# Patient Record
Sex: Female | Born: 1935
Health system: Southern US, Community
[De-identification: ages and names within clinical notes are randomized; demographics above are authoritative.]

## PROBLEM LIST (undated history)

## (undated) DIAGNOSIS — I509 Heart failure, unspecified: Secondary | ICD-10-CM

## (undated) DIAGNOSIS — C9110 Chronic lymphocytic leukemia of B-cell type not having achieved remission: Secondary | ICD-10-CM

## (undated) DIAGNOSIS — M25562 Pain in left knee: Secondary | ICD-10-CM

## (undated) DIAGNOSIS — I10 Essential (primary) hypertension: Secondary | ICD-10-CM

## (undated) DIAGNOSIS — D801 Nonfamilial hypogammaglobulinemia: Secondary | ICD-10-CM

## (undated) DIAGNOSIS — M1990 Unspecified osteoarthritis, unspecified site: Secondary | ICD-10-CM

## (undated) DIAGNOSIS — Z96659 Presence of unspecified artificial knee joint: Secondary | ICD-10-CM

## (undated) HISTORY — PX: CHOLECYSTECTOMY: SHX55

## (undated) HISTORY — DX: Chronic lymphocytic leukemia of B-cell type not having achieved remission: C91.10

## (undated) HISTORY — PX: APPENDECTOMY: SHX54

## (undated) HISTORY — PX: HERNIA REPAIR: SHX51

## (undated) HISTORY — DX: Nonfamilial hypogammaglobulinemia: D80.1

## (undated) HISTORY — PX: COLON SURGERY: SHX602

## (undated) HISTORY — PX: OTHER SURGICAL HISTORY: SHX169

---

## 1998-04-30 ENCOUNTER — Ambulatory Visit (HOSPITAL_BASED_OUTPATIENT_CLINIC_OR_DEPARTMENT_OTHER): Admission: RE | Admit: 1998-04-30 | Discharge: 1998-04-30 | Payer: Self-pay | Admitting: Orthopedic Surgery

## 1998-11-11 ENCOUNTER — Encounter: Payer: Self-pay | Admitting: Family Medicine

## 1998-11-11 ENCOUNTER — Ambulatory Visit (HOSPITAL_COMMUNITY): Admission: RE | Admit: 1998-11-11 | Discharge: 1998-11-11 | Payer: Self-pay | Admitting: Family Medicine

## 1999-07-27 ENCOUNTER — Encounter (INDEPENDENT_AMBULATORY_CARE_PROVIDER_SITE_OTHER): Payer: Self-pay | Admitting: Specialist

## 1999-07-27 ENCOUNTER — Ambulatory Visit (HOSPITAL_BASED_OUTPATIENT_CLINIC_OR_DEPARTMENT_OTHER): Admission: RE | Admit: 1999-07-27 | Discharge: 1999-07-27 | Payer: Self-pay | Admitting: Orthopedic Surgery

## 1999-10-27 ENCOUNTER — Encounter (INDEPENDENT_AMBULATORY_CARE_PROVIDER_SITE_OTHER): Payer: Self-pay

## 1999-10-27 ENCOUNTER — Ambulatory Visit (HOSPITAL_COMMUNITY): Admission: RE | Admit: 1999-10-27 | Discharge: 1999-10-27 | Payer: Self-pay | Admitting: Gastroenterology

## 2000-01-13 ENCOUNTER — Ambulatory Visit (HOSPITAL_COMMUNITY): Admission: RE | Admit: 2000-01-13 | Discharge: 2000-01-13 | Payer: Self-pay | Admitting: Gastroenterology

## 2000-03-11 ENCOUNTER — Encounter: Payer: Self-pay | Admitting: Family Medicine

## 2000-03-11 ENCOUNTER — Encounter: Admission: RE | Admit: 2000-03-11 | Discharge: 2000-03-11 | Payer: Self-pay | Admitting: Family Medicine

## 2000-09-08 ENCOUNTER — Encounter: Payer: Self-pay | Admitting: Surgery

## 2000-09-08 ENCOUNTER — Encounter: Payer: Self-pay | Admitting: Family Medicine

## 2000-09-08 ENCOUNTER — Inpatient Hospital Stay (HOSPITAL_COMMUNITY): Admission: RE | Admit: 2000-09-08 | Discharge: 2000-09-14 | Payer: Self-pay | Admitting: Family Medicine

## 2000-09-08 ENCOUNTER — Encounter (INDEPENDENT_AMBULATORY_CARE_PROVIDER_SITE_OTHER): Payer: Self-pay | Admitting: Specialist

## 2001-03-29 HISTORY — PX: BLEPHAROPLASTY: SUR158

## 2001-03-29 HISTORY — PX: ORIF HIP FRACTURE: SHX2125

## 2001-04-03 ENCOUNTER — Ambulatory Visit (HOSPITAL_COMMUNITY): Admission: RE | Admit: 2001-04-03 | Discharge: 2001-04-03 | Payer: Self-pay | Admitting: Family Medicine

## 2001-04-03 ENCOUNTER — Encounter: Payer: Self-pay | Admitting: Family Medicine

## 2001-04-18 ENCOUNTER — Ambulatory Visit (HOSPITAL_COMMUNITY): Admission: RE | Admit: 2001-04-18 | Discharge: 2001-04-18 | Payer: Self-pay | Admitting: Family Medicine

## 2001-05-23 ENCOUNTER — Encounter: Admission: RE | Admit: 2001-05-23 | Discharge: 2001-05-23 | Payer: Self-pay | Admitting: Family Medicine

## 2001-05-23 ENCOUNTER — Encounter: Payer: Self-pay | Admitting: Family Medicine

## 2001-06-06 ENCOUNTER — Ambulatory Visit (HOSPITAL_COMMUNITY): Admission: RE | Admit: 2001-06-06 | Discharge: 2001-06-06 | Payer: Self-pay | Admitting: Ophthalmology

## 2001-06-14 ENCOUNTER — Inpatient Hospital Stay (HOSPITAL_COMMUNITY): Admission: EM | Admit: 2001-06-14 | Discharge: 2001-06-19 | Payer: Self-pay | Admitting: Emergency Medicine

## 2001-06-14 ENCOUNTER — Encounter: Payer: Self-pay | Admitting: Emergency Medicine

## 2001-06-14 ENCOUNTER — Encounter: Payer: Self-pay | Admitting: Orthopedic Surgery

## 2001-06-19 ENCOUNTER — Inpatient Hospital Stay (HOSPITAL_COMMUNITY)
Admission: RE | Admit: 2001-06-19 | Discharge: 2001-06-27 | Payer: Self-pay | Admitting: Physical Medicine & Rehabilitation

## 2001-11-22 ENCOUNTER — Ambulatory Visit (HOSPITAL_BASED_OUTPATIENT_CLINIC_OR_DEPARTMENT_OTHER): Admission: RE | Admit: 2001-11-22 | Discharge: 2001-11-22 | Payer: Self-pay | Admitting: Orthopedic Surgery

## 2001-11-22 ENCOUNTER — Encounter: Payer: Self-pay | Admitting: Orthopedic Surgery

## 2002-10-11 ENCOUNTER — Other Ambulatory Visit: Admission: RE | Admit: 2002-10-11 | Discharge: 2002-10-11 | Payer: Self-pay | Admitting: Gynecology

## 2002-10-18 ENCOUNTER — Encounter: Payer: Self-pay | Admitting: Family Medicine

## 2002-10-18 ENCOUNTER — Ambulatory Visit (HOSPITAL_COMMUNITY): Admission: RE | Admit: 2002-10-18 | Discharge: 2002-10-18 | Payer: Self-pay | Admitting: Unknown Physician Specialty

## 2002-10-23 ENCOUNTER — Encounter: Payer: Self-pay | Admitting: Family Medicine

## 2002-10-23 ENCOUNTER — Encounter: Admission: RE | Admit: 2002-10-23 | Discharge: 2002-10-23 | Payer: Self-pay | Admitting: Family Medicine

## 2002-12-26 ENCOUNTER — Encounter: Payer: Self-pay | Admitting: Orthopedic Surgery

## 2003-01-01 ENCOUNTER — Encounter: Payer: Self-pay | Admitting: Orthopedic Surgery

## 2003-01-01 ENCOUNTER — Ambulatory Visit (HOSPITAL_COMMUNITY): Admission: RE | Admit: 2003-01-01 | Discharge: 2003-01-01 | Payer: Self-pay | Admitting: Orthopedic Surgery

## 2003-01-02 ENCOUNTER — Observation Stay (HOSPITAL_COMMUNITY): Admission: RE | Admit: 2003-01-02 | Discharge: 2003-01-03 | Payer: Self-pay | Admitting: Orthopedic Surgery

## 2003-10-23 ENCOUNTER — Other Ambulatory Visit: Admission: RE | Admit: 2003-10-23 | Discharge: 2003-10-23 | Payer: Self-pay | Admitting: Gynecology

## 2004-09-08 ENCOUNTER — Encounter: Admission: RE | Admit: 2004-09-08 | Discharge: 2004-09-08 | Payer: Self-pay | Admitting: Family Medicine

## 2006-03-29 HISTORY — PX: TOTAL KNEE ARTHROPLASTY: SHX125

## 2006-10-26 ENCOUNTER — Inpatient Hospital Stay (HOSPITAL_COMMUNITY): Admission: RE | Admit: 2006-10-26 | Discharge: 2006-10-31 | Payer: Self-pay | Admitting: Orthopedic Surgery

## 2008-02-06 ENCOUNTER — Ambulatory Visit: Payer: Self-pay | Admitting: Hematology & Oncology

## 2008-02-28 ENCOUNTER — Other Ambulatory Visit: Admission: RE | Admit: 2008-02-28 | Discharge: 2008-02-28 | Payer: Self-pay | Admitting: Hematology & Oncology

## 2008-02-28 ENCOUNTER — Encounter: Payer: Self-pay | Admitting: Hematology & Oncology

## 2008-02-28 LAB — CHCC SATELLITE - SMEAR

## 2008-02-28 LAB — CBC WITH DIFFERENTIAL (CANCER CENTER ONLY)
BASO#: 0.3 10*3/uL — ABNORMAL HIGH (ref 0.0–0.2)
HCT: 37.9 % (ref 34.8–46.6)
HGB: 12.7 g/dL (ref 11.6–15.9)
LYMPH#: 15.3 10*3/uL — ABNORMAL HIGH (ref 0.9–3.3)
LYMPH%: 76.5 % — ABNORMAL HIGH (ref 14.0–48.0)
MCHC: 33.4 g/dL (ref 32.0–36.0)
MCV: 83 fL (ref 81–101)
MONO#: 0.7 10*3/uL (ref 0.1–0.9)
NEUT%: 17.4 % — ABNORMAL LOW (ref 39.6–80.0)
RDW: 12.3 % (ref 10.5–14.6)
WBC: 20 10*3/uL — ABNORMAL HIGH (ref 3.9–10.0)

## 2008-03-01 LAB — SPEP & IFE WITH QIG
Albumin ELP: 64.2 % (ref 55.8–66.1)
Alpha-1-Globulin: 4.3 % (ref 2.9–4.9)
Beta 2: 4.1 % (ref 3.2–6.5)
Beta Globulin: 5.9 % (ref 4.7–7.2)
IgA: 79 mg/dL (ref 68–378)
Total Protein, Serum Electrophoresis: 6.3 g/dL (ref 6.0–8.3)

## 2008-03-01 LAB — COMPREHENSIVE METABOLIC PANEL
Albumin: 4.2 g/dL (ref 3.5–5.2)
Alkaline Phosphatase: 78 U/L (ref 39–117)
BUN: 23 mg/dL (ref 6–23)
CO2: 28 mEq/L (ref 19–32)
Calcium: 10.1 mg/dL (ref 8.4–10.5)
Chloride: 105 mEq/L (ref 96–112)
Glucose, Bld: 109 mg/dL — ABNORMAL HIGH (ref 70–99)
Potassium: 4.6 mEq/L (ref 3.5–5.3)
Sodium: 140 mEq/L (ref 135–145)
Total Protein: 6.3 g/dL (ref 6.0–8.3)

## 2008-03-01 LAB — BETA 2 MICROGLOBULIN, SERUM: Beta-2 Microglobulin: 2.5 mg/L — ABNORMAL HIGH (ref 1.01–1.73)

## 2008-03-01 LAB — LACTATE DEHYDROGENASE: LDH: 148 U/L (ref 94–250)

## 2008-03-01 LAB — DIRECT ANTIGLOBULIN TEST (NOT AT ARMC): DAT IgG: NEGATIVE

## 2008-03-01 LAB — FLOW CYTOMETRY - CHCC SATELLITE

## 2008-03-06 ENCOUNTER — Ambulatory Visit (HOSPITAL_BASED_OUTPATIENT_CLINIC_OR_DEPARTMENT_OTHER): Admission: RE | Admit: 2008-03-06 | Discharge: 2008-03-06 | Payer: Self-pay | Admitting: Hematology & Oncology

## 2008-03-06 ENCOUNTER — Ambulatory Visit: Payer: Self-pay | Admitting: Diagnostic Radiology

## 2008-05-29 ENCOUNTER — Ambulatory Visit: Payer: Self-pay | Admitting: Hematology & Oncology

## 2008-05-30 LAB — CHCC SATELLITE - SMEAR

## 2008-05-30 LAB — CBC WITH DIFFERENTIAL (CANCER CENTER ONLY)
HGB: 13.4 g/dL (ref 11.6–15.9)
MCH: 27.2 pg (ref 26.0–34.0)
MCV: 84 fL (ref 81–101)
Platelets: 131 10*3/uL — ABNORMAL LOW (ref 145–400)
RBC: 4.91 10*6/uL (ref 3.70–5.32)
WBC: 23.1 10*3/uL — ABNORMAL HIGH (ref 3.9–10.0)

## 2008-05-30 LAB — MANUAL DIFFERENTIAL (CHCC SATELLITE): PLT EST ~~LOC~~: ADEQUATE

## 2008-08-28 ENCOUNTER — Ambulatory Visit: Payer: Self-pay | Admitting: Hematology & Oncology

## 2008-08-29 LAB — CBC WITH DIFFERENTIAL (CANCER CENTER ONLY)
BASO%: 1.5 % (ref 0.0–2.0)
LYMPH%: 81.2 % — ABNORMAL HIGH (ref 14.0–48.0)
MCV: 84 fL (ref 81–101)
MONO#: 0.8 10*3/uL (ref 0.1–0.9)
Platelets: 163 10*3/uL (ref 145–400)
RDW: 12.5 % (ref 10.5–14.6)
WBC: 24.5 10*3/uL — ABNORMAL HIGH (ref 3.9–10.0)

## 2008-08-29 LAB — TECHNOLOGIST REVIEW CHCC SATELLITE

## 2008-12-13 ENCOUNTER — Ambulatory Visit: Payer: Self-pay | Admitting: Hematology & Oncology

## 2008-12-16 LAB — CBC WITH DIFFERENTIAL (CANCER CENTER ONLY)
BASO%: 2.1 % — ABNORMAL HIGH (ref 0.0–2.0)
EOS%: 0.8 % (ref 0.0–7.0)
Eosinophils Absolute: 0.2 10*3/uL (ref 0.0–0.5)
LYMPH%: 83.3 % — ABNORMAL HIGH (ref 14.0–48.0)
MCH: 28 pg (ref 26.0–34.0)
MCHC: 33.1 g/dL (ref 32.0–36.0)
MCV: 85 fL (ref 81–101)
MONO%: 2.7 % (ref 0.0–13.0)
Platelets: 134 10*3/uL — ABNORMAL LOW (ref 145–400)
RDW: 11.7 % (ref 10.5–14.6)

## 2009-01-02 ENCOUNTER — Ambulatory Visit: Payer: Self-pay | Admitting: Diagnostic Radiology

## 2009-01-02 ENCOUNTER — Ambulatory Visit (HOSPITAL_BASED_OUTPATIENT_CLINIC_OR_DEPARTMENT_OTHER): Admission: RE | Admit: 2009-01-02 | Discharge: 2009-01-02 | Payer: Self-pay | Admitting: Hematology & Oncology

## 2009-01-02 LAB — CMP (CANCER CENTER ONLY)
Albumin: 3.9 g/dL (ref 3.3–5.5)
Alkaline Phosphatase: 70 U/L (ref 26–84)
Glucose, Bld: 119 mg/dL — ABNORMAL HIGH (ref 73–118)
Potassium: 4.4 mEq/L (ref 3.3–4.7)
Sodium: 142 mEq/L (ref 128–145)
Total Protein: 6.8 g/dL (ref 6.4–8.1)

## 2009-01-14 ENCOUNTER — Ambulatory Visit: Payer: Self-pay | Admitting: Hematology & Oncology

## 2009-01-15 LAB — CBC WITH DIFFERENTIAL (CANCER CENTER ONLY)
BASO#: 0.7 10*3/uL — ABNORMAL HIGH (ref 0.0–0.2)
Eosinophils Absolute: 0.3 10*3/uL (ref 0.0–0.5)
HGB: 13 g/dL (ref 11.6–15.9)
LYMPH%: 83 % — ABNORMAL HIGH (ref 14.0–48.0)
MCH: 27.8 pg (ref 26.0–34.0)
MCHC: 33.1 g/dL (ref 32.0–36.0)
MCV: 84 fL (ref 81–101)
MONO%: 3.5 % (ref 0.0–13.0)
NEUT%: 10.3 % — ABNORMAL LOW (ref 39.6–80.0)
RBC: 4.69 10*6/uL (ref 3.70–5.32)

## 2009-01-15 LAB — TECHNOLOGIST REVIEW CHCC SATELLITE

## 2009-01-21 ENCOUNTER — Encounter: Admission: RE | Admit: 2009-01-21 | Discharge: 2009-01-21 | Payer: Self-pay | Admitting: Family Medicine

## 2009-03-06 ENCOUNTER — Ambulatory Visit: Payer: Self-pay | Admitting: Hematology & Oncology

## 2009-03-07 LAB — CBC WITH DIFFERENTIAL (CANCER CENTER ONLY)
BASO#: 0.5 10*3/uL — ABNORMAL HIGH (ref 0.0–0.2)
Eosinophils Absolute: 0.2 10*3/uL (ref 0.0–0.5)
HCT: 37.6 % (ref 34.8–46.6)
HGB: 12.7 g/dL (ref 11.6–15.9)
LYMPH#: 23.8 10*3/uL — ABNORMAL HIGH (ref 0.9–3.3)
MCH: 27.5 pg (ref 26.0–34.0)
NEUT#: 3.1 10*3/uL (ref 1.5–6.5)
NEUT%: 10.8 % — ABNORMAL LOW (ref 39.6–80.0)
RBC: 4.6 10*6/uL (ref 3.70–5.32)

## 2009-03-07 LAB — CHCC SATELLITE - SMEAR

## 2009-05-16 ENCOUNTER — Ambulatory Visit (HOSPITAL_COMMUNITY): Admission: RE | Admit: 2009-05-16 | Discharge: 2009-05-16 | Payer: Self-pay | Admitting: Family Medicine

## 2009-06-18 ENCOUNTER — Ambulatory Visit: Payer: Self-pay | Admitting: Hematology & Oncology

## 2009-06-19 LAB — CBC WITH DIFFERENTIAL (CANCER CENTER ONLY)
BASO#: 0.9 10*3/uL — ABNORMAL HIGH (ref 0.0–0.2)
BASO%: 2.5 % — ABNORMAL HIGH (ref 0.0–2.0)
HGB: 13.1 g/dL (ref 11.6–15.9)
LYMPH%: 83.4 % — ABNORMAL HIGH (ref 14.0–48.0)
MCHC: 33.1 g/dL (ref 32.0–36.0)
MONO#: 1.4 10*3/uL — ABNORMAL HIGH (ref 0.1–0.9)
NEUT%: 9.4 % — ABNORMAL LOW (ref 39.6–80.0)
Platelets: 124 10*3/uL — ABNORMAL LOW (ref 145–400)
RBC: 4.65 10*6/uL (ref 3.70–5.32)

## 2009-06-19 LAB — CHCC SATELLITE - SMEAR

## 2009-08-07 ENCOUNTER — Ambulatory Visit: Payer: Self-pay | Admitting: Hematology & Oncology

## 2009-08-14 LAB — CBC WITH DIFFERENTIAL (CANCER CENTER ONLY)
BASO#: 0.9 10*3/uL — ABNORMAL HIGH (ref 0.0–0.2)
BASO%: 2.5 % — ABNORMAL HIGH (ref 0.0–2.0)
EOS%: 0.5 % (ref 0.0–7.0)
Eosinophils Absolute: 0.2 10*3/uL (ref 0.0–0.5)
LYMPH#: 31 10*3/uL — ABNORMAL HIGH (ref 0.9–3.3)
LYMPH%: 85.4 % — ABNORMAL HIGH (ref 14.0–48.0)
MCH: 28.1 pg (ref 26.0–34.0)
MCHC: 33.1 g/dL (ref 32.0–36.0)
MCV: 85 fL (ref 81–101)
MONO#: 0.9 10*3/uL (ref 0.1–0.9)
NEUT%: 9.1 % — ABNORMAL LOW (ref 39.6–80.0)
RDW: 12.2 % (ref 10.5–14.6)
WBC: 36.3 10*3/uL — ABNORMAL HIGH (ref 3.9–10.0)

## 2009-10-01 ENCOUNTER — Ambulatory Visit: Payer: Self-pay | Admitting: Vascular Surgery

## 2009-10-09 ENCOUNTER — Ambulatory Visit: Payer: Self-pay | Admitting: Hematology & Oncology

## 2009-10-23 LAB — CBC WITH DIFFERENTIAL (CANCER CENTER ONLY)
BASO%: 2.1 % — ABNORMAL HIGH (ref 0.0–2.0)
EOS%: 1 % (ref 0.0–7.0)
HGB: 13 g/dL (ref 11.6–15.9)
MCH: 28.2 pg (ref 26.0–34.0)
MCV: 84 fL (ref 81–101)
MONO%: 2.8 % (ref 0.0–13.0)
NEUT%: 9 % — ABNORMAL LOW (ref 39.6–80.0)
RDW: 12.4 % (ref 10.5–14.6)

## 2010-01-07 ENCOUNTER — Ambulatory Visit: Payer: Self-pay | Admitting: Hematology & Oncology

## 2010-01-08 LAB — CBC WITH DIFFERENTIAL (CANCER CENTER ONLY)
BASO#: 0.6 10*3/uL — ABNORMAL HIGH (ref 0.0–0.2)
BASO%: 1.9 % (ref 0.0–2.0)
EOS%: 0.9 % (ref 0.0–7.0)
Eosinophils Absolute: 0.3 10*3/uL (ref 0.0–0.5)
HGB: 13.4 g/dL (ref 11.6–15.9)
LYMPH#: 25.8 10*3/uL — ABNORMAL HIGH (ref 0.9–3.3)
LYMPH%: 83 % — ABNORMAL HIGH (ref 14.0–48.0)
MCV: 85 fL (ref 81–101)
NEUT#: 3.3 10*3/uL (ref 1.5–6.5)
NEUT%: 10.7 % — ABNORMAL LOW (ref 39.6–80.0)
RBC: 4.82 10*6/uL (ref 3.70–5.32)
RDW: 12.4 % (ref 10.5–14.6)
WBC: 31.1 10*3/uL — ABNORMAL HIGH (ref 3.9–10.0)

## 2010-05-28 ENCOUNTER — Other Ambulatory Visit: Payer: Self-pay | Admitting: Hematology & Oncology

## 2010-05-28 ENCOUNTER — Encounter (HOSPITAL_BASED_OUTPATIENT_CLINIC_OR_DEPARTMENT_OTHER): Payer: Medicare Other | Admitting: Hematology & Oncology

## 2010-05-28 LAB — CHCC SATELLITE - SMEAR

## 2010-05-28 LAB — CBC WITH DIFFERENTIAL (CANCER CENTER ONLY)
BASO%: 0.3 % (ref 0.0–2.0)
HGB: 12.5 g/dL (ref 11.6–15.9)
LYMPH#: 38.2 10*3/uL — ABNORMAL HIGH (ref 0.9–3.3)
LYMPH%: 89.4 % — ABNORMAL HIGH (ref 14.0–48.0)
MONO#: 0.6 10*3/uL (ref 0.1–0.9)
MONO%: 1.5 % (ref 0.0–13.0)
NEUT#: 3.5 10*3/uL (ref 1.5–6.5)
NEUT%: 8.2 % — ABNORMAL LOW (ref 39.6–80.0)
Platelets: 126 10*3/uL — ABNORMAL LOW (ref 145–400)
RDW: 14.6 % (ref 11.1–15.7)

## 2010-08-11 NOTE — Consult Note (Signed)
NEW PATIENT CONSULTATION   [name], [name]  DOB:  03/12/1929                                       10/01/2009  ZOXWR#:[id number]   The patient presents today for evaluation of right leg discomfort.  She  is an active 77 year old white female who over the past several weeks  has had pain in her right posterior calf.  She reports that this has  been slowly resolving with time.  She does not have any history of DVT.  She did undergo a rule out DVT study on 01/21/2009.  This showed no  evidence of lower extremity DVT at that time.  She has had continued  discomfort in the posterior aspect and wished to be seen for further  evaluation.  She has orthopedic issues regarding both lower extremities.  She had knee difficulty and significant left hip surgery with multiple  revisions.  She does have some chronic lower extremity swelling.  She  does not have any history of bleeding from her varicosities.  She is on  diuretic therapy for her lower extremity swelling.   PAST MEDICAL HISTORY:  Significant for osteoarthritis, lower extremity  edema, colitis, aortic valve sclerosis, rectal cancer, cystocele,  glaucoma, pacemaker, chronic lymphocytic leukemia, hypertension.   She has no known drug allergies.   REVIEW OF SYSTEMS:  Is documented in her chart and is negative.  From a  cardiac standpoint she does have some shortness of breath with exertion.  GI:  Positive for constipation.  GU:  Negative.  VASCULAR:  Negative.  MUSCULOSKELETAL:  Positive for arthritis, joint pain, muscle pain.  Psychiatric, ENT, hematology and other reviewed are normal.   SOCIAL HISTORY:  She is married.  She is retired.  She has five  children.  She does not smoke, did in the remote past.  Does not drink  alcohol.   PHYSICAL EXAM:  Vital signs:  Blood pressure 150/90, pulse 86,  respirations 18.  General:  She is in no acute distress.  HEENT:  Normal.  Musculoskeletal:  Shows prior incisions  from knee surgery.  No  major deformities or cyanosis.  Neurological:  She has no focal weakness  or paresthesias.  Skin:  Without ulcers or rashes.  She does have  thickening and varicosities in her right posterior calf down towards her  ankle.  On the left she has varicosities in her calf with no evidence of  thickening.   She underwent repeat noninvasive study in our office today which I  ordered and interpreted.  This reveals no evidence of great saphenous  vein reflux.  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  I have discussed the significance of this with the patient.  I explained  that this is not dangerous and does not put her at increased risk for  DVT.  I explained the treatment is as has been appropriately recommended  with elevation and nonsteroidals.  She has attempted compression  garments in the past and has a very difficult time with discomfort and  difficulty wearing these.  She was reassured that this is expected  treatment of her superficial thrombophlebitis and will see [place] again on  an as-needed basis.     Larina Earthly, M.D.  Electronically Signed  TFE/MEDQ  D:  10/01/2009  T:  10/02/2009  Job:  [id number]   cc:   Holley Bouche, M.D.

## 2010-08-11 NOTE — Op Note (Signed)
[name], [name] NO.:  [id number]   MEDICAL RECORD NO.:  [id number]          PATIENT TYPE:  INP   LOCATION:  1604                         FACILITY:  [facility]   PHYSICIAN:  Ollen Gross, M.D.    DATE OF BIRTH:  1928-12-10   DATE OF PROCEDURE:  10/26/2006  DATE OF DISCHARGE:                               OPERATIVE REPORT   PREOPERATIVE DIAGNOSIS:  Osteoarthritis left knee.   POSTOPERATIVE DIAGNOSIS:  Osteoarthritis left knee.   PROCEDURE:  Left total knee arthroplasty.   SURGEON:  Ollen Gross, M.D.   ASSISTANT:  Avel Peace PA-C   ANESTHESIA:  General with postop Marcaine pain pump.   ESTIMATED BLOOD LOSS:  Minimal.   DRAIN:  None.   TOURNIQUET TIME:  44 minutes at 300 mmHg.   COMPLICATIONS:  None.   CONDITION:  Stable to recovery.   BRIEF CLINICAL NOTE:  [name] is a 77 year old female with severe  end-stage arthritis of the left knee with progressively worsening pain  and dysfunction.  She has failed nonoperative management including  injection and presents for total knee arthroplasty.   PROCEDURE IN DETAIL:  After successful administration of general  anesthetic, a tourniquet placed on the left thigh and left lower  extremity is prepped and draped in the usual sterile fashion.  Extremity  is wrapped in Esmarch, knee flexed, tourniquet inflated to 300 mmHg.  Midline incision made with a 10 blade through subcutaneous tissue to the  level of the extensor mechanism.  Fresh blade is used to make a medial  parapatellar arthrotomy.  Soft tissue over the proximal medial tibia is  subperiosteally elevated to the joint line with the knife into the  semimembranosus bursa with a Cobb elevator.  Soft tissue laterally was  elevated with attention being paid to avoiding patellar tendon on tibial  tubercle.  Patella subluxed laterally, knee flexed 90 degrees, ACL and  PCL removed.  Drill was used to create a starting hole in the distal  femur and canal  was thoroughly irrigated.  A 5 degree left valgus  alignment guide is placed referencing off the posterior condyles,  rotations marked and block pinned to remove 10 mm off the distal femur.  Distal femoral resection is made an oscillating saw.  Sizing blocks  placed and size 4 is most appropriate.  Rotations marked at epicondylar  axis.  Size 4 cutting block is placed and the anterior-posterior chamfer  cuts were made.   Tibia subluxed forward menisci removed.  Extramedullary tibial alignment  guide is placed referencing proximally at the medial aspect of the  tibial tubercle and distally along the second metatarsal axis and tibial  crest.  There is deficiency on both sides.  We pinned the block to  remove about 4 mm of the more deficient medial side.  Tibial resection  is made with an oscillating saw.  Size 3 is the most appropriate tibial  component and proximal tibia is prepared with the modular drill and keel  punch for size 3.  Femoral preparation is completed with the  intercondylar cut for the size 4.  Size 3 mobile bearing tibial trial, size 4 posterior stabilized femoral  trial and a 10-mm posterior stabilized rotating platform insert trial  are placed.  With a 10, full extensions achieved with excellent varus  and valgus balance throughout full range of motion.  Patella was then  everted, thickness measured to be 21 mm.  Freehand resection is taken to  12 mm, 35 template is placed, lug holes were drilled, trial patella is  placed and it tracks normally.  Osteophytes removed off the posterior  femur with the trial in place.  All trials are removed and the cut bone  surfaces are prepared with pulsatile lavage.  Cement was mixed and once  ready for implantation the size 3 mobile bearing tibial tray, size 4  posterior stabilized femur and 35 patella are cemented into place.  Patella was held the clamp.  Trial 10-mm inserts placed, knee held in  full extension, all extruded cement  removed.  When the cement is fully  hardened, then the wound is copiously irrigated with saline solution and  FloSeal injected on the posterior capsule.  The permanent 10 mm  posterior stabilized rotating platform insert is placed in the tibial  tray.  The FloSeal is then injected in the medial and lateral gutters  and suprapatellar area.  Tourniquet was then released with total time of  44 minutes.  We held pressure for two minutes and then any minor  bleeding stopped with cautery.  Wound was again irrigated and the  extensor mechanism closed with interrupted #1 PDS.  Flexion against  gravity to 125 degrees.  Subcu closed with interrupted 2-0 Vicryl,  subcuticular running 4-0 Monocryl.  Incisions cleaned and dried and  Steri-Strips applied.  The catheter for Marcaine pain pump was placed  and the pump initiated.  Bulky sterile dressing is placed and she is put  into a knee immobilizer, awakened, transferred to recovery in stable  condition.      Ollen Gross, M.D.  Electronically Signed     FA/MEDQ  D:  10/26/2006  T:  10/27/2006  Job:  [id number]

## 2010-08-11 NOTE — Discharge Summary (Signed)
[name], [name]               ACCOUNT NO.:  [id number]   MEDICAL RECORD NO.:  [id number]          PATIENT TYPE:  INP   LOCATION:  1604                         FACILITY:  [facility]   PHYSICIAN:  Ollen Gross, M.D.    DATE OF BIRTH:  06-27-1928   DATE OF ADMISSION:  10/26/2006  DATE OF DISCHARGE:  10/31/2006                               DISCHARGE SUMMARY   ADMITTING DIAGNOSES:  1. Osteoarthritis of left knee.  2. Peripheral edema.  3. Hypertension.  4. Degenerative disk disease.  5. History of colon cancer, status post surgery.   DISCHARGE DIAGNOSES:  1. Osteoarthritis of left knee, status post left total knee      replacement and arthroplasty.  2. Postoperative blood loss anemia, status post transfusion without      sequelae.  3. Mild postoperative hyponatremia, improved.  4. Peripheral edema.  5. Hypertension.  6. Degenerative disk disease.  7. History of colon cancer, status post surgery.   PROCEDURE:  On October 26, 2006, left total knee.  Surgeon, Dr. Lequita Halt.  Assistant, Alexzandrew L. Perkins, P.A.C.  Anesthesia general.   CONSULTATIONS:  None.   BRIEF HISTORY:  [name] is a 77 year old female with severe end-  stage arthritis of the left knee with progressive worsening pain and  dysfunction, who failed outpatient management including injections, who  now presents for total knee arthroplasty.   LABORATORY DATA:  Preop CBC showed hemoglobin 12 and hematocrit 38.3.  Chemistry panel on admission showed slightly elevated calcium of 10.4.  Remaining chemistry panel all within normal limits.  Serial CBCs were  followed.  Hemoglobin did drop down to 9.7 and got as low as 8.2.  Given  blood and post transfusion hemoglobin 9.6 with the last H&H 9.7 and  28.5.  PT/PTT on admission 13.2 and 29 respectively.  INR 1.  Serial pro  times were followed which showed PT and INR 15.4 and 1.2  Serial  albumins were followed.  Sodium did drop down to 134 and back up to 135,  improving by the time of discharge.  Preop UA showed positive nitrite,  small leukocyte esterase with only 0-2 white cells, 0-2 red cells and  many bacteria, otherwise negative.  Blood group type O negative.   EKG on October 21, 2006, showed normal sinus rhythm and borderline criteria  for left ventricular hypertrophy.  Two-view chest on October 21, 2006,  showed COPD without acute chest process, stable exam.   HOSPITAL COURSE:  The patient was admitted to [facility],  tolerated the procedure well and later transferred to the recovery room,  then the orthopedic floor and started on PCA and p.o. analgesic pain  control following surgery.  Seen on rounds by Dr. Lequita Halt and had  adequate output, but it was on the lower side.  She was on  hydrochlorothiazide as a blood pressure medication.  That was resumed.  Iron was started because of a low hemoglobin of 9.7 and monitored  pressures closely, and also the output.  Encouraged p.o. medications and  encouraged PCA as a backup only.  By day two, she was  doing a little  better.  Unfortunately the hemoglobin got a little bit lower to 8.2.  She was given blood and tolerated the blood well.  Blood pressure was  stable and her output picked up.  She had excellent diuresis of  postoperative fluids, started getting up and out of bed, walking short  distances of about 12 feet.  Dressing was changed on day two also.  The  incision looked good.  By day three, she was feeling much better.  She  was running some temperature.  Encouraged incentive spirometer, coughing  and deep breathing with antipyretics.  She started getting up and moving  a little bit better, walking about 100 feet and continued to progress  well.  By day four, temperature was back down.  She continued to  ambulate well and that was the reason that she was hurting a little bit  more on postoperative day five, October 31, 2006.  She had done a lot of  therapy.  Encouraged p.o.  medications and once the pain was under better  control, we changed the p.o. medications over to Dilaudid.  She got a  little bit better control with this and was discharged home.   DISCHARGE/PLAN:  1. The patient was discharged home on October 31, 2006.  2. Discharge diagnoses, please see above.  3. Discharge medications:  Dilaudid, Robaxin, Nu-Iron, Lovenox and      Coumadin.  4. Follow up in 2 weeks.   DISPOSITION:  Home.   DISCHARGE ACTIVITIES:  1. Weightbearing as tolerated.  2. Home PT and home health nursing.  3. Total knee protocol.   CONDITION ON DISCHARGE:  Improved.      Alexzandrew L. Perkins, P.A.C.      Ollen Gross, M.D.  Electronically Signed    ALP/MEDQ  D:  11/29/2006  T:  11/29/2006  Job:  [id number]   cc:   Tally Joe, M.D.  Fax: [phone]   Ollen Gross, M.D.  Fax: [phone]

## 2010-08-11 NOTE — H&P (Signed)
NAMESHARALEE, WITMAN NO.:  [id number]   MEDICAL RECORD NO.:  [id number]          PATIENT TYPE:  INP   LOCATION:  1604                         FACILITY:  [facility]   PHYSICIAN:  Ollen Gross, M.D.    DATE OF BIRTH:  Feb 18, 1929   DATE OF ADMISSION:  10/26/2006  DATE OF DISCHARGE:                              HISTORY & PHYSICAL   CHIEF COMPLAINT:  Left knee pain.  __________ as a second opinion back  in 2004.   HISTORY OF PRESENT ILLNESS:  Patient is a 77 year old female who has  been seen by Dr. Lequita Halt for ongoing left knee pain.  She has been seen  in the office for progressive complaints and progressive symptoms.  She  has been found to have end-stage arthritis of the left knee and it is  felt that, due to her progressive symptoms, she would benefit from  undergoing knee replacement.  Risks and benefits have been discussed.  She elected to have surgery.   ALLERGIES:  NO KNOWN DRUG ALLERGIES.   CURRENT MEDICATIONS:  Water pill, ibuprofen, occasional aspirin.   PAST MEDICAL HISTORY:  Peripheral edema, hypertension, history of colon  cancer, and degenerative disk disease.   PAST SURGICAL HISTORY:  She has had hip surgery x3, cholecystectomy,  appendectomy, bunion surgery.  She has also had colon surgery secondary  to colon cancer with a colostomy and then later a reversal of the  colostomy.  Also, bilateral cataract surgery.   SOCIAL HISTORY:  Married, retired, nonsmoker, occasional intake of  alcohol, 5 children.  Husband, daughter, and son will be assisting with  care after surgery.   FAMILY HISTORY:  Father with history of heart disease.  Mother with a  history of stroke.   REVIEW OF SYSTEMS:  GENERAL:  No fever, chills, or night sweats.  NEURO:  No seizure, syncope, paralysis.  RESPIRATORY:  No shortness of breath.  No cough or hemoptysis.  CARDIOVASCULAR:  No chest pain, angina,  orthopnea.  GI:  No nausea, vomiting, diarrhea, or constipation.  GU:  No dysuria, hematuria, or discharge.  MUSCULOSKELETAL:  Left knee.   PHYSICAL EXAMINATION:  VITAL SIGNS:  Pulse 78, respirations 14, blood  pressure 142/80.  GENERAL:  A 77 year old white female well-nourished, well-developed,  overweight, in no acute distress.  She is alert, oriented, cooperative,  accompanied by her husband.  HEENT:  Normocephalic, atraumatic.  Pupils are round and reactive.  Oropharynx clear.  EOMs intact.  She does have both upper and lower  denture plates.  Small sore around the lower plate.  NECK:  Trace bilateral bruits.  CHEST:  Clear anterior and posterior chest walls, no rhonchi, rales, or  wheezing.  HEART:  Regular rate and rhythm with a grade 2/6 systolic ejection  murmur best heard over aortic, but also noted at pulmonic and Erb's  point.  ABDOMEN:  Soft, protuberant abdomen, round.  Bowel sounds present.  RECTAL, BREASTS, GENITALIA:  Not done, not pertinent to present illness.  EXTREMITIES:  Left lower extremity:  Left knee: Marked crepitus is  noted.  Varus malalignment deformity.  No instability.  Range of motion  5-95.   IMPRESSION:  1. Osteoarthritis, left knee.  2. Peripheral edema.  3. Hypertension.  4. Back degenerative disk disease.  5. History of colon cancer, status post surgery.   PLAN:  Patient admitted to [facility] to undergo a left total  knee replacement arthroplasty.  Surgery will be performed by Dr. Ollen Gross.      Alexzandrew L. Perkins, P.A.C.      Ollen Gross, M.D.  Electronically Signed    ALP/MEDQ  D:  10/26/2006  T:  10/27/2006  Job:  [id number]   cc:   Tally Joe, M.D.  Fax: [phone]   Ollen Gross, M.D.  Fax: [phone]

## 2010-08-11 NOTE — Procedures (Signed)
LOWER EXTREMITY VENOUS REFLUX EXAM   INDICATION:  Raised, painful varicose veins.   EXAM:  Using color-flow imaging and pulse Doppler spectral analysis, the  right common femoral, superficial femoral, popliteal, posterior tibial,  greater and lesser saphenous veins are evaluated.  There is no evidence  suggesting deep venous insufficiency in the right lower extremity.   The right saphenofemoral junction is competent.  The right GSV is  competent.   The right proximal short saphenous vein demonstrates incompetency.   The smaller saphenous vein proximal is 0.44 cm.   The distal portion is 0.34.   GSV Diameter (used if found to be incompetent only)                                            Right    Left  Proximal Greater Saphenous Vein           cm       cm  Proximal-to-mid-thigh                     cm       cm  Mid thigh                                 cm       cm  Mid-distal thigh                          cm       cm  Distal thigh                              cm       cm  Knee                                      cm       cm   IMPRESSION:  1. The right greater saphenous vein is competent.  2. The right greater saphenous vein is not aneurysmal.  3. The right greater saphenous vein is not tortuous.  4. The deep venous system is competent  5. The right lesser saphenous vein is not competent with reflux of      >500 mm per second.  6. The mid smaller saphenous vein has an area where there is evidence      of thrombosis.   ___________________________________________  Larina Earthly, M.D.   NT/MEDQ  D:  10/01/2009  T:  10/01/2009  Job:  [id number]

## 2010-08-14 NOTE — Op Note (Signed)
NAMESHEBRA, MULDROW                           ACCOUNT NO.:  [id number]   MEDICAL RECORD NO.:  [id number]                   PATIENT TYPE:  AMB   LOCATION:  DSC                                  FACILITY:  [facility]   PHYSICIAN:  Mila Homer. Sherlean Foot, M.D.              DATE OF BIRTH:  October 09, 1928   DATE OF PROCEDURE:  11/22/2001  DATE OF DISCHARGE:  11/22/2001                                 OPERATIVE REPORT   PREOPERATIVE DIAGNOSIS:  Painful hardware, left hip, status post hip screw  fixation of an intertrochanteric fracture.   POSTOPERATIVE DIAGNOSIS:  Painful hardware, left hip, status post hip screw  fixation of an intertrochanteric fracture.   PROCEDURE:  Interchange and open reduction internal fixation of hip screw  hardware.   SURGEON:  Mila Homer. Sherlean Foot, M.D.   ASSISTANT:  None.   ANESTHESIA:  General.   INDICATIONS FOR PROCEDURE:  The patient is an elderly white female status  post intertrochanteric subtrochanteric fracture with Dynamic hip screw  placement.  She has been having trochanteric bursitis and back pain, and is  recalcitrant to conservative measures.  Informed consent was obtained.   DESCRIPTION OF PROCEDURE:  The patient was taken to the operating room and  administered general anesthesia.  The left hip area was prepped and draped  in the usual sterile fashion.  Previous incision was used, made with a #10  blade.  A clean blade was used to go down to and through the fascia lata and  the vastus lateralis was elevated anteriorly.  I used a Cobb elevator to  clean off the plate and then removed the 4 bicortical screws and the plate.  I then placed a 2.0 mm guide wire up the screw, removed the screw with the  screwdriver, and this measured a 110 mm lag screw.  I then placed a 90 mm  lag screw up the femoral neck and head in the same center position,  verifying on AP and lateral C-arm imaging.  I then placed the same plate  over that screw and used the same size 4  screw, bicortical screws.  I  verified on AP and lateral fixation.  There was a shift of the trochanter  laterally and it appeared that the subtrochanteric portion of the fracture  had shifted some medially, but I tried to evaluate under C-arm imaging and  it seemed like the fracture had healed.  At this point, the wound was  cleansed and then the fascia lata was closed with interrupted #1 Vicryls,  the deep soft tissue with interrupted 0 Vicryls, the subcuticular with 2-0  Vicryl and skin staples.  The wound was dressed with Xeroform dressing and  sterile Ioban drape.   COMPLICATIONS:  None.   DRAINS:  None.   ESTIMATED BLOOD LOSS:  100 cc.  Mila Homer. Sherlean Foot, M.D.   SDL/MEDQ  D:  11/22/2001  T:  11/24/2001  Job:  [id number]

## 2010-08-14 NOTE — H&P (Signed)
[facility]  Patient:    [name], [name]                        MRN: [id number] Adm. Date:  [id number] Attending:  Charlton Haws CC:         Elana Alm. Eliezer Lofts., M.D.   History and Physical  ACCOUNT NUMBER:  [id number]  CHIEF COMPLAINT:  Abdominal pain.  HISTORY OF PRESENT ILLNESS:  Ms. Kaczynski was in her usual state of good health until about 48 oclock this morning when she acute onset of epigastric and right upper quadrant pain, which radiated into her back and was associated with a fair amount of nausea.  Over the day she has improved somewhat, but still uncomfortable, particularly in the back area.  The nausea has improved. She apparently contacted Dr. Nicholos Johns and ultrasound was obtained of the gallbladder.  This shows a 1.8 cm stone in the neck of the gallbladder without evidence of gallbladder wall thickening at the present time.  She is noted to be tender over the gallbladder on ultrasound.  Because of this, she called and asked [place] to see her and we are seeing her in the emergency room.  The patient relates that she really has had very minimal GI complaints in terms of symptoms recently.  She is able to eat pretty much what she wants without significant nausea or vomiting, indigestion, fatty food intolerance, etc.  PAST OPERATIONS:  She has had partial colectomy, apparently with a colostomy and take down of the colostomy reanastomosis from the right upper quadrant as well as a perforated appendix.  ALLERGIES:  No known drug allergies.  MEDICATIONS:  Canksa suppositories.  SOCIAL HISTORY:  Smokes none and alcohol none.  FAMILY HISTORY:  Unremarkable.  REVIEW OF SYSTEMS:  HEENT:  Negative.  CHEST:  No cough or shortness of breath.  HEART:  No history or murmurs or hypertension.  EXTREMITIES:  She does have some peripheral edema of the lower extremities and apparently recently had a Doppler study that was negative for DVT.  PHYSICAL  EXAMINATION:  GENERAL:  The patient is a healthy, alert female who is in no distress.  VITAL SIGNS:  Unremarkable and noted in admission note.  HEENT:  Head is normocephalic.  EYES:  Pupils round and regular, nonicteric.  NECK:  Supple, no masses or thyromegaly.  Mucous membranes are moist.  LUNGS:  Clear to auscultation.  HEART:  Regular without murmur, rub or gallop.  ABDOMEN:  Long midline scar, well-healed, right upper quadrant scar and right lower quadrant scar.  There is minimal to no tenderness in the right upper quadrant and remainder of the abdomen is completely soft and nontender.  There is no hepatosplenomegaly.  Bowel sounds are normal.  She does have some evidence of peripheral edema, not particularly pitting, more of a brawny edema.  I do feel posterior tibial pulses.  LABORATORY:  Normal electrolytes and normal liver functions with a total bilirubin of 0.8.  White count 6600 with hemoglobin of 13.6.  Amylase normal at 36.  X-RAYS:  EKG normal sinus rhythm with some voltage criteria for left ventricular hypertrophy.  Chest x-ray does show possibly mild cardiomegaly. (The patient does relate that she has been told in the past that she has an enlarged heart, but has not been treated for that and, as noted above, has not had any angina-type symptoms, etc.)  IMPRESSION:  Acute biliary colic, cholelithiasis, pending acute cholecystitis.  PLAN:  Admit her tonight, start her on IV fluids and antibiotics.  Would like to see if we can get a cholecystectomy done on her tomorrow before she becomes more symptomatic with the stone wedged in her cystic duct.  She is planning a trip next week to [place] for her sons wedding, and I am concerned that we cannot put her off long enough to get through that, and therefore would like to proceed fairly soon.  I have discussed with her the indication, risks, and complications of surgery, including the fact that this may well not be  able to be done laparoscopically because of her prior surgery and may need to be done open, and that there is significant risk to bowel injury as well as bleeding, infection, liver and bile duct injuries.  All questions have been answered and she is ready to proceed.  She understands that most likely one of my associates will be doing the surgery and not me, as we are going to try to find a time on the schedule in mid morning when I will be unavailable, but Dr. Orpah Greek has been her physician in the past, and she understands that we occasionally have to have other physicians take care of patients within the practice. DD:  09/08/00 TD:  09/08/00 Job: [id number]

## 2010-08-14 NOTE — Op Note (Signed)
[facility]  Patient:    [name], [name]                        MRN: [id number] Proc. Date: 09/09/00 Adm. Date:  [id number] Attending:  Charlton Haws                           Operative Report  PREOPERATIVE DIAGNOSIS:  Symptomatic cholelithiasis.  POSTOPERATIVE DIAGNOSIS:  Symptomatic cholelithiasis.  PROCEDURE: 1. Laparoscopic converted to open cholecystectomy. 2. Repair of enterotomy.  SURGEON:  Abigail Miyamoto, M.D.  ASSISTANT:  Sheppard Plumber. Earlene Plater, M.D.  ANESTHESIA:  General endotracheal.  INDICATIONS:  [name] is a 77 year old female, who presented with abdominal pain.  She was found on ultrasound to have a large gallstone impacted in the gallbladder neck.  Therefore, the decision was made to proceed with cholecystectomy.  The patient has had multiple prior abdominal explorations.  The risks of converting to an open procedure were discussed with the patient.  FINDINGS:  The patient was found to have dense adhesions in the entire right side of the abdomen.  During the dissection, the question of an enterotomy was made.  Because of the dense adhesions as well as this, the procedure was converted to an open procedure.  A small enterotomy was identified and a knuckle of bowel stuck to the prior colostomy site.  This was repaired with interrupted sutures.  PROCEDURE IN DETAIL:  The patient was brought to the operating room and identified as [name].  She was placed supine on the operating room table, and general anesthesia was induced.  Her abdomen was then prepped and draped in the usual sterile fashion.  A small vertical incision was made just to the right side of the umbilicus.  Incision was carried down to the fascia which was opened with the scalpel.  A hemostat was then used to pass into the peritoneal cavity.  A finger was inserted, and a clear space was identified. The patient was found to have a large amount of  adhesions in the right upper quadrant.  The Hasson port was then placed at the opening, and insufflation of the abdomen was begun.  Upon inserting the camera, the patient was found to have dense adhesions throughout the abdomen.  A space could be visualized in the left upper quadrant just lateral to the midline.  A separate skin incision was made up here, and an 11 mm port was placed under direct vision.  A separate 5 mm port was then also placed in the left upper quadrant and take-down of adhesions was commenced.  The patient had a large amount of omentum and bowel stuck to the midline as well as the right upper quadrant. Extensive lysis of adhesions was then carried out.  The patient was found to have two knuckles of bowel stuck to the colostomy site.  During attempts at take-down of these, there was a question whether the bowel was entered. Further examination of the right upper quadrant revealed dense adhesions all the way up to the liver.  At this point, the decision was made to convert to an open procedure to examine the bowel and to remove the gallbladder.  A subcostal incision was then made on the right side with #10 scalpel.  Incision was carried down through the fascia and the muscles with the electrocautery. The perineum was then opened the entire length of the incision.  The knuckle  of small bowel at the colostomy site was then identified and taken down with the Metzenbaum scissors.  During this take-down, a small enterotomy was created.  The original site of an enterotomy was found to be consistent more with a serosal injury.  At this point, the enterotomy and the serosal injury were closed with interrupted 3-0 Vicryl sutures.  Attention was then turned toward the gallbladder.  The patient was found to have dense adhesions of omentum and bowel to the gallbladder and liver.  These were taken down with the Metzenbaum scissors.  The gallbladder was then finally identified  and taken down from the liver in a dome-down approach with the electrocautery. The base of the gallbladder was then identified.  The cystic stump was identified and clipped twice proximally and distally and transected with the scissors.  The cystic artery was also identified and clipped twice, once proximally and once distally and transected as well.  The gallbladder was the completely removed from the field.  Hemostasis was then achieved in the liver bed.  The abdomen was then copiously irrigated with normal saline.  The posterior fascia was then closed with a running #1 PDS suture.  The anterior fascia was likewise closed with a running #1 PDS suture as well.  The skin was then thoroughly irrigated.  The skin was then closed with skin staples.  The 0 Vicryl at the umbilical incision was likewise closed.  These incisions were also closed with skin staples.  The patient tolerated the procedure well.  All sponge, needle and instrument counts were correct at the end of the procedure. The patient was then extubated in the operating room and taken in stable condition to the recovery room. DD:  09/09/00 TD:  09/09/00 Job: [id number]

## 2010-08-14 NOTE — Procedures (Signed)
[facility]  Patient:    [name]                         MRN: [id number] Proc. Date: 10/27/99 Adm. Date:  [id number] Attending:  Deneen Harts CC:         Milus Mallick, M.D.  Robert A. Eliezer Lofts., M.D.   Procedure Report  PROCEDURE PERFORMED:  Colonoscopic polypectomy, biopsy.  ENDOSCOPIST:  Griffith Citron, M.D.  INDICATIONS FOR PROCEDURE:  The patient is a 77 year old white female with a prior history of rectal cancer resected 1986.  She initially had a transverse colostomy to protect the surgical anastomosis.  This was subsequently taken down.  Colonoscopy last performed in 1996 at which time adenomatous polyps were resected.  The rectal stump, at the region of the end-to-side rectosigmoid anastomosis was noted to be acutely inflamed, consistent with a diversion colitis and this was biopsied.  Pathology confirmed acute mucosal colitis.  The patient has done extremely well over the past five years.  No interim difficulty.  Bowel movements are regular, at times minimally constipated. Denies hematochezia, no change in stool caliber.  DESCRIPTION OF PROCEDURE:  After reviewing the nature of the procedure with the patient including potential risks and complications including hemorrhage and perforation, informed consent was given.  The patient was sedated receiving Versed 7 mg, fentanyl 75 mcg administered IV in divided doses prior to the onset of the procedure.  Using theOlympus PCF-140L pediatric video colonoscope, the rectum was intubated after digital examination.  The scope was inserted and advanced around the entire length of the colon without difficulty.  The cecum was identified by the appendiceal orifice and ileocecal valve.  Preparation was excellent to the level of the cecum where retained mucoid stool was irrigated clear as much as possible.  The scope was slowly withdrawn with careful inspection of the entire colon  in a retrograde manner.  The right colon was entirely normal.  At the hepatic flexure, a diminutive 5 mm polyp was resected using hot biopsy forceps, recovered and submitted to pathology.  In the midtransverse colon the anastomosis from the colostomy takedown was evident and was found to be widely patent.  The distal transverse colon, splenic flexure and descending colon were all normal.  The sigmoid was partially resected and was anastomosed to the rectal vault in a side-to-end manner.  This anastomosis was widely patent.  There was no evidence of neoplasia.  Finally the small segment of rectal stump that is diverted from the main fecal stream was identified.  This continues to be acutely inflamed.  The mucosa is characterized by friable mucosa which is erythematous and with a mucopurulent exudate.  Multiple biopsies were obtained.  No additional lesion was identified.  The colon was decompressed and scope withdrawn.  The patient tolerated the procedure without difficulty being maintained on Datascope monitor and low-flow oxygen throughout.  TIME:  2.  TECHNICAL:  3.  PREPARATION:  2.  TOTAL SCORE:  7.  ASSESSMENT: 1. Hepatic flexure--diminutive polyp, resected.  Pathology pending. 2. Colostomy anastomosis in the midtransverse colon--widely patent. 3. Side-to-end sigmoid-rectal anastomosis, widely patent, no evidence of    recurrent neoplasia. 4. Bypassed rectal stump--acute colitis consistent with diversion colitis.    Multiple biopsies obtained.  RECOMMENDATIONS: 1. Follow-up pathology. 2. Repeat colonoscopy in five years. 3. Postpolypectomy instructions reviewed. 4. No therapy anticipated for diversion colitis unless dysplastic changes are    identified. 1. Postpolypectomy instructions  reviewed. 2. Follow up pathology. 3. Repeat colonoscopy in five years for ongoing surveillance. DD:  10/27/99 TD:  10/28/99 Job: [id number]

## 2010-08-14 NOTE — Procedures (Signed)
[facility]  Patient:    [name], [name]                        MRN: [id number] Proc. Date: 01/13/00 Attending:  Griffith Citron, M.D. CC:         Milus Mallick, M.D.  Robert A. Eliezer Lofts., M.D.   Procedure Report  PROCEDURE:  Flexible sigmoidoscopy.  INDICATION:  A 77 year old white female undergoing flexible sigmoidoscopy two months after colonoscopy revealed diversion colitis in the excluded rectal stump.  The patient has a known end-to-side rectosigmoid anastomosis from prior colon cancer resection.  Biopsies confirm chronic active colitis with epithelial atypia, which was indeterminate for dysplasia.  She was placed on Canasa 500 mg suppository b.i.d.  She was asymptomatic prior to the procedure, and placing her on suppository therapy has not changed her asymptomatic state. She is undergoing repeat sigmoidoscopy to reassess the degree of mucosal inflammation following this treatment.  DESCRIPTION OF PROCEDURE:  After reviewing the nature of the procedure with the patient, including potential risks of hemorrhage and perforation, informed consent was signed.  The patient was not premedicated.  Using an Olympus pediatric PCF-140L video colonoscope, rectum was intubated after a normal digital examination.  The scope was inserted into the rectum, which appeared normal.  At approximately 10 cm, the end-to-side rectosigmoid anastomosis was identified.  The excluded rectal stump was visualized and appeared entirely normal.  The mucosa was intact without edema, erythema, exudate, or friability.  Delicate submucosal blood vessels were identified.  The scope was withdrawn and the colon was then examined to 70 cm.  No abnormality was noted.  The scope was slowly withdrawn.  Photo documentation obtained of the healed diverted rectal stump.  Colon was decompressed, scope withdrawn.  The patient tolerated the procedure without difficulty, being  maintained on Datascope monitor and low-flow oxygen throughout.  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  RECOMMENDATION: 1. Decrease Canasa 500 mg suppository one p.r. nightly.  This is to try to    effect a cost-effective treatment.  She will do this for two months.  I    will then have her return to the office.  At that time, options will    include stopping treatment altogether or further tapering to an    every-other-day regimen.  Return of the colitis can be monitored,    hopefully, with Hemoccults.  I do not think it will be cost-effective to    perform sigmoidoscopy every time a dose change is tried. 2. ROV two months, see above. DD:  01/13/00 TD:  01/14/00 Job: [id number]

## 2010-08-14 NOTE — H&P (Signed)
[facility]  Patient:    [name], [name] Visit Number: [id number] MRN: [id number]          Service Type: EMS Location: ED Attending Physician:  Benny Lennert Dictated by:   Georgena Spurling, M.D. Admit Date:  06/14/2001                           History and Physical  ADMITTING DIAGNOSIS:          Left hip pain (intertrochanteric hip fracture).  ADMITTING PHYSICIAN:          Georgena Spurling, M.D.  HISTORY OF PRESENT ILLNESS:   The patient is a 77 year old white female who lost her balance and fell on her left hip two hours ago.  She was brought to the emergency department by her daughter and husband.  She has no other past medical problems.  She was, however, worked up approximately three to four months ago for chest pain and shortness of breath which included what sounds like a stress test and possibly an angiogram.  Medical consultants are on their way to clear her for surgery.   She has had some surgery including gallbladder surgery, eye surgery, and all those have gone well and been uneventful.  MEDICATIONS:                  None.  ALLERGIES:                    None.  PHYSICAL EXAMINATION:  GENERAL:                      She is well-nourished, well-developed, and no distress.  EXTREMITIES:                  Her left hip is shortened and externally rotated.  The skin is normal.  There is pain with palpation, pain with range of motion.  She does appear to be grossly neurovascularly intact.  LABORATORY DATA:              A/P pelvis shows a left intertrochanteric hip fracture.  Labs are pending.  IMPRESSION:                   Left intertrochanteric hip fracture.  PLAN:                         Admit to my service.  Check labs, EKG, chest x-ray.  Preoperative medical clearance by her primary M.D. and then take to the operating room for open reduction internal fixation this evening. Dictated by:   Georgena Spurling, M.D. Attending Physician:   Benny Lennert DD:  06/14/01 TD:  06/14/01 Job: [id number]

## 2010-08-14 NOTE — Op Note (Signed)
NAME:  [name], [name]                         ACCOUNT NO.:  [id number]   MEDICAL RECORD NO.:  [id number]                   PATIENT TYPE:  AMB   LOCATION:  DAY                                  FACILITY:  [facility]   PHYSICIAN:  Ollen Gross, M.D.                 DATE OF BIRTH:  1929-03-03   DATE OF PROCEDURE:  01/02/2003  DATE OF DISCHARGE:                                 OPERATIVE REPORT   PREOPERATIVE DIAGNOSIS:  Painful hardware left hip.   POSTOPERATIVE DIAGNOSIS:  Painful hardware left hip.   PROCEDURE:  Hardware removal left hip.   SURGEON:  Ollen Gross, M.D.   ASSISTANT:  None.   ANESTHESIA:  General.   ESTIMATED BLOOD LOSS:  Minimal.   DRAINS:  Hemovac x1.   COMPLICATIONS:  None.   CONDITION:  Stable to recovery.   BRIEF CLINICAL NOTE:  [name] is a 77 year old female who had a  compression hip screw placed approximately a year and a half ago for an  intertroch/subtroch femur fracture by Mila Homer. Sherlean Foot, M.D.  She has gone  on to develop painful hardware and I saw her on a second opinion and felt  that she might benefit from hardware removal. She presents now for the above  mentioned procedure.   DESCRIPTION OF PROCEDURE:  After successful administration of general  anesthetic, the patient was placed in the right lateral decubitus position  with the left side up and held with a hip positioner. The left lower  extremity was isolated from his perineum with plastic drapes and prepped and  draped in the usual sterile fashion. Previous incisions were utilized, skin  cut with a 10 blade through the subcutaneous tissues to a level of the  fascia lata which was thinned but intact and incised in line with a skin  incision. The vastus lateralis was also incised and was split through the  muscle to get down to the plate. I removed the side screws from the plate  and then removed the plate over the long lag screw. We attached the  screwdriver impacter to the lag  screw and removed it. All the hardware was  then out and the wound was copiously irrigated with antibiotic solution. The  fascia of the vastus lateralis was  closed with running #1 Vicryl, fascia lata closed over a Hemovac drain with  interrupted #1 Vicryl, subcu closed with #1 and 2-0 Vicryl and skin with  staples. The incision was cleaned and dried and drain is hooked to suction  and a bulky sterile dressing applied. She is then awakened and transported  to recovery in stable condition.                                               Homero Fellers Aluisio,  M.D.    FA/MEDQ  D:  01/02/2003  T:  01/02/2003  Job:  [id number]

## 2010-08-14 NOTE — Discharge Summary (Signed)
[facility]  Patient:    [name], [name]                        MRN: [id number] Adm. Date:  [id number] Disc. Date: [id number] Attending:  Charlton Haws                           Discharge Summary  HISTORY OF PRESENT ILLNESS:  Ms. Ghazarian is a pleasant 77 year old female who presented with epigastric and right upper quadrant pain hurting through to her back. She was seen by her primary care physician who ordered an ultrasound which showed a 1.8 cm gallstone in the neck of the gallbladder without evidence of gallbladder wall thickening. She was found on physical examination to have tenderness in her right upper quadrant consistent with acute biliary colic and cholelithiasis. At this point, her liver function tests were normal and she had a white blood count of 6.6. The patient was admitted with the plans for cholecystectomy. It is of interest, the patient had multiple previous surgical procedures.  HOSPITAL COURSE:  The patient was admitted and placed on IV antibiotics and the next day was taken to the operating room where she underwent a laparoscopic converted to an open cholecystectomy with removal of the gallbladder as well as repair of an enterotomy. The patient tolerated the procedure well and was taken in stable condition to the radiosurgical floor for IV antibiotics and bowel rest. The patient did well postoperatively and was placed on liquids on postoperative day one. By postoperative day two, her diet was slowly advanced and her incisions appeared to be healing well and she was without complaints. By postoperative day three, she was tolerating a liquid diet well and her white blood count was 5.5 at this time with a hemoglobin of 10.7. Her diet was advanced as well as her activity. She was continued on antibiotics for the next several days and by postoperative day five was tolerating a regular diet and was having flatus. Her abdomen was soft,  her incisions were healing well. The plan was made to discharge the patient home.  DISCHARGE DIAGNOSES:  Symptomatic cholelithiasis status post laparoscopic converted to open cholecystectomy and repair of enterotomy.  DISCHARGE DIET:  Regular.  DISCHARGE MEDICATIONS:  Resume home medications. She will take Vicodin for pain. She will take a stool softener as needed. She is to do no heavy lifting. She may shower. She will follow-up with my office in one week post discharge. DD:  10/03/00 TD:  10/04/00 Job: [id number]

## 2010-08-14 NOTE — Op Note (Signed)
[place]. [facility]  Patient:    [name], [name] Visit Number: [id number] MRN: [id number]          Service Type: DSU Location: [facility] [id number] Attending Physician:  Tommy Medal Dictated by:   Doris Cheadle Dione Saunders, M.D. Proc. Date: 06/06/01 Admit Date:  06/06/2001 Discharge Date: 06/06/2001   CC:         [name] Maduro A. Eliezer Lofts., M.D.   Operative Report  INDICATIONS AND JUSTIFICATIONS FOR THE PROCEDURE:  [name] has been followed in my office since 1988.  She has had previous cataract surgery performed on her right eye and is a glaucoma suspect with moderate optic nerve cupping.  Recently, she has been more and more bothered by moderately severe blepharochalasis which is redundant skin drooping over her upper lid and blocking the upper visual field.  On 05/26/01, she had complaints regarding this and felt it caused reduced vision, and she could feel the weight of the skin of the lids.  She felt she wanted to have upper eyelid operative blepharoplasty to help her with this problem.  Photographs were taken to document the problem, and visual field testing was performed with the eyelids allowed to relax and then with the eyelids allowed to droop over.  This showed a significant reduction of approximately 20 or 30 degrees in her superior field of vision.  Actually, she lost most of the superior field when the skin drooped over, and actually, examination shows that the skin comes to the edge of the pupils.  Otherwise, the pupils, motility, conjunctiva, cornea, anterior chamber, and fundus exam were unremarkable, and she has an implant in the right eye and a definite moderate left cataract.  The reason for the upper eyelid blepharoplasty is so she can see better.  Medically, she should be stable for this.  She is followed medically by Dr. Elias Else.  JUSTIFICATION FOR PERFORMING PROCEDURE IN OUTPATIENT SETTING:  Routine  JUSTIFICATION FOR OVERNIGHT  STAY:  None.  PREOPERATIVE DIAGNOSIS:  Blepharochalasis with visual impairment.  POSTOPERATIVE DIAGNOSIS:  Blepharochalasis with visual impairment.  PROCEDURE:  Upper eyelid optical blepharoplasties.  ANESTHESIA:  1% Xylocaine with epinephrine.  SURGEON:  [name], M.D.  DESCRIPTION OF PROCEDURE:  The patient arrived in the minor surgery room at [facility] and was prepped and draped in the routine fashion.  A frontal nerve block was given on each side with 1% Xylocaine and epinephrine, and the skin was also infiltrated.  The amount of be excised was carefully demarcated and then excised, and underlying fatty tissue was also removed. There was a significant amount of bleeding requiring cautery and pressure to control this.  At the end of the procedure, after the skin and fatty tissue had been removed from each side, each wound was closed with a running 6-0 nylon suture, and pressure patches were applied.  Polysporin ointment had also been put in each eye and on each wound.  She then left the operating room having done well.  FOLLOW-UP CARE:  The patient is to be seen in my office in five or six days to have the sutures removed and is to remove the patches in four or five hours, and then she will use warm compresses.  She is to use Polysporin ointment in her eyes at night.  She is to call the office if there are problems. Dictated by:   Doris Cheadle. Dione Saunders, M.D. Attending Physician:  Tommy Medal DD:  06/11/01 TD:  06/12/01  Job: [id number]

## 2010-08-14 NOTE — Discharge Summary (Signed)
[facility]  Patient:    [name], [name] Visit Number: [id number] MRN: [id number]          Service Type: [facility] Attending Physician:  Herold Harms Dictated by:   Jamelle Rushing, P.A. Admit Date:  06/19/2001 Discharge Date: 06/27/2001                             Discharge Summary  ADMISSION DIAGNOSIS:  Left hip intertrochanteric fracture.  DISCHARGE DIAGNOSIS:  Closed reduction internal fixation of left hip intertrochanteric fracture.  HISTORY OF PRESENT ILLNESS:  This is a 77 year old, white female who lost her balance and fell on her left hip on the date of admission.  The patient was brought to the emergency room by her daughter and husband.  The patient had no previous medical history.  The patient was, however, worked up for chest pain, probably three to four months previously with a stress test and angiogram. Medical consultation was requested on admission to clear the patient for her upcoming surgery.  MEDICATIONS:  None.  ALLERGIES:  No known drug allergies.  PROCEDURE:  On June 14, 2001, the patient was taken to the operating room by Dr. Georgena Spurling, assisted by Jamelle Rushing, P.A.-C.  Under general anesthesia, the patient underwent a left hip intertrochanteric repair with closed reduction internal fixation.  The patient tolerated this procedure well.  No drains were left in place.  Estimated blood loss was 250 cc.  The patient was returned to the recovery room and then to the orthopedic floor for routine postop care.  CONSULTATIONS: 1. Physical therapy. 2. Occupational therapy. 3. Case management. 4. Rehabilitation. 5. A [facility] consult was requested on admission for evaluation    and clearance for surgery.  HOSPITAL COURSE:  On July 15, 2001, the patient was evaluated in the emergency room at [facility].  She was found to have a left hip intertrochanteric fracture.  She was medically cleared by  [facility] and was taken to the OR by Dr. Sherlean Foot for a closed reduction internal fixation of a left hip intertrochanteric fracture.  The patient tolerated the procedure well and was transferred to the recovery room and then back to the orthopedic floor for routine postop care.  The patient, postoperatively, had a five-day postoperative course on the orthopedic floor in which the patient had no significant postoperative care untoward events.  The patients leg remained neurovascularly intact.  The patient did develop a slight low-grade temperature, but otherwise her vital signs remained stable.  The patient did develop some postoperative blood loss anemia which was expected and not unusual and remained asymptomatic so no transfusion was required.  The patients wound remained benign for any signs of infection.  The patient worked well with physical therapy on a daily basis, but due to the patients home care situation and progress it was felt that several days on the rehabilitation floor would be beneficial for this person prior to being discharged to home.  Arrangements were made and she was in fact discharged to the rehabilitation floor at [facility] in good condition on postop day #5.  LABORATORY DATA AND X-RAY FINDINGS:  CBC on March 22, with WBC 5.3, hemoglobin 9.4, hematocrit 27.9, platelets 122.  On March 21, routine chemistries with sodium 138, potassium 4.3, glucose 127, BUN 16, creatinine 0.8.  Glucose was felt to be elevated due to routine postoperative stress.  EKG on March 19, with  normal sinus rhythm with voltage criteria for left ventricular hypertrophy at a ventricular rate of 81 beats per minute.  Chest x-ray on admission showed heart is upper normal limits with lungs clear. There is scarring at the left base.  DISCHARGE MEDICATIONS: 1. Phenergan 25 mg IV p.o. p.r.n. 2. Lovenox 30 mg subcu q.12h. from postop day #1 for a total of 14 days for    routine  DVT prophylaxis. 3. Percocet one to two tablets every four to six hours p.r.n. pain. 4. Ambien 5-10 mg p.o. q.h.s. p.r.n. 5. Tylenol 650 mg p.o. q.6h. p.r.n. 6. Senokot two tablets p.o. q.d.  SPECIAL INSTRUCTIONS:  The patient is to continue medications as dispensed on orthopedic floor.  Continue with Lovenox for a total of 14 days for routine DVT prophylaxis.  ACTIVITY:  The patient may be weightbearing as tolerated with close supervision with a walker.  DIET:  No restrictions.  WOUND CARE:  The patient is to have wound checked daily for any signs of infection.  Staples are to be removed on postop day #14.  FOLLOWUP:  The patient is to have a follow-up appointment arranged with Dr. Sherlean Foot.  Please call [phone], two weeks from the date of discharge from rehabilitation floor.  CONDITION ON DISCHARGE:  Improved and good. Dictated by:   Jamelle Rushing, P.A. Attending Physician:  Herold Harms DD:  08/03/01 TD:  08/06/01 Job: [id number]

## 2010-08-14 NOTE — Op Note (Signed)
[facility]  Patient:    [name], [name] Visit Number: [id number] MRN: [id number]          Service Type: SUR Location: 4W 0464 01 Attending Physician:  Georgena Spurling Dictated by:   Georgena Spurling, M.D. Proc. Date: 06/14/01 Admit Date:  06/14/2001                             Operative Report  PREOPERATIVE DIAGNOSIS:       Right hip intertrochanteric fracture.  POSTOPERATIVE DIAGNOSIS:      Right hip intertrochanteric fracture.  OPERATION:                    Left intertrochanteric open reduction and                               internal fixation.  SURGEON:                      Georgena Spurling, M.D.  ASSISTANT:                    Jamelle Rushing, P.A.  ANESTHESIA:                   General.  INDICATIONS:                  The patient is a 77 year old white female who fell earlier today.  Medical clearance was obtained.  Informed consent was obtained.  DESCRIPTION OF PROCEDURE:     The patient was laid supine and administered general endotracheal anesthesia.  She was then placed on the fracture table with the left leg in traction.  Right leg well-padded in the lithotomy position.  The left hip was prepped and draped in the usual sterile fashion. A straight lateral incision was made with the #10 blade.  Dissection was continued down to the fascia lata which was pierced with the cautery.  The vastus lateralis was then elevated anteriorly with a Cobb and Bennett, and held it in place.  Under C-arm guidance, after a reduction was obtained, and a 2.0 mm guidewire was placed in a center/center position through the femoral neck and head.  We then measured and reamed to 85 mm, placing a 90 mm screw. Then, placed a short barrel with 135 degree side plate and affixed with four bicortical screws.  We checked in AP and lateral planes to make sure the hardware was in good position.  The fracture was reduced anatomically.  We then irrigated and closed the  fascia lata with a running #1 Vicryl stitch. The deep soft tissue with interrupted 0 Vicryl stitches and a subcuticular 2-0 Vicryl stitch.  Closed the wounds with skin staples.  The patient tolerated the procedure well.  Drains none.  Complications none. EBL 250 cc.  Dressings - Xeroform, 4 x 4s, ABDs, and _________. Dictated by:   Georgena Spurling, M.D. Attending Physician:  Georgena Spurling DD:  06/14/01 TD:  06/16/01 Job: [id number]

## 2010-08-14 NOTE — Discharge Summary (Signed)
[place]. [facility]  Patient:    [name], HEIMANN Visit Number: [id number] MRN: [id number]          Service Type: [facility] Attending Physician:  Herold Harms Dictated by:   Dian Situ, PA Admit Date:  06/19/2001 Discharge Date: 06/27/2001   CC:         Elana Alm. Eliezer Lofts., M.D.  Griffith Citron, M.D.  Georgena Spurling, M.D.   Discharge Summary  DISCHARGE DIAGNOSES: 1. Status post left hip open reduction, internal fixation of fracture. 2. Postoperative anemia. 3. Abnormal liver function tests, resolved. 4. Klebsiella Pneumoniae urinary tract infection.  HISTORY OF PRESENT ILLNESS:  The patient is a 77 year old female with history of colon cancer who was otherwise in relatively good health who tripped and fell on June 14, 2001 sustaining left hip intertrochanteric fracture.  She underwent open reduction, internal fixation the same day by Dr. Sherlean Foot and postoperatively has been weight-bearing as tolerated.  Subcutaneous Lovenox was added for deep venous thrombosis prophylaxis. The patient has been progressing along on therapy currently moderate assist for transfer, moderate assist for ambulating 11 feet with rolling walker, requiring increased time.  PAST MEDICAL HISTORY:  See discharge diagnosis plus history of small bowel obstruction with perforation requiring repair, perforated appendectomy requiring colostomy with reanastomosis, bilateral blepharoplasty, frequency and nocturia, left knee osteoarthritis and left great toe pinning.  ALLERGIES:  No known drug allergies.  SOCIAL HISTORY:  Patient is married, lives in two level home with four steps at entry, was independent and active prior to admission.  She does not use any alcohol and has a remote history of tobacco use.  HOSPITAL COURSE:  The patient was admitted to rehabilitation on June 19, 2001 for inpatient therapies to consist of physical therapy and occupational therapy  daily.  Past admission the patient was maintained on subcutaneous Lovenox for deep vein thrombosis prophylaxis.  Bilateral lower extremity duplex done on June 19, 2001 showing no evidence of deep venous thrombosis. This was a technically difficult study secondary to patients body habitus. The patients left hip incision was monitored along and was noted to be healing well without any signs consistent with infection, no drainage or or erythema noted.  The wound was Steri-Stripped on postoperative day 13 without difficulty.  LABORATORY DATA:  Laboratories were done past admission showing hemoglobin 9.3, hematocrit 27.5, white blood cell count 5.1, platelet count 161K.  Sodium 132, potassium 4.9, chloride 100, cO2 21, BUN 14, creatinine 0.6, glucose 134. She was noted to have mild nonspecific elevation in liver function tests with SGOT at 40 and SGPT at 39, total bilirubin 0.8.  A recheck on June 26, 2001 shows resolution with AST at 20, ALT 40, alkaline phosphatase 61, total bilirubin 0.5.  Follow up of her anemia showed stability with hemoglobin and hematocrit at 9.4 and 27.6.  Of note urine culture at admission showed greater than 100,000 colonies of Klebsiella pneumonia and patient was treated with 7 day course of Augmentin for this.  Pain control was initially an issue.  She was started on OxyContin with improvement in her symptomatology.  The patient has had problems with left lower extremity edema in part secondary to patients disinterest in using support stockings and initially in using some diuretics to help in treatment of fluids.  She did agree to extra doses of Lasix prior to discharge to help with the lower extremity edema.  The patient progressed along well with her therapies.  She was modified independent for activities  of daily living including toileting.  She was modified independent for transfers, modified independent for ambulating 20 feet times two with rolling walker.   Further follow up therapies to include home health physical therapy and occupational therapy which will be provided by [facility].  On June 27, 2001 the patient was discharged to home.  DISCHARGE MEDICATIONS: 1. Coated aspirin 81 mg per day X one month. 2. OxyContin CR 10 mg b.i.d. 3. Iron supplements b.i.d. 4. Senokot-S, two p.o. q.h.s. 5. OxyIR 5 to 10 mg p.o. q.4-6h. p.r.n. pain.  ACTIVITY:  Patient is to use walker.  DIET:  Regular.  WOUND CARE:  The patient is to keep area clean and dry.  To wrap the left lower extremity and keep elevated whenever possible.  Once edema decreases to start wearing support stockings again.  SPECIAL INSTRUCTIONS:  No alcohol, no smoking, no driving.  [facility] to provide occupational therapy, physical therapy.  FOLLOW UP:  Patient is to follow up with Dr. Sherlean Foot in two weeks.  Follow up with Dr. Ellwood Dense as needed. Dictated by:   Dian Situ, PA Attending Physician:  Herold Harms DD:  07/23/01 TD:  07/24/01 Job: [id number]

## 2010-08-14 NOTE — Op Note (Signed)
[place]. [facility]  Patient:    [name], [name]                        MRN: [id number] Proc. Date: 07/27/99 Adm. Date:  [id number] Disc. Date: [id number] Attending:  Alinda Deem                           Operative Report  PREOPERATIVE DIAGNOSIS:  Subcutaneous, subfascial mass, right leg.  POSTOPERATIVE DIAGNOSIS:  Subcutaneous, subfascial mass, right leg.  OPERATION PERFORMED:  Excisional biopsy of right leg subfascial mass.  SURGEON:  Alinda Deem, M.D.  ANESTHESIA:  Local with IV sedation.  ESTIMATED BLOOD LOSS:  Minimal.  FLUID REPLACEMENT:  500 cc crystalloid.  DRAINS:  None.  TOURNIQUET TIME:  None.  INDICATIONS FOR PROCEDURE:   The patient is a 77 year old woman with a 1 cm painless semimobile right knee subcutaneous mass about one inch inferior to the anterolateral jointline.  The patient desires removal because of tenderness when it is bumped or backed into objects and is aware of the risks and benefits of surgery.  It has been present for at least a year.  It is mobile, nonpainful on its own and only causes her discomfort when she hits it on a door jamb or some other object.  Plain radiographs of the knee were unremarkable.  DESCRIPTION OF PROCEDURE:  The patient was identified by arm band and taken to the operating room at [facility] where the appropriate anesthetic monitors were attached and local anesthesia with IV sedation was induced, placing a horseshoe type block around the mass using a 50/50 mixture of 0.5% Marcaine and 2% Xylocaine.  We then made a 1 cm longitudinal incision over the mass cutting through the skin and subcutaneous tissue down to the fascia over the anterolateral aspect of the knee joint and leg.  The mass itself was about 8 to 9 mm in size, was mobilized and removed without difficulty and had the appearance of a fibrous nodule or possibly a scarred down lipoma.  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  Small bleeders were identified and cauterized with electrocautery.  The wound was washed out with normal saline solution and then closed with running 5-0 nylon suture.  A dressing of Xeroform, 4 x 8 dressing sponges, Webril and an Ace wrap applied.  The patient was then taken to the recovery room without difficulty. DD:  08/17/99 TD:  08/20/99 Job: [id number]

## 2010-08-28 ENCOUNTER — Other Ambulatory Visit: Payer: Self-pay | Admitting: Hematology & Oncology

## 2010-08-28 ENCOUNTER — Encounter (HOSPITAL_BASED_OUTPATIENT_CLINIC_OR_DEPARTMENT_OTHER): Payer: Medicare Other | Admitting: Hematology & Oncology

## 2010-08-28 DIAGNOSIS — C9110 Chronic lymphocytic leukemia of B-cell type not having achieved remission: Secondary | ICD-10-CM

## 2010-08-28 LAB — CBC WITH DIFFERENTIAL (CANCER CENTER ONLY)
EOS%: 0.6 % (ref 0.0–7.0)
Eosinophils Absolute: 0.2 10*3/uL (ref 0.0–0.5)
HCT: 39 % (ref 34.8–46.6)
LYMPH#: 38.2 10*3/uL — ABNORMAL HIGH (ref 0.9–3.3)
LYMPH%: 91.3 % — ABNORMAL HIGH (ref 14.0–48.0)
MCHC: 32.3 g/dL (ref 32.0–36.0)
MONO%: 1.5 % (ref 0.0–13.0)
RBC: 4.61 10*6/uL (ref 3.70–5.32)
RDW: 14.4 % (ref 11.1–15.7)

## 2010-08-28 LAB — TECHNOLOGIST REVIEW CHCC SATELLITE

## 2010-08-28 LAB — CHCC SATELLITE - SMEAR

## 2010-10-08 ENCOUNTER — Ambulatory Visit
Admission: RE | Admit: 2010-10-08 | Discharge: 2010-10-08 | Disposition: A | Discharge status: Home / Self Care | Payer: Medicare Other | Source: Ambulatory Visit | Attending: Family Medicine | Admitting: Family Medicine

## 2010-10-08 ENCOUNTER — Other Ambulatory Visit: Payer: Self-pay | Admitting: Family Medicine

## 2010-10-08 DIAGNOSIS — M7989 Other specified soft tissue disorders: Secondary | ICD-10-CM

## 2010-11-04 ENCOUNTER — Encounter: Payer: Medicare Other | Admitting: Vascular Surgery

## 2010-11-11 ENCOUNTER — Emergency Department (HOSPITAL_COMMUNITY)
Admission: EM | Admit: 2010-11-11 | Discharge: 2010-11-11 | Disposition: A | Discharge status: Home / Self Care | Payer: Medicare Other | Attending: Emergency Medicine | Admitting: Emergency Medicine

## 2010-11-11 DIAGNOSIS — M7989 Other specified soft tissue disorders: Secondary | ICD-10-CM | POA: Insufficient documentation

## 2010-11-11 DIAGNOSIS — M79609 Pain in unspecified limb: Secondary | ICD-10-CM

## 2010-11-11 DIAGNOSIS — I10 Essential (primary) hypertension: Secondary | ICD-10-CM | POA: Insufficient documentation

## 2010-12-02 ENCOUNTER — Other Ambulatory Visit: Payer: Self-pay | Admitting: Hematology & Oncology

## 2010-12-02 ENCOUNTER — Encounter (HOSPITAL_BASED_OUTPATIENT_CLINIC_OR_DEPARTMENT_OTHER): Payer: Medicare Other | Admitting: Hematology & Oncology

## 2010-12-02 DIAGNOSIS — C9110 Chronic lymphocytic leukemia of B-cell type not having achieved remission: Secondary | ICD-10-CM

## 2010-12-02 LAB — CBC WITH DIFFERENTIAL (CANCER CENTER ONLY)
BASO#: 0 10*3/uL (ref 0.0–0.2)
BASO%: 0 % (ref 0.0–2.0)
EOS%: 0.6 % (ref 0.0–7.0)
HCT: 37.6 % (ref 34.8–46.6)
HGB: 12.5 g/dL (ref 11.6–15.9)
LYMPH%: 89.7 % — ABNORMAL HIGH (ref 14.0–48.0)
MCH: 28.2 pg (ref 26.0–34.0)
MCHC: 33.2 g/dL (ref 32.0–36.0)
MONO%: 0.9 % (ref 0.0–13.0)
NEUT%: 8.8 % — ABNORMAL LOW (ref 39.6–80.0)
RDW: 14.4 % (ref 11.1–15.7)

## 2010-12-03 ENCOUNTER — Other Ambulatory Visit (HOSPITAL_COMMUNITY): Payer: Self-pay | Admitting: Orthopedic Surgery

## 2010-12-03 DIAGNOSIS — M25562 Pain in left knee: Secondary | ICD-10-CM

## 2010-12-21 ENCOUNTER — Encounter (HOSPITAL_COMMUNITY)
Admission: RE | Admit: 2010-12-21 | Discharge: 2010-12-21 | Disposition: A | Discharge status: Home / Self Care | Payer: Medicare Other | Source: Ambulatory Visit | Attending: Orthopedic Surgery | Admitting: Orthopedic Surgery

## 2010-12-21 ENCOUNTER — Encounter (HOSPITAL_COMMUNITY): Payer: Self-pay

## 2010-12-21 DIAGNOSIS — M25569 Pain in unspecified knee: Secondary | ICD-10-CM | POA: Insufficient documentation

## 2010-12-21 DIAGNOSIS — M25562 Pain in left knee: Secondary | ICD-10-CM

## 2010-12-21 DIAGNOSIS — Z96659 Presence of unspecified artificial knee joint: Secondary | ICD-10-CM | POA: Insufficient documentation

## 2010-12-21 HISTORY — DX: Presence of unspecified artificial knee joint: Z96.659

## 2010-12-21 HISTORY — DX: Pain in left knee: M25.562

## 2010-12-21 MED ORDER — TECHNETIUM TC 99M MEDRONATE IV KIT
23.6000 | PACK | Freq: Once | INTRAVENOUS | Status: AC | PRN
  Administered 2010-12-21: 23.6 via INTRAVENOUS

## 2011-01-11 LAB — BASIC METABOLIC PANEL
BUN: 17
CO2: 31
CO2: 30
Calcium: 9
Chloride: 102
Creatinine, Ser: 0.74
Creatinine, Ser: 0.77
GFR calc Af Amer: >60
GFR calc Af Amer: >60
GFR calc Af Amer: >60
GFR calc non Af Amer: >60
GFR calc non Af Amer: >60
Potassium: 4.2
Potassium: 4.5
Sodium: 134 — ABNORMAL LOW
Sodium: 137

## 2011-01-11 LAB — TYPE AND SCREEN

## 2011-01-11 LAB — PROTIME-INR
INR: 1.1
INR: 1.2
INR: 1.2
Prothrombin Time: 14.2
Prothrombin Time: 14.6
Prothrombin Time: 15.4 — ABNORMAL HIGH
Prothrombin Time: 15.9 — ABNORMAL HIGH

## 2011-01-11 LAB — CBC
HCT: 36.4
Hemoglobin: 8.2 — ABNORMAL LOW
Hemoglobin: 9.7 — ABNORMAL LOW
MCHC: 34
MCHC: 34.1
MCHC: 34.5
MCV: 80.9
MCV: 81.2
Platelets: 131 — ABNORMAL LOW
RBC: 2.98 — ABNORMAL LOW
RBC: 3.38 — ABNORMAL LOW
RBC: 3.42 — ABNORMAL LOW
RBC: 3.48 — ABNORMAL LOW
RBC: 4.5
RDW: 15 — ABNORMAL HIGH
WBC: 10.8 — ABNORMAL HIGH
WBC: 14.4 — ABNORMAL HIGH

## 2011-01-11 LAB — URINALYSIS, ROUTINE W REFLEX MICROSCOPIC
Bilirubin Urine: NEGATIVE
Glucose, UA: NEGATIVE
Hgb urine dipstick: NEGATIVE
Ketones, ur: NEGATIVE
Protein, ur: NEGATIVE

## 2011-01-11 LAB — URINE MICROSCOPIC-ADD ON

## 2011-01-11 LAB — DIFFERENTIAL
Basophils Absolute: 0
Eosinophils Absolute: 0.3
Lymphocytes Relative: 73 — ABNORMAL HIGH
Lymphs Abs: 10.5 — ABNORMAL HIGH
Monocytes Absolute: 0.6
Neutro Abs: 3

## 2011-01-11 LAB — COMPREHENSIVE METABOLIC PANEL
ALT: 17
AST: 17
CO2: 30
Chloride: 101
Creatinine, Ser: 0.75
GFR calc Af Amer: >60
GFR calc non Af Amer: >60
Total Bilirubin: 0.9

## 2011-01-11 LAB — PREPARE RBC (CROSSMATCH)

## 2011-03-10 ENCOUNTER — Telehealth: Payer: Self-pay | Admitting: *Deleted

## 2011-03-10 NOTE — Telephone Encounter (Signed)
Pt cx 12-13 moved to 12-31

## 2011-03-11 ENCOUNTER — Ambulatory Visit: Payer: Medicare Other | Admitting: Hematology & Oncology

## 2011-03-11 ENCOUNTER — Other Ambulatory Visit: Payer: Medicare Other | Admitting: Lab

## 2011-03-29 ENCOUNTER — Other Ambulatory Visit (HOSPITAL_BASED_OUTPATIENT_CLINIC_OR_DEPARTMENT_OTHER): Payer: Medicare Other | Admitting: Lab

## 2011-03-29 ENCOUNTER — Other Ambulatory Visit: Payer: Self-pay | Admitting: Hematology & Oncology

## 2011-03-29 ENCOUNTER — Ambulatory Visit (HOSPITAL_BASED_OUTPATIENT_CLINIC_OR_DEPARTMENT_OTHER): Payer: Medicare Other | Admitting: Hematology & Oncology

## 2011-03-29 DIAGNOSIS — D696 Thrombocytopenia, unspecified: Secondary | ICD-10-CM

## 2011-03-29 DIAGNOSIS — C9110 Chronic lymphocytic leukemia of B-cell type not having achieved remission: Secondary | ICD-10-CM

## 2011-03-29 LAB — CBC WITH DIFFERENTIAL (CANCER CENTER ONLY)
Eosinophils Absolute: 0.2 10*3/uL (ref 0.0–0.5)
HCT: 37.9 % (ref 34.8–46.6)
LYMPH#: 32.2 10*3/uL — ABNORMAL HIGH (ref 0.9–3.3)
LYMPH%: 91 % — ABNORMAL HIGH (ref 14.0–48.0)
MCV: 86 fL (ref 81–101)
MONO#: 0.6 10*3/uL (ref 0.1–0.9)
NEUT%: 6.7 % — ABNORMAL LOW (ref 39.6–80.0)
Platelets: 112 10*3/uL — ABNORMAL LOW (ref 145–400)
RBC: 4.39 10*6/uL (ref 3.70–5.32)
WBC: 35.4 10*3/uL — ABNORMAL HIGH (ref 3.9–10.0)

## 2011-03-29 LAB — TECHNOLOGIST REVIEW CHCC SATELLITE

## 2011-03-29 NOTE — Progress Notes (Signed)
This office note has been dictated.

## 2011-03-29 NOTE — Progress Notes (Signed)
CC:   [name], M.D. Fayne Norrie, M.D.  DIAGNOSIS:  Chronic lymphocytic leukemia, stage A.  CURRENT THERAPY:  Observation.  INTERIM HISTORY:  [name] come in for followup.  We last saw her back in November.  She is doing okay.  She really has not had any problems since we last saw her.  She has had no further swelling of her legs.  She has had no cough.  She has had no nausea or vomiting.  There has been no fevers, sweats, or chills.  She has had a good appetite. She is gaining weight, which she is not too happy about.  She has had no rashes.  PHYSICAL EXAMINATION:  General Appearance:  This is a slightly obese, white female in no obvious distress.  Vital Signs:  Temperature of 97. Pulse 66.  Respiratory rate 18.  Blood pressure 176/89.  Weight was not taken.  Head and Neck Exam:  A normocephalic, atraumatic skull.  There are no ocular or oral lesions.  There is no adenopathy in her neck. Thyroid is nonpalpable.  Lungs:  Clear bilaterally.  Cardiac Exam: Regular rate and rhythm with a normal S1 and S2.  There are no murmurs, rubs, or bruits.  Abdominal Exam:  Soft with good bowel sounds.  There is no palpable abdominal mass.  There is no fluid wave.  There is no palpable hepatosplenomegaly.  Back Exam:  No tenderness over the spine, ribs, or hips.  Extremities:  No clubbing, cyanosis, or edema.  Skin Exam:  No rashes, ecchymosis, or petechia.  LABORATORY STUDIES:  White cell count is 35.4, hemoglobin 12.1, hematocrit 37.9, platelet count 112,000.  Her white cell differential shows 7 segs and 91 lymphocytes.  IMPRESSION:  Ms. Hitson is an 77 year old white female with a history of stage A chronic lymphocytic leukemia.  One might classify her to a higher stage with her thrombocytopenia.  It is possible that the thrombocytopenia could be secondary to this chronic lymphocytic leukemia.  We have been following her now for over 3 years.  Her white cell count has gone  up slowly but surely.  Her hemoglobin has been holding steady. Her platelet count has gone down.  I still think we get follow her.  I want to see here back in another 3 months or so for followup.  I do not see that we need to do anything invasive right now with her.    ______________________________ Josph Macho, M.D. PRE/MEDQ  D:  03/29/2011  T:  03/29/2011  Job:  [id number]

## 2011-06-23 ENCOUNTER — Ambulatory Visit (HOSPITAL_BASED_OUTPATIENT_CLINIC_OR_DEPARTMENT_OTHER): Payer: Medicare Other | Admitting: Hematology & Oncology

## 2011-06-23 ENCOUNTER — Other Ambulatory Visit (HOSPITAL_BASED_OUTPATIENT_CLINIC_OR_DEPARTMENT_OTHER): Payer: Medicare Other | Admitting: Lab

## 2011-06-23 VITALS — BP 166/89 | HR 95 | Temp 97.5°F | Ht 65.0 in | Wt 208.0 lb

## 2011-06-23 DIAGNOSIS — C9110 Chronic lymphocytic leukemia of B-cell type not having achieved remission: Secondary | ICD-10-CM

## 2011-06-23 LAB — CBC WITH DIFFERENTIAL (CANCER CENTER ONLY)
BASO%: 0.2 % (ref 0.0–2.0)
LYMPH#: 26.7 10*3/uL — ABNORMAL HIGH (ref 0.9–3.3)
MONO#: 0.6 10*3/uL (ref 0.1–0.9)
NEUT#: 3.3 10*3/uL (ref 1.5–6.5)
Platelets: 113 10*3/uL — ABNORMAL LOW (ref 145–400)
RDW: 14.3 % (ref 11.1–15.7)
WBC: 30.9 10*3/uL — ABNORMAL HIGH (ref 3.9–10.0)

## 2011-06-23 LAB — CHCC SATELLITE - SMEAR

## 2011-06-23 NOTE — Progress Notes (Signed)
This office note has been dictated.

## 2011-06-23 NOTE — Progress Notes (Signed)
CC:   [name], M.D.  DIAGNOSIS:  Chronic lymphocytic leukemia, stage A.  CURRENT THERAPY:  Observation.  INTERIM HISTORY:  [name] comes in for followup.  We last saw her back in December.  She is doing well.  She had a vein procedure done, I think, on her left leg.  This seemed to work for her.  Otherwise, she has had no difficulties.  Her weight has been going up. She wants to try to lose some weight.  She has not noticed any fevers.  She has had no palpable lymph glands. There has been no cough.  She has had no change in bowel or bladder habits.  PHYSICAL EXAMINATION:  General:  This is a moderately obese white female in no obvious distress.  Vital Signs:  97.5, pulse 95, respiratory rate 16, blood pressure 166/89.  Weight is 208.  Head and Neck Exam:  Shows a normocephalic, atraumatic skull.  There are no ocular or oral lesions. There are no palpable cervical or supraclavicular lymph nodes.  Lungs: Clear bilaterally.  Cardiac Exam:  Regular rate and rhythm with normal S1 and S2.  There are no murmurs, rubs or bruits.  Abdominal Exam:  Soft with good bowel sounds.  There is no palpable abdominal mass.  There is no palpable hepatosplenomegaly.  Extremities:  Show no clubbing, cyanosis, or edema.  She may have some slight stasis dermatitis-type changes in her lower legs.  Skin Exam:  No rashes, ecchymosis or petechiae.  She has numerous seborrheic keratoses on her back.  Axillary Exam:  Shows no bilateral axillary adenopathy.  LABORATORY STUDIES:  White cell count is 31, hemoglobin 12.1, hematocrit 37.2, platelet count 113.  White cell differential shows 11 segs, 86 lymphs.  IMPRESSION:  [name] is an 78 year old white female with stage A chronic lymphocytic leukemia.  We have been following her now for several years.  So far, her chronic lymphocytic leukemia has not shown any "inclination" to be aggressive.  We will go ahead and plan to get her back in 4 months  now.  I do not see that we need any blood work in between visits.    ______________________________ Josph Macho, M.D. PRE/MEDQ  D:  06/23/2011  T:  06/23/2011  Job:  [id number]

## 2011-10-28 ENCOUNTER — Ambulatory Visit (HOSPITAL_BASED_OUTPATIENT_CLINIC_OR_DEPARTMENT_OTHER): Payer: Medicare Other | Admitting: Hematology & Oncology

## 2011-10-28 ENCOUNTER — Other Ambulatory Visit (HOSPITAL_BASED_OUTPATIENT_CLINIC_OR_DEPARTMENT_OTHER): Payer: Medicare Other | Admitting: Lab

## 2011-10-28 VITALS — BP 168/80 | HR 66 | Temp 97.0°F | Ht 62.0 in | Wt 197.0 lb

## 2011-10-28 DIAGNOSIS — C9110 Chronic lymphocytic leukemia of B-cell type not having achieved remission: Secondary | ICD-10-CM

## 2011-10-28 LAB — CBC WITH DIFFERENTIAL (CANCER CENTER ONLY)
BASO%: 0.1 % (ref 0.0–2.0)
Eosinophils Absolute: 0.2 10*3/uL (ref 0.0–0.5)
MCH: 27.8 pg (ref 26.0–34.0)
MONO#: 0.5 10*3/uL (ref 0.1–0.9)
MONO%: 1.2 % (ref 0.0–13.0)
NEUT#: 2.9 10*3/uL (ref 1.5–6.5)
Platelets: 131 10*3/uL — ABNORMAL LOW (ref 145–400)
RBC: 4.54 10*6/uL (ref 3.70–5.32)
WBC: 41.3 10*3/uL — ABNORMAL HIGH (ref 3.9–10.0)

## 2011-10-28 LAB — TECHNOLOGIST REVIEW CHCC SATELLITE

## 2011-10-28 NOTE — Progress Notes (Signed)
This office note has been dictated.

## 2011-10-29 NOTE — Progress Notes (Signed)
CC:   Tally Joe, M.D.  DIAGNOSIS:  Stage A chronic lymphocytic leukemia.  CURRENT THERAPY:  Observation.  INTERIM HISTORY:  [name] comes in for her followup.  We saw her back in late March.  Since then, she has been doing okay.  She has had no difficulties with respect to fevers, sweats, or chills.  Her appetite has been good.  There has been no change in bowel or bladder habits. She has had no cough or shortness of breath.  There has been no leg swelling.  She has had some issues with varicose veins of her lower legs.  She has had no headache.  There has been no dysphagia or odynophagia.  PHYSICAL EXAMINATION:  General:  This is a well-developed, well- nourished white female in no obvious distress.  Vital Signs: Temperature 97.2, pulse 66, respiratory rate 18, blood pressure 168/80, weight is 197.  Head and Neck Exam:  Shows a normocephalic, atraumatic skull.  There are no ocular or oral lesions.  There are no palpable cervical or supraclavicular lymph nodes.  Lungs:  Clear bilaterally. Cardiac Exam:  Regular rate and rhythm with a normal S1 and S2.  She has a 1/6 systolic ejection murmur.  Axillary Exam:  Shows no bilateral axillary adenopathy.  Abdominal Exam:  Soft with good bowel sounds. There is no palpable abdominal mass.  There is no fluid wave.  There is no palpable hepatosplenomegaly.  Back Exam:  Shows some slight kyphosis. There is no tenderness over the spine, ribs, or hips.  Extremities: Show no clubbing, cyanosis, or edema.  Skin Exam:  Does show some seborrheic keratoses.  LABORATORY DATA:  White cell count is 41.3, hemoglobin 12.6, hematocrit 38.7, platelet count 131.  White cell differential shows 7% segs and 91% lymphocytes.  IMPRESSION:  [name] is an 78 year old white female with stage A chronic lymphocytic leukemia.  I have been following her now for a good 3 years.  Her white cell count tends to go up and down.  A year ago, her white cell count  was 41.7.  We will plan to get her back in 4 months.  I just do not think we need to do any blood work in between visits.    ______________________________ Josph Macho, M.D. PRE/MEDQ  D:  10/28/2011  T:  10/29/2011  Job:  [id number]

## 2012-03-01 ENCOUNTER — Ambulatory Visit (HOSPITAL_BASED_OUTPATIENT_CLINIC_OR_DEPARTMENT_OTHER): Payer: Medicare Other | Admitting: Hematology & Oncology

## 2012-03-01 ENCOUNTER — Other Ambulatory Visit (HOSPITAL_BASED_OUTPATIENT_CLINIC_OR_DEPARTMENT_OTHER): Payer: Medicare Other | Admitting: Lab

## 2012-03-01 VITALS — BP 169/66 | HR 65 | Temp 97.5°F | Resp 16 | Ht 62.0 in | Wt 203.0 lb

## 2012-03-01 DIAGNOSIS — C9110 Chronic lymphocytic leukemia of B-cell type not having achieved remission: Secondary | ICD-10-CM

## 2012-03-01 DIAGNOSIS — D696 Thrombocytopenia, unspecified: Secondary | ICD-10-CM

## 2012-03-01 LAB — CBC WITH DIFFERENTIAL (CANCER CENTER ONLY)
BASO%: 0.2 % (ref 0.0–2.0)
EOS%: 0.5 % (ref 0.0–7.0)
HGB: 11.8 g/dL (ref 11.6–15.9)
LYMPH#: 37.8 10*3/uL — ABNORMAL HIGH (ref 0.9–3.3)
MCHC: 31.7 g/dL — ABNORMAL LOW (ref 32.0–36.0)
NEUT#: 2.8 10*3/uL (ref 1.5–6.5)
RDW: 14.3 % (ref 11.1–15.7)
WBC: 41.1 10*3/uL — ABNORMAL HIGH (ref 3.9–10.0)

## 2012-03-01 LAB — TECHNOLOGIST REVIEW CHCC SATELLITE

## 2012-03-01 NOTE — Progress Notes (Signed)
This office note has been dictated.

## 2012-03-02 NOTE — Progress Notes (Signed)
CC:   Tally Joe, M.D.  DIAGNOSIS:  Chronic lymphocytic leukemia (CLL), stage A.  CURRENT THERAPY:  Observation.  INTERIM HISTORY:  Ms. Leven comes in for followup.  She is doing well. We last saw her back in early August.  Since then she has had no problems.  She had a good Thanksgiving.  She ate well.  She had no nausea or vomiting.  She has had no fever.  There have been no palpable lymph glands.  She has had no change in bowels or bladder.  We have been following her now for almost 4 years.  Her white cell count has been trending up but, otherwise, she has been asymptomatic.  PHYSICAL EXAMINATION:  General:  This is a well-developed, well- nourished white female, in no obvious distress.  Vital signs: Temperature 97.5, pulse 65, respiratory rate 16, blood pressure 169/66. Weight is 203.  Head and neck:  Normocephalic, atraumatic skull.  There are no ocular or oral lesions.  There are no palpable cervical or supraclavicular lymph nodes.  Lungs:  Clear to percussion and auscultation bilaterally.  Cardiac:  Regular rate and rhythm, with a normal S1, S2.  There are no murmurs, rubs, or bruits.  Abdomen:  Soft, mildly obese.  She has good bowel sounds.  There is no fluid wave. There is no palpable hepatosplenomegaly.  Extremities:  Some chronic trace nonpitting edema of her legs.  Skin:  Shows no rashes, ecchymosis or petechia.  LABORATORY STUDIES:  White cell count is 41.1, hemoglobin 11.8, hematocrit 37.2, platelet count 113,000.  IMPRESSION:  Ms. Coiro is a very nice 78 year old white female with stage A chronic lymphocytic leukemia.  I suppose with the thrombocytopenia, one could consider her to be stage C.  Again, she is totally asymptomatic.  I do not see that we would be benefitting her at this point in time.  We will plan to get her back in another 4 months.  We will get her through the holidays.    ______________________________ Josph Macho,  M.D. PRE/MEDQ  D:  03/01/2012  T:  03/02/2012  Job:  [id number]

## 2012-06-08 ENCOUNTER — Telehealth: Payer: Self-pay | Admitting: Hematology & Oncology

## 2012-06-08 NOTE — Telephone Encounter (Signed)
Patient called and cx 06/30/12 and resch for 07/11/12

## 2012-06-30 ENCOUNTER — Ambulatory Visit: Payer: Medicare Other | Admitting: Hematology & Oncology

## 2012-06-30 ENCOUNTER — Other Ambulatory Visit: Payer: Medicare Other | Admitting: Lab

## 2012-07-11 ENCOUNTER — Ambulatory Visit (HOSPITAL_BASED_OUTPATIENT_CLINIC_OR_DEPARTMENT_OTHER): Payer: Medicare Other | Admitting: Hematology & Oncology

## 2012-07-11 ENCOUNTER — Other Ambulatory Visit (HOSPITAL_BASED_OUTPATIENT_CLINIC_OR_DEPARTMENT_OTHER): Payer: Medicare Other | Admitting: Lab

## 2012-07-11 VITALS — BP 157/74 | HR 68 | Temp 97.7°F | Resp 16 | Ht 62.0 in | Wt 201.0 lb

## 2012-07-11 DIAGNOSIS — C9110 Chronic lymphocytic leukemia of B-cell type not having achieved remission: Secondary | ICD-10-CM

## 2012-07-11 LAB — CHCC SATELLITE - SMEAR

## 2012-07-11 LAB — CBC WITH DIFFERENTIAL (CANCER CENTER ONLY)
BASO%: 0.3 % (ref 0.0–2.0)
LYMPH#: 41.7 10*3/uL — ABNORMAL HIGH (ref 0.9–3.3)
MONO#: 0.4 10*3/uL (ref 0.1–0.9)
NEUT#: 2.9 10*3/uL (ref 1.5–6.5)
Platelets: 154 10*3/uL (ref 145–400)
RDW: 15.2 % (ref 11.1–15.7)
WBC: 45.3 10*3/uL — ABNORMAL HIGH (ref 3.9–10.0)

## 2012-07-11 NOTE — Progress Notes (Signed)
This office note has been dictated.

## 2012-07-12 NOTE — Progress Notes (Signed)
CC:   [name], M.D.  DIAGNOSIS:  Stage A chronic lymphocytic leukemia.  CURRENT THERAPY:  Observation.  INTERIM HISTORY:  [name] comes in for her followup.  We last saw her back in December.  She got through the wintertime okay.  She has had no problems with fevers, sweats, or chills.  She has had no problems with infections over the wintertime.  Her appetite has been quite good.  There have been no issues with her blood sugars or blood pressure.  She has had no rashes.  She has had no palpable lymph glands.  PHYSICAL EXAMINATION:  General:  This is a well-developed, well- nourished white female in no obvious distress.  Vital Signs: Temperature of 97.7, pulse 68, respiratory rate 16, blood pressure 157/74.  Weight is 201.  Head and neck:  Normocephalic, atraumatic skull.  There are no ocular or oral lesions.  There are no palpable cervical or supraclavicular lymph nodes.  Lungs:  Clear bilaterally. Cardiac:  Regular rate and rhythm with a normal S1 and S2.  There are no murmurs, rubs, or bruits.  Abdomen:  Soft with good bowel sounds.  There is no palpable abdominal mass.  There is no fluid wave.  There is no palpable hepatosplenomegaly.  Axillary:  No bilateral axillary adenopathy.  Back:  Some slight kyphosis.  No tenderness is noted over the spine, ribs, or hips.  Extremities:  No clubbing, cyanosis, or edema.  Neurological:  No focal neurological deficits.  LABORATORY STUDIES:  White cell count is 45.3, hemoglobin 12.2, hematocrit 38.4, platelet count 154.  White cell differential shows 6 segs, 92 lymphs.  IMPRESSION:  [name] is an 79 year old white female with stage A chronic lymphocytic leukemia.  I am glad to see that her platelet count is back to normal now.  I was a little bit worried regarding this.  Again, she continues to be asymptomatic.  We will continue to follow her along in 40-month intervals.  I do not see any need for any blood work in  between visits.    ______________________________ Josph Macho, M.D. PRE/MEDQ  D:  07/11/2012  T:  07/12/2012  Job:  [id number]

## 2012-11-10 ENCOUNTER — Other Ambulatory Visit (HOSPITAL_BASED_OUTPATIENT_CLINIC_OR_DEPARTMENT_OTHER): Payer: Medicare Other | Admitting: Lab

## 2012-11-10 ENCOUNTER — Encounter: Payer: Self-pay | Admitting: Hematology & Oncology

## 2012-11-10 ENCOUNTER — Ambulatory Visit (HOSPITAL_BASED_OUTPATIENT_CLINIC_OR_DEPARTMENT_OTHER): Payer: Medicare Other | Admitting: Hematology & Oncology

## 2012-11-10 DIAGNOSIS — C9110 Chronic lymphocytic leukemia of B-cell type not having achieved remission: Secondary | ICD-10-CM

## 2012-11-10 HISTORY — DX: Chronic lymphocytic leukemia of B-cell type not having achieved remission: C91.10

## 2012-11-10 LAB — CBC WITH DIFFERENTIAL (CANCER CENTER ONLY)
BASO#: 0 10*3/uL (ref 0.0–0.2)
BASO%: 0.1 % (ref 0.0–2.0)
HCT: 39.1 % (ref 34.8–46.6)
HGB: 12.6 g/dL (ref 11.6–15.9)
LYMPH#: 37.7 10*3/uL — ABNORMAL HIGH (ref 0.9–3.3)
MONO#: 0.4 10*3/uL (ref 0.1–0.9)
NEUT%: 7 % — ABNORMAL LOW (ref 39.6–80.0)
RDW: 14.4 % (ref 11.1–15.7)
WBC: 41.2 10*3/uL — ABNORMAL HIGH (ref 3.9–10.0)

## 2012-11-10 NOTE — Progress Notes (Signed)
This office note has been dictated.

## 2012-11-11 NOTE — Progress Notes (Signed)
CC:   [name], M.D.  DIAGNOSIS:  Chronic lymphocytic leukemia, stage A.  CURRENT THERAPY:  Observation.  INTERIM HISTORY:  [name] comes in for her followup.  She is doing okay.  She is unfortunately developing worsening macular degeneration. She is getting intra-ocular injections.  I suspect that these are with Avastin.  She has had no problems with bleeding.  There has been no abdominal pain.  There have been no fevers.  She has had no rashes.  She continues on her medications which are for high blood pressure.  PHYSICAL EXAM:  General:  This is a well-developed, well-nourished white female in no obvious distress.  Vital Signs:  Show a temperature of 98.1, pulse 63, respiratory rate 20, blood pressure 163/75.  Weight is 206.  Head and Neck:  Show a normocephalic, atraumatic skull.  There are no ocular or oral lesions.  There are no palpable cervical or supraclavicular lymph nodes.  Lungs:  Clear bilaterally.  Cardiac: Regular rate and rhythm with a normal S1 and S2.  There are no murmurs, rubs, or bruits.  Abdomen:  Soft.  She has good bowel sounds.  There is no fluid wave.  There is no palpable hepatosplenomegaly.  Extremities: Show chronic nonpitting edema of the lower legs.  She has good range motion of her joints.  Skin:  Shows seborrheic keratoses.  LABORATORY STUDIES:  White cell count is 41.2.  Hemoglobin 12.6, hematocrit 39.1, platelet count 119.  White cell differential shows 7% segs, 91% lymphocytes.  IMPRESSION:  [name] is an 79 year old white female with stage A chronic lymphocytic leukemia.  Her platelet count is down a little bit today.  She has done this before.  We have been following her now probably for about 3 or 4 years.  I have still not yet seen an indication for any intervention with therapy.  We will plan for another followup in 4 months.    ______________________________ Josph Macho, M.D. PRE/MEDQ  D:  11/10/2012  T:   11/11/2012  Job:  [id number]

## 2012-12-29 ENCOUNTER — Emergency Department (HOSPITAL_COMMUNITY): Payer: Medicare Other

## 2012-12-29 ENCOUNTER — Observation Stay (HOSPITAL_COMMUNITY)
Admission: EM | Admit: 2012-12-29 | Discharge: 2012-12-31 | Disposition: A | Discharge status: Home / Self Care | Payer: Medicare Other | Attending: Internal Medicine | Admitting: Internal Medicine

## 2012-12-29 DIAGNOSIS — I10 Essential (primary) hypertension: Secondary | ICD-10-CM | POA: Insufficient documentation

## 2012-12-29 DIAGNOSIS — Z96659 Presence of unspecified artificial knee joint: Secondary | ICD-10-CM | POA: Insufficient documentation

## 2012-12-29 DIAGNOSIS — I160 Hypertensive urgency: Secondary | ICD-10-CM | POA: Diagnosis present

## 2012-12-29 DIAGNOSIS — M79609 Pain in unspecified limb: Secondary | ICD-10-CM | POA: Insufficient documentation

## 2012-12-29 DIAGNOSIS — C9110 Chronic lymphocytic leukemia of B-cell type not having achieved remission: Secondary | ICD-10-CM

## 2012-12-29 DIAGNOSIS — Z7982 Long term (current) use of aspirin: Secondary | ICD-10-CM | POA: Insufficient documentation

## 2012-12-29 DIAGNOSIS — R079 Chest pain, unspecified: Principal | ICD-10-CM | POA: Insufficient documentation

## 2012-12-29 DIAGNOSIS — R519 Headache, unspecified: Secondary | ICD-10-CM

## 2012-12-29 DIAGNOSIS — R51 Headache: Secondary | ICD-10-CM | POA: Insufficient documentation

## 2012-12-29 HISTORY — DX: Essential (primary) hypertension: I10

## 2012-12-29 HISTORY — DX: Unspecified osteoarthritis, unspecified site: M19.90

## 2012-12-29 LAB — BASIC METABOLIC PANEL
BUN: 15 mg/dL (ref 6–23)
CO2: 27 mEq/L (ref 19–32)
Calcium: 9.9 mg/dL (ref 8.4–10.5)
Chloride: 98 mEq/L (ref 96–112)
GFR calc non Af Amer: 81 mL/min — ABNORMAL LOW (ref >90)
Potassium: 4.7 mEq/L (ref 3.5–5.1)
Sodium: 135 mEq/L (ref 135–145)

## 2012-12-29 LAB — CBC
Hemoglobin: 13 g/dL (ref 12.0–15.0)
MCH: 28.6 pg (ref 26.0–34.0)
MCV: 85.1 fL (ref 78.0–100.0)
Platelets: 121 10*3/uL — ABNORMAL LOW (ref 150–400)
RBC: 4.55 MIL/uL (ref 3.87–5.11)
WBC: 43.3 10*3/uL — ABNORMAL HIGH (ref 4.0–10.5)

## 2012-12-29 LAB — POCT I-STAT TROPONIN I: Troponin i, poc: 0 ng/mL (ref 0.00–0.08)

## 2012-12-29 LAB — PRO B NATRIURETIC PEPTIDE: Pro B Natriuretic peptide (BNP): 484.5 pg/mL — ABNORMAL HIGH (ref 0–450)

## 2012-12-29 MED ORDER — ACETAMINOPHEN 325 MG PO TABS
650.0000 mg | ORAL_TABLET | Freq: Once | ORAL | Status: DC
  Filled 2012-12-29: qty 2

## 2012-12-29 MED ORDER — LABETALOL HCL 5 MG/ML IV SOLN
10.0000 mg | Freq: Once | INTRAVENOUS | Status: AC
  Administered 2012-12-29: 10 mg via INTRAVENOUS
  Filled 2012-12-29: qty 4

## 2012-12-29 NOTE — ED Notes (Addendum)
Woke up today - started with h/a, radiating down to bag, then chest pressure; bp high ~ 190/91; [facility] 180/80; took evening dose of losartan to lower bp; inc. Sob. Was at [facility] and told to come here.

## 2012-12-29 NOTE — ED Provider Notes (Signed)
CSN: [id number]     Arrival date & time 12/29/12  2047 History   First MD Initiated Contact with Patient 12/29/12 2123     Chief Complaint  Patient presents with  . Chest Pain   (Consider location/radiation/quality/duration/timing/severity/associated sxs/prior Treatment) Patient is a 79 y.o. female presenting with chest pain. The history is provided by the patient.  Chest Pain Pain location:  Substernal area Pain quality: pressure   Pain radiates to:  Does not radiate Pain radiates to the back: no   Pain severity:  Moderate Onset quality:  Gradual Timing:  Constant Progression:  Resolved Chronicity:  New Context: at rest   Relieved by:  Nothing Ineffective treatments:  Aspirin Associated symptoms: headache, nausea and shortness of breath   Associated symptoms: no abdominal pain, no back pain, no cough, no diaphoresis, no fever, no heartburn and not vomiting   Risk factors: hypertension   Risk factors: no coronary artery disease, no prior DVT/PE and no smoking     Past Medical History  Diagnosis Date  . Total knee replacement status   . Knee pain, left   . CLL (chronic lymphocytic leukemia) 11/10/2012   No past surgical history on file. No family history on file. History  Substance Use Topics  . Smoking status: Not on file  . Smokeless tobacco: Not on file  . Alcohol Use: Not on file   OB History   Grav Para Term Preterm Abortions TAB SAB Ect Mult Living                 Review of Systems  Constitutional: Negative for fever and diaphoresis.  HENT: Negative for congestion and rhinorrhea.   Respiratory: Positive for shortness of breath. Negative for cough.   Cardiovascular: Positive for chest pain.  Gastrointestinal: Positive for nausea. Negative for heartburn, vomiting, abdominal pain, diarrhea and constipation.  Genitourinary: Negative for difficulty urinating.  Musculoskeletal: Negative for back pain.  Skin: Negative for color change, pallor, rash and wound.   Neurological: Positive for headaches.  All other systems reviewed and are negative.    Allergies  Review of patient's allergies indicates no known allergies.  Home Medications   Current Outpatient Rx  Name  Route  Sig  Dispense  Refill  . Ascorbic Acid (VITAMIN C PO)   Oral   Take 1 tablet by mouth daily.         Marland Kitchen aspirin 325 MG tablet   Oral   Take 325 mg by mouth daily as needed for pain.         . Cholecalciferol (VITAMIN D-3 PO)   Oral   Take 1 capsule by mouth daily.         Marland Kitchen ibuprofen (ADVIL,MOTRIN) 200 MG tablet   Oral   Take 600 mg by mouth every 6 (six) hours as needed for pain.         Marland Kitchen losartan (COZAAR) 25 MG tablet   Oral   Take 25 mg by mouth daily.         . Niacin (VITAMIN B-3 PO)   Oral   Take 1 tablet by mouth daily.         Marland Kitchen OVER THE COUNTER MEDICATION   Oral   Take 1 tablet by mouth daily. Over the counter eye health supplement         . VITAMIN E PO   Oral   Take 1 tablet by mouth daily.          BP 159/83  Pulse 67  Temp(Src) 98.7 F (37.1 C) (Oral)  Resp 17  Ht 5\' 2"  (1.575 m)  Wt 204 lb 7 oz (92.732 kg)  BMI 37.38 kg/m2  SpO2 90% Physical Exam  Nursing note and vitals reviewed. Constitutional: She is oriented to person, place, and time. She appears well-developed and well-nourished. No distress.  Well appearing female who appears her stated age in NAD  HENT:  Head: Normocephalic and atraumatic.  Mouth/Throat: Oropharynx is clear and moist. No oropharyngeal exudate.  Eyes: Conjunctivae and EOM are normal. Pupils are equal, round, and reactive to light.  Neck: Normal range of motion. Neck supple.  Cardiovascular: Normal rate, regular rhythm and normal heart sounds.  Exam reveals no gallop and no friction rub.   No murmur heard. Pulmonary/Chest: Effort normal and breath sounds normal. No respiratory distress. She has no wheezes. She has no rales. She exhibits no tenderness.  Abdominal: Soft. She exhibits no  distension and no mass. There is no tenderness. There is no rebound and no guarding.  Musculoskeletal: Normal range of motion. She exhibits no edema and no tenderness.  Lymphadenopathy:    She has no cervical adenopathy.  Neurological: She is alert and oriented to person, place, and time. She has normal strength. No cranial nerve deficit or sensory deficit. Coordination and gait normal. GCS eye subscore is 4. GCS verbal subscore is 5. GCS motor subscore is 6.  Reflex Scores:      Bicep reflexes are 2+ on the right side and 2+ on the left side.      Patellar reflexes are 2+ on the right side and 2+ on the left side. Skin: Skin is warm and dry. No rash noted. She is not diaphoretic.  Psychiatric: She has a normal mood and affect. Her behavior is normal. Judgment and thought content normal.    ED Course  Procedures (including critical care time) Labs Review Labs Reviewed  CBC - Abnormal; Notable for the following:    WBC 43.3 (*)    Platelets 121 (*)    All other components within normal limits  BASIC METABOLIC PANEL - Abnormal; Notable for the following:    Glucose, Bld 103 (*)    GFR calc non Af Amer 81 (*)    All other components within normal limits  PRO B NATRIURETIC PEPTIDE - Abnormal; Notable for the following:    Pro B Natriuretic peptide (BNP) 484.5 (*)    All other components within normal limits  POCT I-STAT TROPONIN I   Imaging Review Dg Chest 2 View  12/29/2012   CLINICAL DATA:  Cough and chest pain  EXAM: CHEST  2 VIEW  COMPARISON:  Most recent chest radiograph 10/21/2006, chest CT 01/02/2009  FINDINGS: Moderate enlargement of the cardiac silhouette is reidentified with central vascular congestion but no overt edema. The patient is rotated to the left. No gross evidence for focal pulmonary opacity. No pleural effusion. Kyphosis with multiple central thoracic compression deformities reidentified.  IMPRESSION: Cardiomegaly without focal acute finding.   Electronically Signed    By: Christiana Pellant M.D.   On: 12/29/2012 22:19   Ct Head Wo Contrast  12/29/2012   CLINICAL DATA:  Blurred vision, elevated blood pressure  EXAM: CT HEAD WITHOUT CONTRAST  TECHNIQUE: Contiguous axial images were obtained from the base of the skull through the vertex without intravenous contrast.  COMPARISON:  03/06/2008 C-spine CT. No similar prior exam.  FINDINGS: Mild cortical volume loss noted with proportional ventricular prominence. Areas of periventricular white matter  hypodensity are most compatible with small vessel ischemic change. No midline shift. No acute hemorrhage, infarct, or mass lesion is identified. Mild ethmoid and maxillary mucoperiosteal thickening noted. Right maxillary sinus chronic sinusitis or hypoplasia noted. The globes are unremarkable. No skull fracture.  IMPRESSION: Sinusitis. No acute intracranial finding.  Chronic findings as above.   Electronically Signed   By: Christiana Pellant M.D.   On: 12/29/2012 22:27    MDM   1. Chest pain   2. Headache   3. Hypertension     The patient is an 79 year old female with a history of CLL, hypertension managed on Cozaar who presents with headache, chest pressure, hypertension, nausea and the patient was evaluated at [facility] urgent care where she was found to have a right bundle branch block as well as hypertension to 190s systolic. She was referred to this institution for further evaluation and workup based on EKG changes and hypertension. The patient states that her blood pressure regimen on 25 mg of Cozaar has not been changed in a number of years. She otherwise denies vomiting, dyspnea or, fevers, cough. Went to urgent care and Asprin given (two at home and two at urgent care) to make it to 324 mg.  Arrival patient is hypertensive to 170 and repeat blood pressure was trending upwards towards 190 systolic. Will evaluate for hypertensive emergency with CT head, EKG, chest x-ray, basic labs, troponin, BNP. Patient's chest pressure  somewhat typical for ACS and that is higher on the differential. Patient has a low wells score and think that PE is unlikely in this patient without dyspnea or pleuritic chest pain.   Chest x-ray returned showing mild cardiomegaly but no interstitial edema and no consolidations. EKG as documented above shows right bundle branch block that is not new since 2008. Otherwise no ischemic changes. CT head shows no signs of intracranial hemorrhage or CVA. BNP returns intermediately elevated at 485, however no dyspnea or signs of volume overload[place]ocytosis [place] [address] near baseline with CLL. Troponin negative. Feel will likely need evaluation for ACS rule out. Will consult hospitalist for admission.   Date: 12/29/2012  Rate: 79  Rhythm: normal sinus rhythm  QRS Axis: normal  Intervals: normal  ST/T Wave abnormalities: normal  Conduction Disutrbances:right bundle branch block  Narrative Interpretation:   Old EKG Reviewed: unchanged  Patient was discussed with my attending, Dr. Ethelda Chick.  Dorna Leitz, MD 12/30/12 [phone]

## 2012-12-30 ENCOUNTER — Encounter (HOSPITAL_COMMUNITY): Payer: Self-pay | Admitting: *Deleted

## 2012-12-30 DIAGNOSIS — I160 Hypertensive urgency: Secondary | ICD-10-CM | POA: Diagnosis present

## 2012-12-30 DIAGNOSIS — R079 Chest pain, unspecified: Secondary | ICD-10-CM

## 2012-12-30 LAB — TROPONIN I
Troponin I: <0.3 ng/mL (ref <0.30)
Troponin I: <0.3 ng/mL (ref <0.30)

## 2012-12-30 MED ORDER — ACETAMINOPHEN 325 MG PO TABS: 650.0000 mg | ORAL_TABLET | ORAL | Status: DC | PRN

## 2012-12-30 MED ORDER — LABETALOL HCL 5 MG/ML IV SOLN: 10.0000 mg | INTRAVENOUS | Status: DC | PRN

## 2012-12-30 MED ORDER — ASPIRIN 325 MG PO TABS
325.0000 mg | ORAL_TABLET | Freq: Every day | ORAL | Status: DC | PRN
  Filled 2012-12-30: qty 1

## 2012-12-30 MED ORDER — LOSARTAN POTASSIUM 25 MG PO TABS
25.0000 mg | ORAL_TABLET | Freq: Every day | ORAL | Status: DC
  Administered 2012-12-30: 25 mg via ORAL
  Filled 2012-12-30 (×2): qty 1

## 2012-12-30 MED ORDER — ONDANSETRON HCL 4 MG/2ML IJ SOLN: 4.0000 mg | Freq: Four times a day (QID) | INTRAMUSCULAR | Status: DC | PRN

## 2012-12-30 MED ORDER — FLUTICASONE PROPIONATE 50 MCG/ACT NA SUSP
1.0000 | Freq: Every day | NASAL | Status: DC
  Administered 2012-12-30: 1 via NASAL
  Filled 2012-12-30: qty 16

## 2012-12-30 MED ORDER — HEPARIN SODIUM (PORCINE) 5000 UNIT/ML IJ SOLN
5000.0000 [IU] | Freq: Three times a day (TID) | INTRAMUSCULAR | Status: DC
  Administered 2012-12-30 – 2012-12-31 (×4): 5000 [IU] via SUBCUTANEOUS
  Filled 2012-12-30 (×7): qty 1

## 2012-12-30 NOTE — H&P (Signed)
[facility] History and Physical  [name] OZH:[id number] DOB: 1928/07/22 DOA: 12/29/2012  Referring physician: ED PCP: Bridget Hoff, MD   Chief Complaint: Chest pain  HPI: [name] is a 79 y.o. female who presents to the ED with c/o chest pressure and mild headache.  Symptoms onset earlier today, patient went to [facility] urgent care at [facility] where she was found to have SBP of 190s (very high for the patient who is normally just on losartan 25mg  and no other BP meds).  She was also found to have a RBBB on EKG with no old EKG available to them at that time (RBBB is in-fact old as demonstrated on 2008 EKG available to our EDP).  Patient was transferred to the ED.  In the ED she was given labetalol which resolved her chest pressure and her headache and brought her SBP down into the 170s.  Hospitalist has been asked to admit as CP r/o cardiac and/or hypertensive urgency.  Review of Systems: 12 systems reviewed and otherwise negative.  Past Medical History  Diagnosis Date  . Total knee replacement status   . Knee pain, left   . CLL (chronic lymphocytic leukemia) 11/10/2012  . Hypertension   . Arthritis    Past Surgical History  Procedure Laterality Date  . Appendectomy    . Colon surgery    . Hernia repair      left  . Joint replacement      Bilateral  . Carpel tunnel surgery      bilateral  . Cholecystectomy     Social History:  reports that she has quit smoking. Her smoking use included Cigarettes. She smoked 0.00 packs per day. She does not have any smokeless tobacco history on file. She reports that  drinks alcohol. Her drug history is not on file.   No Known Allergies  History reviewed. No pertinent family history.  Prior to Admission medications   Medication Sig Start Date End Date Taking? Authorizing Provider  Ascorbic Acid (VITAMIN C PO) Take 1 tablet by mouth daily.   Yes Historical Provider, MD  aspirin 325 MG tablet Take 325 mg by mouth daily as needed  for pain.   Yes Historical Provider, MD  Cholecalciferol (VITAMIN D-3 PO) Take 1 capsule by mouth daily.   Yes Historical Provider, MD  ibuprofen (ADVIL,MOTRIN) 200 MG tablet Take 600 mg by mouth every 6 (six) hours as needed for pain.   Yes Historical Provider, MD  losartan (COZAAR) 25 MG tablet Take 25 mg by mouth daily.   Yes Historical Provider, MD  Niacin (VITAMIN B-3 PO) Take 1 tablet by mouth daily.   Yes Historical Provider, MD  OVER THE COUNTER MEDICATION Take 1 tablet by mouth daily. Over the counter eye health supplement   Yes Historical Provider, MD  VITAMIN E PO Take 1 tablet by mouth daily.   Yes Historical Provider, MD   Physical Exam: Filed Vitals:   12/30/12 0127  BP: 173/81  Pulse: 86  Temp: 97.9 F (36.6 C)  Resp: 18    General:  NAD, resting comfortably in bed Eyes: PEERLA EOMI ENT: mucous membranes moist Neck: supple w/o JVD Cardiovascular: RRR w/o MRG Respiratory: CTA B Abdomen: soft, nt, nd, bs+ Skin: no rash nor lesion Musculoskeletal: MAE, full ROM all 4 extremities Psychiatric: normal tone and affect Neurologic: AAOx3, grossly non-focal  Labs on Admission:  Basic Metabolic Panel:  Recent Labs Lab 12/29/12 2059  NA 135  K 4.7  CL 98  CO2 27  GLUCOSE 103*  BUN 15  CREATININE 0.64  CALCIUM 9.9   Liver Function Tests: No results found for this basename: AST, ALT, ALKPHOS, BILITOT, PROT, ALBUMIN,  in the last 168 hours No results found for this basename: LIPASE, AMYLASE,  in the last 168 hours No results found for this basename: AMMONIA,  in the last 168 hours CBC:  Recent Labs Lab 12/29/12 2059  WBC 43.3*  HGB 13.0  HCT 38.7  MCV 85.1  PLT 121*   Cardiac Enzymes: No results found for this basename: CKTOTAL, CKMB, CKMBINDEX, TROPONINI,  in the last 168 hours  BNP (last 3 results)  Recent Labs  12/29/12 2059  PROBNP 484.5*   CBG: No results found for this basename: GLUCAP,  in the last 168 hours  Radiological Exams on  Admission: Dg Chest 2 View  12/29/2012   CLINICAL DATA:  Cough and chest pain  EXAM: CHEST  2 VIEW  COMPARISON:  Most recent chest radiograph 10/21/2006, chest CT 01/02/2009  FINDINGS: Moderate enlargement of the cardiac silhouette is reidentified with central vascular congestion but no overt edema. The patient is rotated to the left. No gross evidence for focal pulmonary opacity. No pleural effusion. Kyphosis with multiple central thoracic compression deformities reidentified.  IMPRESSION: Cardiomegaly without focal acute finding.   Electronically Signed   By: Christiana Pellant M.D.   On: 12/29/2012 22:19   Ct Head Wo Contrast  12/29/2012   CLINICAL DATA:  Blurred vision, elevated blood pressure  EXAM: CT HEAD WITHOUT CONTRAST  TECHNIQUE: Contiguous axial images were obtained from the base of the skull through the vertex without intravenous contrast.  COMPARISON:  03/06/2008 C-spine CT. No similar prior exam.  FINDINGS: Mild cortical volume loss noted with proportional ventricular prominence. Areas of periventricular white matter hypodensity are most compatible with small vessel ischemic change. No midline shift. No acute hemorrhage, infarct, or mass lesion is identified. Mild ethmoid and maxillary mucoperiosteal thickening noted. Right maxillary sinus chronic sinusitis or hypoplasia noted. The globes are unremarkable. No skull fracture.  IMPRESSION: Sinusitis. No acute intracranial finding.  Chronic findings as above.   Electronically Signed   By: Christiana Pellant M.D.   On: 12/29/2012 22:27    EKG: Independently reviewed. RBBB, grossly unchanged when compared to EKG in 2008  Assessment/Plan Principal Problem:   Chest pain Active Problems:   Hypertensive urgency   1. Chest pain - putting patient on CP obs protocol, HEART score 5, but think that her CP may be more due to HTN urgency than new ACS.  Trops pending, NPO in case cards wants to stress test. 2. HTN urgency - improved, symptoms resolved  after labetalol, wrote PRN labetalol order for SBP > 180    Code Status: Full Code (must indicate code status--if unknown or must be presumed, indicate so) Family Communication: Spoke with daughter at bedside (indicate person spoken with, if applicable, with phone number if by telephone) Disposition Plan: Admit to obs (indicate anticipated LOS)  Time spent: 70 min  GARDNER, JARED M. [facility] Pager [phone]  If 7PM-7AM, please contact night-coverage [email or web address] Password [email or web address] 12/30/2012, 2:50 AM

## 2012-12-30 NOTE — Care Management Utilization Note (Signed)
UR completed.    Orman Matsumura Wise Nola Botkins, RN, BSN Phone #[phone]  

## 2012-12-30 NOTE — Progress Notes (Signed)
Patient admitted early this AM.  Chest pain free.  Will order CE and cycle.  Tele ok. Plan to d/c in AM unless enzymes change to positive Will give diet Marlin Canary

## 2012-12-30 NOTE — ED Provider Notes (Signed)
I have personally seen and examined the patient.  I have discussed the plan of care with the resident.  I have reviewed the documentation on PMH/FH/Soc. History.  I have reviewed the documentation of the resident and agree.  Doug Sou, MD 12/30/12 [phone]

## 2012-12-30 NOTE — ED Provider Notes (Signed)
Patient complained of anterior chest pain described as pressure onset approximately 1 PM today lasted 5 hours resolve spontaneously. Also had mild headache earlier today which has since resolved.  Doug Sou, MD 12/30/12 [phone]

## 2012-12-31 DIAGNOSIS — I10 Essential (primary) hypertension: Secondary | ICD-10-CM

## 2012-12-31 DIAGNOSIS — C9110 Chronic lymphocytic leukemia of B-cell type not having achieved remission: Secondary | ICD-10-CM

## 2012-12-31 MED ORDER — AMLODIPINE BESYLATE 5 MG PO TABS
5.0000 mg | ORAL_TABLET | Freq: Every day | ORAL | Status: DC
  Filled 2012-12-31: qty 1

## 2012-12-31 MED ORDER — LOSARTAN POTASSIUM 50 MG PO TABS: 50.0000 mg | ORAL_TABLET | Freq: Every day | ORAL | Status: DC

## 2012-12-31 MED ORDER — AMLODIPINE BESYLATE 5 MG PO TABS: 5.0000 mg | ORAL_TABLET | Freq: Every day | ORAL | Status: AC

## 2012-12-31 MED ORDER — LOSARTAN POTASSIUM 50 MG PO TABS
50.0000 mg | ORAL_TABLET | Freq: Every day | ORAL | Status: DC
  Filled 2012-12-31: qty 1

## 2012-12-31 NOTE — Discharge Summary (Signed)
Physician Discharge Summary  Perry Molla Kitko ZOX:[id number] DOB: 06/02/1928 DOA: 12/29/2012  PCP: Sissy Hoff, MD  Admit date: 12/29/2012 Discharge date: 12/31/2012  Time spent: 35 minutes  Recommendations for Outpatient Follow-up:  1. Titration of BP meds  2. BP check in 1 week  Discharge Diagnoses:  Principal Problem:   Chest pain Active Problems:   Hypertensive urgency   Discharge Condition: improved  Diet recommendation: cardiac  Filed Weights   12/30/12 0127 12/30/12 0431 12/31/12 0500  Weight: 91.128 kg (200 lb 14.4 oz) 91.128 kg (200 lb 14.4 oz) 90.719 kg (200 lb)    History of present illness:  [name] is a 79 y.o. female who presents to the ED with c/o chest pressure and mild headache. Symptoms onset earlier today, patient went to [facility] urgent care at [facility] where she was found to have SBP of 190s (very high for the patient who is normally just on losartan 25mg  and no other BP meds). She was also found to have a RBBB on EKG with no old EKG available to them at that time (RBBB is in-fact old as demonstrated on 2008 EKG available to our EDP). Patient was transferred to the ED. In the ED she was given labetalol which resolved her chest pressure and her headache and brought her SBP down into the 170s. Hospitalist has been asked to admit as CP r/o cardiac and/or hypertensive urgency.   Hospital Course:  Chest pain- resolved with improvements in blood pressure; increased sartan and added norvasc- will need continued BP management as outpatient, CE negative  Procedures:    Consultations:    Discharge Exam: Filed Vitals:   12/31/12 0500  BP: 160/79  Pulse: 77  Temp: 97.3 F (36.3 C)  Resp: 16    General: A+Ox3, NAd Cardiovascular: rrr Respiratory: clear  Discharge Instructions      Discharge Orders   Future Appointments Provider Department Dept Phone   03/12/2013 9:30 AM Rachael Fee [facility] [phone]    03/12/2013 10:00 AM Josph Macho, MD [facility] [phone]   Future Orders Complete By Expires   Diet - low sodium heart healthy  As directed    Discharge instructions  As directed    Comments:     Bmp 1 week   Increase activity slowly  As directed        Medication List    STOP taking these medications       ibuprofen 200 MG tablet  Commonly known as:  ADVIL,MOTRIN     losartan-hydrochlorothiazide 50-12.5 MG per tablet  Commonly known as:  HYZAAR      TAKE these medications       amLODipine 5 MG tablet  Commonly known as:  NORVASC  Take 1 tablet (5 mg total) by mouth daily.     aspirin 325 MG tablet  Take 325 mg by mouth daily as needed for pain.     losartan 50 MG tablet  Commonly known as:  COZAAR  Take 1 tablet (50 mg total) by mouth daily.     OVER THE COUNTER MEDICATION  Take 1 tablet by mouth daily. Over the counter eye health supplement     VITAMIN B-3 PO  Take 1 tablet by mouth daily.     VITAMIN C PO  Take 1 tablet by mouth daily.     VITAMIN D-3 PO  Take 1 capsule by mouth daily.     VITAMIN  E PO  Take 1 tablet by mouth daily.       No Known Allergies Follow-up Information   Follow up with Sissy Hoff, MD In 1 week. (for BP check)    Specialty:  Family Medicine   Contact information:   [address], [address] [place] [address] [phone]        The results of significant diagnostics from this hospitalization (including imaging, microbiology, ancillary and laboratory) are listed below for reference.    Significant Diagnostic Studies: Dg Chest 2 View  12/29/2012   CLINICAL DATA:  Cough and chest pain  EXAM: CHEST  2 VIEW  COMPARISON:  Most recent chest radiograph 10/21/2006, chest CT 01/02/2009  FINDINGS: Moderate enlargement of the cardiac silhouette is reidentified with central vascular congestion but no overt edema. The patient is rotated to the left. No gross evidence for focal pulmonary  opacity. No pleural effusion. Kyphosis with multiple central thoracic compression deformities reidentified.  IMPRESSION: Cardiomegaly without focal acute finding.   Electronically Signed   By: Christiana Pellant M.D.   On: 12/29/2012 22:19   Ct Head Wo Contrast  12/29/2012   CLINICAL DATA:  Blurred vision, elevated blood pressure  EXAM: CT HEAD WITHOUT CONTRAST  TECHNIQUE: Contiguous axial images were obtained from the base of the skull through the vertex without intravenous contrast.  COMPARISON:  03/06/2008 C-spine CT. No similar prior exam.  FINDINGS: Mild cortical volume loss noted with proportional ventricular prominence. Areas of periventricular white matter hypodensity are most compatible with small vessel ischemic change. No midline shift. No acute hemorrhage, infarct, or mass lesion is identified. Mild ethmoid and maxillary mucoperiosteal thickening noted. Right maxillary sinus chronic sinusitis or hypoplasia noted. The globes are unremarkable. No skull fracture.  IMPRESSION: Sinusitis. No acute intracranial finding.  Chronic findings as above.   Electronically Signed   By: Christiana Pellant M.D.   On: 12/29/2012 22:27    Microbiology: No results found for this or any previous visit (from the past 240 hour(s)).   Labs: Basic Metabolic Panel:  Recent Labs Lab 12/29/12 2059  NA 135  K 4.7  CL 98  CO2 27  GLUCOSE 103*  BUN 15  CREATININE 0.64  CALCIUM 9.9   Liver Function Tests: No results found for this basename: AST, ALT, ALKPHOS, BILITOT, PROT, ALBUMIN,  in the last 168 hours No results found for this basename: LIPASE, AMYLASE,  in the last 168 hours No results found for this basename: AMMONIA,  in the last 168 hours CBC:  Recent Labs Lab 12/29/12 2059  WBC 43.3*  HGB 13.0  HCT 38.7  MCV 85.1  PLT 121*   Cardiac Enzymes:  Recent Labs Lab 12/30/12 0955 12/30/12 1400 12/30/12 1942  TROPONINI <0.30 <0.30 <0.30   BNP: BNP (last 3 results)  Recent Labs   12/29/12 2059  PROBNP 484.5*   CBG: No results found for this basename: GLUCAP,  in the last 168 hours     Signed:  Marlin Canary  [facility] 12/31/2012, 9:10 AM

## 2013-01-12 NOTE — ED Provider Notes (Signed)
agree with resident's ekg interpretation  Doug Sou, MD 01/12/13 [phone]

## 2013-03-12 ENCOUNTER — Ambulatory Visit (HOSPITAL_BASED_OUTPATIENT_CLINIC_OR_DEPARTMENT_OTHER): Payer: Medicare Other | Admitting: Hematology & Oncology

## 2013-03-12 ENCOUNTER — Other Ambulatory Visit (HOSPITAL_BASED_OUTPATIENT_CLINIC_OR_DEPARTMENT_OTHER): Payer: Medicare Other | Admitting: Lab

## 2013-03-12 VITALS — BP 156/65 | HR 79 | Temp 97.7°F | Resp 14 | Ht 62.0 in | Wt 206.0 lb

## 2013-03-12 DIAGNOSIS — C9110 Chronic lymphocytic leukemia of B-cell type not having achieved remission: Secondary | ICD-10-CM

## 2013-03-12 LAB — CBC WITH DIFFERENTIAL (CANCER CENTER ONLY)
HCT: 38.6 % (ref 34.8–46.6)
MCHC: 30.3 g/dL — ABNORMAL LOW (ref 32.0–36.0)
MCV: 90 fL (ref 81–101)
Platelets: 131 10*3/uL — ABNORMAL LOW (ref 145–400)
RDW: 14.9 % (ref 11.1–15.7)
WBC: 48.7 10*3/uL — ABNORMAL HIGH (ref 3.9–10.0)

## 2013-03-12 LAB — MANUAL DIFFERENTIAL (CHCC SATELLITE)
ANC (CHCC HP manual diff): 7.3 10*3/uL — ABNORMAL HIGH (ref 1.5–6.7)
Eos: 3 % (ref 0–7)
MONO: 2 % (ref 0–13)
SEG: 15 % — ABNORMAL LOW (ref 40–75)

## 2013-03-14 NOTE — Progress Notes (Signed)
This office note has been dictated.

## 2013-03-15 NOTE — Progress Notes (Signed)
CC:   [name], M.D.  DIAGNOSIS:  Stage A CLL.  CURRENT THERAPY:  Observation.  INTERIM HISTORY:  [name] comes in for followup.  We saw her back in August.  Since then, she has been doing okay.  Thankfully, the intra- ocular injections are helping her with respect to her macular degeneration.  She is getting Avastin.  She has had no problems with lymph node.  She has had no fever.  She had no sweats.  She has had no weight loss or weight gain.  Her appetite has been good.  She feels pretty good overall.  She has been busy over the holidays.  PHYSICAL EXAMINATION:  General:  This is a well-developed, well- nourished white female, in no obvious distress.  Vital Signs: Temperature of 97.7, pulse 79, respiratory rate 14, blood pressure 156/65.  Weight is 206 pounds.  Head and Neck:  Normocephalic, atraumatic skull.  There are no ocular or oral lesions.  There are no palpable cervical or supraclavicular lymph nodes.  She has no adenopathy in the neck.  Lungs:  Clear bilaterally.  Cardiac:  Regular rate and rhythm with a normal S1 and S2.  There are no murmurs, rubs, or bruits. Abdomen:  Soft.  She has good bowel sounds.  There is no fluid wave. There is no palpable abdominal mass.  There is no palpable hepatosplenomegaly.  Axillary exam shows no bilateral axillary adenopathy.  There may be slight lymph node enlargement in the left axilla.  Back:  No tenderness over the spine, ribs, or hips. Extremities:  Show no clubbing, cyanosis, or edema.  Skin:  Shows no rashes, ecchymosis, or petechia.  LABORATORY STUDIES:  White cell count is 48.7, hemoglobin 11.7, hematocrit 38.6, platelet count 131.  White cell differential shows 15 segs, 18 lymphocytes.  On peripheral smear, she does have an elevated white cell count.  The lymphocytosis shows mostly lymphocytes.  They appear to be mature.  She has some smudge cells.  She has some atypical lymphocytes.  I see no rouleaux  formation.  There is no spherocytes.  Myeloid cells are decreased.  There are no hypersegmented polys.  There is no immature myeloid cells.  Platelets are adequate in number and size.  IMPRESSION:  [name] is a very charming 79 year old white female. She has stage A chronic lymphocytic leukemia.  We have been seeing her now probably for good 4 years or so.  Her white cell count does tend to fluctuate.  We will continue to follow her along considerably.  She does not have other health issues.  I did not see that the chronic lymphocytic leukemia, right now, is going to be a problem.  We will plan to get her back after the wintertime now.  I do not want her and her husband coming out in a potentially bad weather.  We will go ahead and plan to see her back in the springtime.  It would not surprise me if we did have to initiate therapy on her next year.  If so, with some of the newer program that we have, we will certainly make it little bit more tolerable for her.    ______________________________ Josph Macho, M.D. PRE/MEDQ  D:  03/14/2013  T:  03/15/2013  Job:  [id number]

## 2013-05-23 ENCOUNTER — Telehealth: Payer: Self-pay | Admitting: Hematology & Oncology

## 2013-05-23 NOTE — Telephone Encounter (Signed)
Pt moved 4-13 to 4-20

## 2013-07-09 ENCOUNTER — Ambulatory Visit: Payer: Medicare Other | Admitting: Hematology & Oncology

## 2013-07-09 ENCOUNTER — Other Ambulatory Visit: Payer: Medicare Other | Admitting: Lab

## 2013-07-16 ENCOUNTER — Encounter: Payer: Self-pay | Admitting: Hematology & Oncology

## 2013-07-16 ENCOUNTER — Other Ambulatory Visit (HOSPITAL_BASED_OUTPATIENT_CLINIC_OR_DEPARTMENT_OTHER): Payer: Medicare Other | Admitting: Lab

## 2013-07-16 ENCOUNTER — Ambulatory Visit (HOSPITAL_BASED_OUTPATIENT_CLINIC_OR_DEPARTMENT_OTHER): Payer: Medicare Other | Admitting: Hematology & Oncology

## 2013-07-16 VITALS — BP 155/68 | HR 66 | Temp 97.7°F | Resp 14 | Ht 64.0 in | Wt 203.0 lb

## 2013-07-16 DIAGNOSIS — D696 Thrombocytopenia, unspecified: Secondary | ICD-10-CM

## 2013-07-16 DIAGNOSIS — C9110 Chronic lymphocytic leukemia of B-cell type not having achieved remission: Secondary | ICD-10-CM

## 2013-07-16 LAB — CBC WITH DIFFERENTIAL (CANCER CENTER ONLY)
BASO#: 0 10*3/uL (ref 0.0–0.2)
BASO%: 0 % (ref 0.0–2.0)
EOS%: 0.5 % (ref 0.0–7.0)
Eosinophils Absolute: 0.3 10*3/uL (ref 0.0–0.5)
HEMATOCRIT: 39.5 % (ref 34.8–46.6)
HGB: 12.4 g/dL (ref 11.6–15.9)
LYMPH#: 44.4 10*3/uL — AB (ref 0.9–3.3)
LYMPH%: 91.9 % — ABNORMAL HIGH (ref 14.0–48.0)
MCH: 27.7 pg (ref 26.0–34.0)
MCHC: 31.4 g/dL — ABNORMAL LOW (ref 32.0–36.0)
MCV: 88 fL (ref 81–101)
MONO#: 0.5 10*3/uL (ref 0.1–0.9)
MONO%: 1 % (ref 0.0–13.0)
NEUT#: 3.1 10*3/uL (ref 1.5–6.5)
NEUT%: 6.6 % — ABNORMAL LOW (ref 39.6–80.0)
PLATELETS: 118 10*3/uL — AB (ref 145–400)
RBC: 4.47 10*6/uL (ref 3.70–5.32)
RDW: 15.6 % (ref 11.1–15.7)
WBC: 48.3 10*3/uL — AB (ref 3.9–10.0)

## 2013-07-16 LAB — CHCC SATELLITE - SMEAR

## 2013-07-16 LAB — TECHNOLOGIST REVIEW CHCC SATELLITE

## 2013-07-16 NOTE — Progress Notes (Signed)
Hematology and Oncology Follow Up Visit  [name] [id number] 08-Oct-1928 80 y.o. 07/16/2013   Principle Diagnosis:  Chronic lymphocytic leukemia-stage A   Current Therapy:    Observation     Interim History:  [name] is back for followup. Saw her 4 months ago. She's had no problems since we last saw her. She's been doing fairly well. She's not had any problems with nausea vomiting. She's had no fevers. There's been no issues with infections. She's had no cough or shortness of breath. She's had no cardiac difficulties. There's been no leg swelling. She's not noted any palpable lymph glands.  Medications: Current outpatient prescriptions:amLODipine (NORVASC) 5 MG tablet, Take 1 tablet (5 mg total) by mouth daily., Disp: 30 tablet, Rfl: 0;  Ascorbic Acid (VITAMIN C PO), Take 1 tablet by mouth daily., Disp: , Rfl: ;  aspirin 325 MG tablet, Take 325 mg by mouth daily as needed for pain., Disp: , Rfl: ;  Cholecalciferol (VITAMIN D-3 PO), Take 1 capsule by mouth daily., Disp: , Rfl: ;  Coenzyme Q10 (CO Q 10 PO), Take by mouth every morning., Disp: , Rfl:  Cyanocobalamin (VITAMIN B 12 PO), Take by mouth every morning., Disp: , Rfl: ;  losartan (COZAAR) 50 MG tablet, Take 1 tablet (50 mg total) by mouth daily., Disp: 30 tablet, Rfl: 0;  OVER THE COUNTER MEDICATION, Take 1 tablet by mouth daily. Over the counter eye health supplement, Disp: , Rfl: ;  psyllium (METAMUCIL) 0.52 G capsule, Take 0.52 g by mouth as needed., Disp: , Rfl: ;  VITAMIN E PO, Take 1 tablet by mouth daily., Disp: , Rfl:   Allergies: No Known Allergies  Past Medical History, Surgical history, Social history, and Family History were reviewed and updated.  Review of Systems: As above  Physical Exam:  height is 5\' 4"  (1.626 m) and weight is 203 lb (92.08 kg). Her oral temperature is 97.7 F (36.5 C). Her blood pressure is 155/68 and her pulse is 66. Her respiration is 14.   Somewhat obese white female in no obvious  distress. Her head exam shows no adenopathy in the neck. There is no ocular or oral lesions. She has no thyroid is palpable. Lungs are clear. Cardiac exam regular in rhythm with no murmurs rubs or bruits. Abdomen is soft. Good bowel sounds. There is no fluid wave. There is a palpable abdominal mass. There is no palpable liver or spleen tip. Extremities shows no clubbing cyanosis or edema. Just age-related osteoarthritic changes. Exam shows some slight kyphosis. Axillary exam shows no bilateral axillary adenopathy.  Lab Results  Component Value Date   WBC 48.3* 07/16/2013   HGB 12.4 07/16/2013   HCT 39.5 07/16/2013   MCV 88 07/16/2013   PLT 118* 07/16/2013     Chemistry      Component Value Date/Time   NA 135 12/29/2012 2059   NA 142 01/02/2009 0919   K 4.7 12/29/2012 2059   K 4.4 01/02/2009 0919   CL 98 12/29/2012 2059   CL 101 01/02/2009 0919   CO2 27 12/29/2012 2059   CO2 31 01/02/2009 0919   BUN 15 12/29/2012 2059   BUN 21 01/02/2009 0919   CREATININE 0.64 12/29/2012 2059   CREATININE 0.9 01/02/2009 0919      Component Value Date/Time   CALCIUM 9.9 12/29/2012 2059   CALCIUM 10.3 01/02/2009 0919   ALKPHOS 70 01/02/2009 0919   ALKPHOS 78 02/28/2008 1317   AST 16 01/02/2009 0919  AST 13 02/28/2008 1317   ALT 13 01/02/2009 0919   ALT 13 02/28/2008 1317   BILITOT 0.70 01/02/2009 0919   BILITOT 0.5 02/28/2008 1317         Impression and Plan: [name] is an 80 -year-old white female with CLL. She has stage A disease. We have been following her for several years. Her white cell count has gradually been trending upward. I noted that her platelet count was down a little bit today.  I think that we can just follow prolonged than 6 months now. Again she has other comorbid conditions wintry have to keep in mind.  I will go ahead and plan to see her back in 6 months.   [name], MD 4/20/20151:31 PM

## 2013-07-19 ENCOUNTER — Telehealth: Payer: Self-pay | Admitting: Hematology & Oncology

## 2013-07-19 NOTE — Telephone Encounter (Signed)
Left message with 10-12 appointment °

## 2013-08-02 ENCOUNTER — Telehealth: Payer: Self-pay | Admitting: Hematology & Oncology

## 2013-08-02 NOTE — Telephone Encounter (Signed)
Pt moved 10-12 to 10-5

## 2013-12-31 ENCOUNTER — Other Ambulatory Visit (HOSPITAL_BASED_OUTPATIENT_CLINIC_OR_DEPARTMENT_OTHER): Payer: Medicare Other | Admitting: Lab

## 2013-12-31 ENCOUNTER — Ambulatory Visit (HOSPITAL_BASED_OUTPATIENT_CLINIC_OR_DEPARTMENT_OTHER): Payer: Medicare Other | Admitting: Hematology & Oncology

## 2013-12-31 VITALS — BP 154/71 | HR 71 | Temp 97.7°F | Resp 16 | Wt 193.0 lb

## 2013-12-31 DIAGNOSIS — C9110 Chronic lymphocytic leukemia of B-cell type not having achieved remission: Secondary | ICD-10-CM

## 2013-12-31 LAB — CBC WITH DIFFERENTIAL (CANCER CENTER ONLY)
BASO#: 0 10*3/uL (ref 0.0–0.2)
BASO%: 0 % (ref 0.0–2.0)
EOS ABS: 0.3 10*3/uL (ref 0.0–0.5)
EOS%: 0.6 % (ref 0.0–7.0)
HCT: 41 % (ref 34.8–46.6)
HGB: 12.9 g/dL (ref 11.6–15.9)
LYMPH#: 46.7 10*3/uL — ABNORMAL HIGH (ref 0.9–3.3)
LYMPH%: 91.5 % — ABNORMAL HIGH (ref 14.0–48.0)
MCH: 28.2 pg (ref 26.0–34.0)
MCHC: 31.5 g/dL — ABNORMAL LOW (ref 32.0–36.0)
MCV: 90 fL (ref 81–101)
MONO#: 0.7 10*3/uL (ref 0.1–0.9)
MONO%: 1.4 % (ref 0.0–13.0)
NEUT%: 6.5 % — ABNORMAL LOW (ref 39.6–80.0)
NEUTROS ABS: 3.3 10*3/uL (ref 1.5–6.5)
PLATELETS: 135 10*3/uL — AB (ref 145–400)
RBC: 4.58 10*6/uL (ref 3.70–5.32)
RDW: 14.8 % (ref 11.1–15.7)
WBC: 51.1 10*3/uL — AB (ref 3.9–10.0)

## 2013-12-31 LAB — CHCC SATELLITE - SMEAR

## 2013-12-31 LAB — TECHNOLOGIST REVIEW CHCC SATELLITE

## 2013-12-31 MED ORDER — INFLUENZA VAC SPLIT QUAD 0.5 ML IM SUSY
0.5000 mL | PREFILLED_SYRINGE | Freq: Once | INTRAMUSCULAR | Status: DC
Start: 2013-12-31 — End: 2013-12-31
  Filled 2013-12-31: qty 0.5

## 2013-12-31 NOTE — Progress Notes (Signed)
Hematology and Oncology Follow Up Visit  LIZZETH MEDER [id number] Jan 22, 1929 80 y.o. 12/31/2013   Principle Diagnosis:  Chronic lymphocytic leukemia-stage A   Current Therapy:    Observation     Interim History:  Ms.  Brucks is back for followup. Saw her 6 months ago. She's had no problems since we last saw her. She's been doing fairly well. She's not had any problems with nausea or vomiting. She's had no fevers. There's been no issues with infections. She's had no cough or shortness of breath. She's had no cardiac difficulties. There's been no leg swelling. She's not noted any palpable lymph glands.  Medications: Current outpatient prescriptions:amLODipine (NORVASC) 5 MG tablet, Take 1 tablet (5 mg total) by mouth daily., Disp: 30 tablet, Rfl: 0;  Ascorbic Acid (VITAMIN C PO), Take 1 tablet by mouth daily., Disp: , Rfl: ;  aspirin 325 MG tablet, Take 325 mg by mouth daily as needed for pain., Disp: , Rfl: ;  Cholecalciferol (VITAMIN D-3 PO), Take 1 capsule by mouth daily., Disp: , Rfl: ;  Coenzyme Q10 (CO Q 10 PO), Take by mouth every morning., Disp: , Rfl:  Cyanocobalamin (VITAMIN B 12 PO), Take by mouth every morning., Disp: , Rfl: ;  losartan (COZAAR) 50 MG tablet, Take 1 tablet (50 mg total) by mouth daily., Disp: 30 tablet, Rfl: 0;  OVER THE COUNTER MEDICATION, Take 1 tablet by mouth daily. Over the counter eye health supplement, Disp: , Rfl: ;  VITAMIN E PO, Take 1 tablet by mouth daily., Disp: , Rfl:   Allergies: No Known Allergies  Past Medical History.  Review of Systems: As above  Physical Exam:  weight is 193 lb (87.544 kg). Her oral temperature is 97.7 F (36.5 C). Her blood pressure is 154/71 and her pulse is 71. Her respiration is 16.   Somewhat obese white female in no obvious distress. Her head exam shows no adenopathy in the neck. There is no ocular or oral lesions. She has no thyroid is palpable. Lungs are clear. Cardiac exam regular in rhythm with no murmurs rubs  or bruits. Abdomen is soft. Good bowel sounds. There is no fluid wave. There is a palpable abdominal mass. There is no palpable liver or spleen tip. Extremities shows no clubbing cyanosis or edema. Just age-related osteoarthritic changes. Exam shows some slight kyphosis. Axillary exam shows no right axillary adenopathy. Under the left axilla, there is a mobile, 1 cm lymph node that is non-tender. Lab Results  Component Value Date   WBC 51.1* 12/31/2013   HGB 12.9 12/31/2013   HCT 41.0 12/31/2013   MCV 90 12/31/2013   PLT 135* 12/31/2013     Chemistry      Component Value Date/Time   NA 135 12/29/2012 2059   NA 142 01/02/2009 0919   K 4.7 12/29/2012 2059   K 4.4 01/02/2009 0919   CL 98 12/29/2012 2059   CL 101 01/02/2009 0919   CO2 27 12/29/2012 2059   CO2 31 01/02/2009 0919   BUN 15 12/29/2012 2059   BUN 21 01/02/2009 0919   CREATININE 0.64 12/29/2012 2059   CREATININE 0.9 01/02/2009 0919      Component Value Date/Time   CALCIUM 9.9 12/29/2012 2059   CALCIUM 10.3 01/02/2009 0919   ALKPHOS 70 01/02/2009 0919   ALKPHOS 78 02/28/2008 1317   AST 16 01/02/2009 0919   AST 13 02/28/2008 1317   ALT 13 01/02/2009 0919   ALT 13 02/28/2008 1317   BILITOT 0.70  01/02/2009 0919   BILITOT 0.5 02/28/2008 1317         Impression and Plan: Ms. Corliss is an 40 -year-old white female with CLL. She has stage A disease. We have been following her for several years. Her white cell count has gradually been trending upward. I noted that her platelet count was down a little bit today.  I think that we can just follow her up in 6 months now. Again she has other comorbid conditions that we have to keep in mind.  I will go ahead and plan to see her back in 6 months.   [name], MD 10/5/201511:28 AM

## 2014-01-07 ENCOUNTER — Other Ambulatory Visit: Payer: Medicare Other | Admitting: Lab

## 2014-01-07 ENCOUNTER — Ambulatory Visit: Payer: Medicare Other | Admitting: Hematology & Oncology

## 2014-05-17 ENCOUNTER — Telehealth: Payer: Self-pay | Admitting: Hematology & Oncology

## 2014-05-17 NOTE — Telephone Encounter (Signed)
Pt moved 4-5 to 4-12

## 2014-07-02 ENCOUNTER — Other Ambulatory Visit: Payer: Medicare Other | Admitting: Lab

## 2014-07-02 ENCOUNTER — Ambulatory Visit: Payer: Medicare Other | Admitting: Hematology & Oncology

## 2014-07-09 ENCOUNTER — Ambulatory Visit (HOSPITAL_BASED_OUTPATIENT_CLINIC_OR_DEPARTMENT_OTHER): Payer: Medicare Other | Admitting: Hematology & Oncology

## 2014-07-09 ENCOUNTER — Encounter: Payer: Self-pay | Admitting: Hematology & Oncology

## 2014-07-09 ENCOUNTER — Other Ambulatory Visit (HOSPITAL_BASED_OUTPATIENT_CLINIC_OR_DEPARTMENT_OTHER): Payer: Medicare Other

## 2014-07-09 ENCOUNTER — Telehealth: Payer: Self-pay | Admitting: *Deleted

## 2014-07-09 VITALS — BP 176/84 | HR 76 | Temp 98.0°F | Resp 16 | Ht 64.0 in | Wt 200.0 lb

## 2014-07-09 DIAGNOSIS — C9110 Chronic lymphocytic leukemia of B-cell type not having achieved remission: Secondary | ICD-10-CM

## 2014-07-09 LAB — CBC WITH DIFFERENTIAL (CANCER CENTER ONLY)
BASO#: 0.1 10*3/uL (ref 0.0–0.2)
BASO%: 0.1 % (ref 0.0–2.0)
EOS%: 0.5 % (ref 0.0–7.0)
Eosinophils Absolute: 0.3 10*3/uL (ref 0.0–0.5)
HCT: 39.9 % (ref 34.8–46.6)
HGB: 12.3 g/dL (ref 11.6–15.9)
LYMPH#: 51.4 10*3/uL — AB (ref 0.9–3.3)
LYMPH%: 92.6 % — ABNORMAL HIGH (ref 14.0–48.0)
MCH: 27.8 pg (ref 26.0–34.0)
MCHC: 30.8 g/dL — AB (ref 32.0–36.0)
MCV: 90 fL (ref 81–101)
MONO#: 0.5 10*3/uL (ref 0.1–0.9)
MONO%: 1 % (ref 0.0–13.0)
NEUT#: 3.2 10*3/uL (ref 1.5–6.5)
NEUT%: 5.8 % — AB (ref 39.6–80.0)
Platelets: 127 10*3/uL — ABNORMAL LOW (ref 145–400)
RBC: 4.43 10*6/uL (ref 3.70–5.32)
RDW: 15.3 % (ref 11.1–15.7)
WBC: 55.5 10*3/uL (ref 3.9–10.0)

## 2014-07-09 LAB — CHCC SATELLITE - SMEAR

## 2014-07-09 LAB — TECHNOLOGIST REVIEW CHCC SATELLITE

## 2014-07-09 NOTE — Telephone Encounter (Signed)
Critical Value WBC 55.5 Notified Dr Marin Olp. No orders at this time.

## 2014-07-09 NOTE — Progress Notes (Signed)
Hematology and Oncology Follow Up Visit  [name] [id number] 1928/09/14 81 y.o. 07/09/2014   Principle Diagnosis:  Chronic lymphocytic leukemia-stage A   Current Therapy:    Observation     Interim History:  [name] is back for followup. We last saw her back in October. Since then, should be doing okay. She and her husband went to [facility] last week.  She's had no problems with infections. She got to the wintertime without any problems.  She's had no rashes.  There's been no change in bowel or bladder habits. No she's not noted any palpable lymph glands.  She's had no cough. She had no chest discomfort. She's had no nausea or vomiting.  She's had no leg swelling.  Overall, her performance status is ECOG 2..  Medications:  Current outpatient prescriptions:  .  amLODipine (NORVASC) 5 MG tablet, Take 1 tablet (5 mg total) by mouth daily., Disp: 30 tablet, Rfl: 0 .  Ascorbic Acid (VITAMIN C PO), Take 1 tablet by mouth daily., Disp: , Rfl:  .  aspirin 325 MG tablet, Take 325 mg by mouth daily as needed for pain., Disp: , Rfl:  .  Cyanocobalamin (VITAMIN B 12 PO), Take by mouth every morning., Disp: , Rfl:  .  losartan (COZAAR) 50 MG tablet, Take 1 tablet (50 mg total) by mouth daily., Disp: 30 tablet, Rfl: 0 .  OVER THE COUNTER MEDICATION, Take 1 tablet by mouth daily. Over the counter eye health supplement, Disp: , Rfl:  .  VITAMIN E PO, Take 1 tablet by mouth daily., Disp: , Rfl:  .  zinc gluconate 50 MG tablet, Take 50 mg by mouth daily., Disp: , Rfl:  .  Cholecalciferol (VITAMIN D-3 PO), Take 1 capsule by mouth daily., Disp: , Rfl:   Allergies: No Known Allergies  Past Medical History.  Review of Systems: As above  Physical Exam:  height is 5\' 4"  (1.626 m) and weight is 200 lb (90.719 kg). Her oral temperature is 98 F (36.7 C). Her blood pressure is 176/84 and her pulse is 76. Her respiration is 16.   Somewhat obese white female in no obvious  distress. Her head exam shows no adenopathy in the neck. There is no ocular or oral lesions. She has no thyroid is palpable. Lungs are clear. Cardiac exam regular in rhythm with no murmurs rubs or bruits. Abdomen is soft. She has good bowel sounds. There is no fluid wave. There is no palpable abdominal mass. There is no palpable liver or spleen tip. Extremities shows no clubbing cyanosis or edema. Just age-related osteoarthritic changes. Exam shows some slight kyphosis. Axillary exam shows no right axillary adenopathy. Under the left axilla, there is a mobile, 1 cm lymph node that is non-tender. This is stable. Lab Results  Component Value Date   WBC 55.5* 07/09/2014   HGB 12.3 07/09/2014   HCT 39.9 07/09/2014   MCV 90 07/09/2014   PLT 127* 07/09/2014     Chemistry      Component Value Date/Time   NA 135 12/29/2012 2059   NA 142 01/02/2009 0919   K 4.7 12/29/2012 2059   K 4.4 01/02/2009 0919   CL 98 12/29/2012 2059   CL 101 01/02/2009 0919   CO2 27 12/29/2012 2059   CO2 31 01/02/2009 0919   BUN 15 12/29/2012 2059   BUN 21 01/02/2009 0919   CREATININE 0.64 12/29/2012 2059   CREATININE 0.9 01/02/2009 0919      Component  Value Date/Time   CALCIUM 9.9 12/29/2012 2059   CALCIUM 10.3 01/02/2009 0919   ALKPHOS 70 01/02/2009 0919   ALKPHOS 78 02/28/2008 1317   AST 16 01/02/2009 0919   AST 13 02/28/2008 1317   ALT 13 01/02/2009 0919   ALT 13 02/28/2008 1317   BILITOT 0.70 01/02/2009 0919   BILITOT 0.5 02/28/2008 1317         Impression and Plan: [name] is an 81 -year-old white female with CLL. She has stage A disease. We have been following her for several years. Her white cell count has gradually been trending upward.   I think that we can just follow her up in 6 months now. Again she has other comorbid conditions that we have to keep in mind.  I will go ahead and plan to see her back in 6 months.   [name], MD 4/12/201610:59 AM

## 2015-01-09 ENCOUNTER — Other Ambulatory Visit (HOSPITAL_BASED_OUTPATIENT_CLINIC_OR_DEPARTMENT_OTHER): Payer: Medicare Other

## 2015-01-09 ENCOUNTER — Encounter: Payer: Self-pay | Admitting: Hematology & Oncology

## 2015-01-09 ENCOUNTER — Ambulatory Visit (HOSPITAL_BASED_OUTPATIENT_CLINIC_OR_DEPARTMENT_OTHER): Payer: Medicare Other | Admitting: Hematology & Oncology

## 2015-01-09 VITALS — BP 159/85 | HR 70 | Temp 97.4°F | Resp 16 | Ht 63.0 in | Wt 198.0 lb

## 2015-01-09 DIAGNOSIS — C9110 Chronic lymphocytic leukemia of B-cell type not having achieved remission: Secondary | ICD-10-CM | POA: Diagnosis not present

## 2015-01-09 LAB — CBC WITH DIFFERENTIAL (CANCER CENTER ONLY)
BASO#: 0 10*3/uL (ref 0.0–0.2)
BASO%: 0.1 % (ref 0.0–2.0)
EOS%: 0.4 % (ref 0.0–7.0)
Eosinophils Absolute: 0.2 10*3/uL (ref 0.0–0.5)
HCT: 40.7 % (ref 34.8–46.6)
HEMOGLOBIN: 12.6 g/dL (ref 11.6–15.9)
LYMPH#: 39 10*3/uL — ABNORMAL HIGH (ref 0.9–3.3)
LYMPH%: 91.8 % — ABNORMAL HIGH (ref 14.0–48.0)
MCH: 27.8 pg (ref 26.0–34.0)
MCHC: 31 g/dL — AB (ref 32.0–36.0)
MCV: 90 fL (ref 81–101)
MONO#: 0.4 10*3/uL (ref 0.1–0.9)
MONO%: 0.9 % (ref 0.0–13.0)
NEUT#: 2.9 10*3/uL (ref 1.5–6.5)
NEUT%: 6.8 % — AB (ref 39.6–80.0)
Platelets: 119 10*3/uL — ABNORMAL LOW (ref 145–400)
RBC: 4.54 10*6/uL (ref 3.70–5.32)
RDW: 15.1 % (ref 11.1–15.7)
WBC: 42.5 10*3/uL — ABNORMAL HIGH (ref 3.9–10.0)

## 2015-01-09 LAB — CHCC SATELLITE - SMEAR

## 2015-01-09 NOTE — Progress Notes (Signed)
Hematology and Oncology Follow Up Visit  BRECKLYN GALVIS [id number] 1928-08-23 81 y.o. 01/09/2015   Principle Diagnosis:  Chronic lymphocytic leukemia-stage A   Current Therapy:    Observation     Interim History:  Ms.  Wollen is back for followup. We last saw her back in April. She had a busy summer. She had company to visit. She ate quite a bit she said.  Otherwise, she's been doing fairly well. She's had no change in medications. She's had no nausea or vomiting. She's not felt any palpable lymph glands.  She's had no issues with fever. She's had no infections.  She's had some occasional leg swelling.  She's had no cough. She had no chest discomfort. She's had no nausea or vomiting.  She's had no leg swelling.  Overall, her performance status is ECOG 2..  Medications:  Current outpatient prescriptions:  .  amLODipine (NORVASC) 5 MG tablet, Take 1 tablet (5 mg total) by mouth daily., Disp: 30 tablet, Rfl: 0 .  Ascorbic Acid (VITAMIN C PO), Take 1 tablet by mouth daily., Disp: , Rfl:  .  aspirin 325 MG tablet, Take 325 mg by mouth daily as needed for pain., Disp: , Rfl:  .  Cholecalciferol (VITAMIN D-3 PO), Take 1 capsule by mouth daily., Disp: , Rfl:  .  Cyanocobalamin (VITAMIN B 12 PO), Take by mouth every morning., Disp: , Rfl:  .  dorzolamide-timolol (COSOPT) 22.3-6.8 MG/ML ophthalmic solution, , Disp: , Rfl:  .  losartan (COZAAR) 50 MG tablet, Take 1 tablet (50 mg total) by mouth daily., Disp: 30 tablet, Rfl: 0 .  OVER THE COUNTER MEDICATION, Take 1 tablet by mouth daily. Over the counter eye health supplement, Disp: , Rfl:  .  VITAMIN E PO, Take 1 tablet by mouth daily., Disp: , Rfl:  .  zinc gluconate 50 MG tablet, Take 50 mg by mouth daily., Disp: , Rfl:   Allergies: No Known Allergies  Past Medical History.  Review of Systems: As above  Physical Exam:  height is 5\' 3"  (1.6 m) and weight is 198 lb (89.812 kg). Her oral temperature is 97.4 F (36.3 C). Her  blood pressure is 159/85 and her pulse is 70. Her respiration is 16.   Somewhat obese white female in no obvious distress. Her head exam shows no adenopathy in the neck. There is no ocular or oral lesions. She has no thyroid is palpable. Lungs are clear. Cardiac exam regular in rhythm with no murmurs rubs or bruits. Abdomen is soft. She has good bowel sounds. There is no fluid wave. There is no palpable abdominal mass. There is no palpable liver or spleen tip. Extremities shows no clubbing cyanosis or edema. Just age-related osteoarthritic changes. Exam shows some slight kyphosis. Axillary exam shows no right axillary adenopathy. Under the left axilla, there is a mobile, 1 cm lymph node that is non-tender. This is stable. Lab Results  Component Value Date   WBC 42.5* 01/09/2015   HGB 12.6 01/09/2015   HCT 40.7 01/09/2015   MCV 90 01/09/2015   PLT 119* 01/09/2015     Chemistry      Component Value Date/Time   NA 135 12/29/2012 2059   NA 142 01/02/2009 0919   K 4.7 12/29/2012 2059   K 4.4 01/02/2009 0919   CL 98 12/29/2012 2059   CL 101 01/02/2009 0919   CO2 27 12/29/2012 2059   CO2 31 01/02/2009 0919   BUN 15 12/29/2012 2059   BUN 21  01/02/2009 0919   CREATININE 0.64 12/29/2012 2059   CREATININE 0.9 01/02/2009 0919      Component Value Date/Time   CALCIUM 9.9 12/29/2012 2059   CALCIUM 10.3 01/02/2009 0919   ALKPHOS 70 01/02/2009 0919   ALKPHOS 78 02/28/2008 1317   AST 16 01/02/2009 0919   AST 13 02/28/2008 1317   ALT 13 01/02/2009 0919   ALT 13 02/28/2008 1317   BILITOT 0.70 01/02/2009 0919   BILITOT 0.5 02/28/2008 1317         Impression and Plan: Ms. Neal is an 81 year old white female with CLL. She has stage A disease. We have been following her for several years. Her white cell count has gradually been trending upward. Today, thankfully, her white cell count is down a little bit. I told she and her husband that this is very natural for CLL.   I think that we can  just follow her up in 6 months now. Again she has other comorbid conditions that we have to keep in mind.     [name], MD 10/13/201611:17 AM

## 2015-04-04 DIAGNOSIS — H353211 Exudative age-related macular degeneration, right eye, with active choroidal neovascularization: Secondary | ICD-10-CM | POA: Diagnosis not present

## 2015-04-04 DIAGNOSIS — H3570 Unspecified separation of retinal layers: Secondary | ICD-10-CM | POA: Diagnosis not present

## 2015-04-04 DIAGNOSIS — H3562 Retinal hemorrhage, left eye: Secondary | ICD-10-CM | POA: Diagnosis not present

## 2015-04-04 DIAGNOSIS — H353133 Nonexudative age-related macular degeneration, bilateral, advanced atrophic without subfoveal involvement: Secondary | ICD-10-CM | POA: Diagnosis not present

## 2015-04-04 DIAGNOSIS — H353221 Exudative age-related macular degeneration, left eye, with active choroidal neovascularization: Secondary | ICD-10-CM | POA: Diagnosis not present

## 2015-04-23 DIAGNOSIS — H353211 Exudative age-related macular degeneration, right eye, with active choroidal neovascularization: Secondary | ICD-10-CM | POA: Diagnosis not present

## 2015-06-02 DIAGNOSIS — H353221 Exudative age-related macular degeneration, left eye, with active choroidal neovascularization: Secondary | ICD-10-CM | POA: Diagnosis not present

## 2015-06-11 DIAGNOSIS — H353211 Exudative age-related macular degeneration, right eye, with active choroidal neovascularization: Secondary | ICD-10-CM | POA: Diagnosis not present

## 2015-06-17 DIAGNOSIS — R509 Fever, unspecified: Secondary | ICD-10-CM | POA: Diagnosis not present

## 2015-07-03 ENCOUNTER — Encounter: Payer: Self-pay | Admitting: Hematology & Oncology

## 2015-07-03 ENCOUNTER — Other Ambulatory Visit (HOSPITAL_BASED_OUTPATIENT_CLINIC_OR_DEPARTMENT_OTHER): Payer: Medicare HMO

## 2015-07-03 ENCOUNTER — Telehealth: Payer: Self-pay | Admitting: *Deleted

## 2015-07-03 ENCOUNTER — Ambulatory Visit (HOSPITAL_BASED_OUTPATIENT_CLINIC_OR_DEPARTMENT_OTHER): Payer: Medicare HMO | Admitting: Family

## 2015-07-03 VITALS — BP 181/76 | HR 71 | Temp 97.3°F | Resp 14 | Ht 63.0 in | Wt 191.0 lb

## 2015-07-03 DIAGNOSIS — C9110 Chronic lymphocytic leukemia of B-cell type not having achieved remission: Secondary | ICD-10-CM

## 2015-07-03 DIAGNOSIS — J0110 Acute frontal sinusitis, unspecified: Secondary | ICD-10-CM

## 2015-07-03 DIAGNOSIS — J019 Acute sinusitis, unspecified: Secondary | ICD-10-CM

## 2015-07-03 LAB — CBC WITH DIFFERENTIAL (CANCER CENTER ONLY)
BASO#: 0.1 10*3/uL (ref 0.0–0.2)
BASO%: 0.1 % (ref 0.0–2.0)
EOS ABS: 0.2 10*3/uL (ref 0.0–0.5)
EOS%: 0.4 % (ref 0.0–7.0)
HCT: 41.4 % (ref 34.8–46.6)
HGB: 12.9 g/dL (ref 11.6–15.9)
LYMPH#: 46.2 10*3/uL — ABNORMAL HIGH (ref 0.9–3.3)
LYMPH%: 90.6 % — AB (ref 14.0–48.0)
MCH: 27.1 pg (ref 26.0–34.0)
MCHC: 31.2 g/dL — AB (ref 32.0–36.0)
MCV: 87 fL (ref 81–101)
MONO#: 0.4 10*3/uL (ref 0.1–0.9)
MONO%: 0.8 % (ref 0.0–13.0)
NEUT%: 8.1 % — AB (ref 39.6–80.0)
NEUTROS ABS: 4.1 10*3/uL (ref 1.5–6.5)
PLATELETS: 132 10*3/uL — AB (ref 145–400)
RBC: 4.76 10*6/uL (ref 3.70–5.32)
RDW: 15 % (ref 11.1–15.7)
WBC: 51 10*3/uL — AB (ref 3.9–10.0)

## 2015-07-03 LAB — COMPREHENSIVE METABOLIC PANEL
ALT: 10 U/L (ref 0–55)
ANION GAP: 4 meq/L (ref 3–11)
AST: 15 U/L (ref 5–34)
Albumin: 3.8 g/dL (ref 3.5–5.0)
Alkaline Phosphatase: 84 U/L (ref 40–150)
BUN: 8.9 mg/dL (ref 7.0–26.0)
CALCIUM: 9.8 mg/dL (ref 8.4–10.4)
CHLORIDE: 106 meq/L (ref 98–109)
CO2: 30 mEq/L — ABNORMAL HIGH (ref 22–29)
CREATININE: 0.7 mg/dL (ref 0.6–1.1)
EGFR: 74 mL/min/{1.73_m2} — AB (ref >90)
Glucose: 120 mg/dl (ref 70–140)
POTASSIUM: 4.9 meq/L (ref 3.5–5.1)
Sodium: 141 mEq/L (ref 136–145)
Total Bilirubin: 0.53 mg/dL (ref 0.20–1.20)
Total Protein: 6.1 g/dL — ABNORMAL LOW (ref 6.4–8.3)

## 2015-07-03 LAB — TECHNOLOGIST REVIEW CHCC SATELLITE

## 2015-07-03 LAB — CHCC SATELLITE - SMEAR

## 2015-07-03 MED ORDER — AMOXICILLIN-POT CLAVULANATE 875-125 MG PO TABS
1.0000 | ORAL_TABLET | Freq: Two times a day (BID) | ORAL | Status: DC
Start: 2015-07-03 — End: 2016-07-02

## 2015-07-03 NOTE — Telephone Encounter (Signed)
Critical Value WBC 51.0 Dr Marin Olp notified. No orders at this time.

## 2015-07-03 NOTE — Progress Notes (Signed)
Hematology and Oncology Follow Up Visit  [name] [id number] Mar 10, 1929 82 y.o. 07/03/2015   Principle Diagnosis:  Chronic lymphocytic leukemia - stage A   Current Therapy:   Observation    Interim History:  [name] is here today for a follow-up. She is getting over a sinus infection and still has some congestion and a "stuffy nose." This only improved slightly with a z-pack.  Her WBC count is 51.0. She denies frequent infections. No fever, chills, n/v, cough, rash, dizziness, SOB, chest pain, palpitations, abdominal pain or changes in bowel or bladder habits.  No swelling or tenderness in her extremities. She has some mild tingling in her thumb, pointer and middle fingers that comes and goes.  No lymphadenopathy found on exam. No episodes of bleeding or bruising.  She has a good appetite and is staying well hydrated. Her weight is down 7 lbs since her last visit 6 months ago.  She is excited for a trip to the beach with her husband soon.  Medications:    Medication List       This list is accurate as of: 07/03/15 12:01 PM.  Always use your most recent med list.               amLODipine 5 MG tablet  Commonly known as:  NORVASC  Take 1 tablet (5 mg total) by mouth daily.     aspirin 325 MG tablet  Take 325 mg by mouth daily as needed for pain.     dorzolamide-timolol 22.3-6.8 MG/ML ophthalmic solution  Commonly known as:  COSOPT     losartan 50 MG tablet  Commonly known as:  COZAAR  Take 1 tablet (50 mg total) by mouth daily.     OVER THE COUNTER MEDICATION  Take 1 tablet by mouth daily. Over the counter eye health supplement     VITAMIN B 12 PO  Take by mouth every morning.     VITAMIN C PO  Take 1 tablet by mouth daily.     VITAMIN D-3 PO  Take 1 capsule by mouth daily.     VITAMIN E PO  Take 1 tablet by mouth daily.     zinc gluconate 50 MG tablet  Take 50 mg by mouth daily.        Allergies: No Known Allergies  Past Medical History,  Surgical history, Social history, and Family History were reviewed and updated.  Review of Systems: All other 10 point review of systems is negative.   Physical Exam:  height is 5\' 3"  (1.6 m) and weight is 191 lb (86.637 kg). Her oral temperature is 97.3 F (36.3 C). Her blood pressure is 181/76 and her pulse is 71. Her respiration is 14.   Wt Readings from Last 3 Encounters:  07/03/15 191 lb (86.637 kg)  01/09/15 198 lb (89.812 kg)  07/09/14 200 lb (90.719 kg)    Ocular: Sclerae unicteric, pupils equal, round and reactive to light Ear-nose-throat: Oropharynx clear, dentition fair Lymphatic: No cervical supraclavicular or axillary adenopathy Lungs no rales or rhonchi, good excursion bilaterally Heart regular rate and rhythm, no murmur appreciated Abd soft, nontender, positive bowel sounds, no liver or spleen tip palpated on exam, no fluid wave MSK no focal spinal tenderness, slight kyphosis, age-related osteoarthritic changes  Neuro: non-focal, well-oriented, appropriate affect Breasts: Deferred  Lab Results  Component Value Date   WBC 51.0* 07/03/2015   HGB 12.9 07/03/2015   HCT 41.4 07/03/2015   MCV 87 07/03/2015  PLT 132* 07/03/2015   No results found for: FERRITIN, IRON, TIBC, UIBC, IRONPCTSAT Lab Results  Component Value Date   RETICCTPCT 0.8 02/28/2008   RBC 4.76 07/03/2015   RETICCTABS 36.9 02/28/2008   No results found for: [facility] Lab Results  Component Value Date   IGGSERUM 878 02/28/2008   IGA 79 02/28/2008   IGMSERUM 91 02/28/2008   Lab Results  Component Value Date   TOTALPROTELP 6.3 02/28/2008   ALBUMINELP 64.2 02/28/2008   A1GS 4.3 02/28/2008   A2GS 8.7 02/28/2008   BETS 5.9 02/28/2008   BETA2SER 4.1 02/28/2008   GAMS 12.8 02/28/2008   MSPIKE NOT DET 02/28/2008   SPEI * 02/28/2008     Chemistry      Component Value Date/Time   NA 135 12/29/2012 2059   NA 142 01/02/2009 0919   K 4.7 12/29/2012 2059   K 4.4  01/02/2009 0919   CL 98 12/29/2012 2059   CL 101 01/02/2009 0919   CO2 27 12/29/2012 2059   CO2 31 01/02/2009 0919   BUN 15 12/29/2012 2059   BUN 21 01/02/2009 0919   CREATININE 0.64 12/29/2012 2059   CREATININE 0.9 01/02/2009 0919      Component Value Date/Time   CALCIUM 9.9 12/29/2012 2059   CALCIUM 10.3 01/02/2009 0919   ALKPHOS 70 01/02/2009 0919   ALKPHOS 78 02/28/2008 1317   AST 16 01/02/2009 0919   AST 13 02/28/2008 1317   ALT 13 01/02/2009 0919   ALT 13 02/28/2008 1317   BILITOT 0.70 01/02/2009 0919   BILITOT 0.5 02/28/2008 1317     Impression and Plan: [name] is an 82 yo white female with CLL - stage A disease. So far this has not been a real problem for her. Her WBC count remains elevated at 51.0. She denies frequent infections but does currently have a sinus infection. We will have her take Augmentin 875 mg PO BID for 5 days and see if this helps clear things up.  We will continue to follow along with her and plan to see her back in 6 months for follow-up.  She will contact us with any questions or concerns. We can certainly see her sooner if need be.   Eliezer Bottom, NP 4/6/201712:01 PM

## 2015-07-04 DIAGNOSIS — H6121 Impacted cerumen, right ear: Secondary | ICD-10-CM | POA: Diagnosis not present

## 2015-07-10 ENCOUNTER — Ambulatory Visit: Payer: Medicare Other | Admitting: Hematology & Oncology

## 2015-07-10 ENCOUNTER — Other Ambulatory Visit: Payer: Medicare Other

## 2015-07-16 DIAGNOSIS — H6061 Unspecified chronic otitis externa, right ear: Secondary | ICD-10-CM | POA: Diagnosis not present

## 2015-07-16 DIAGNOSIS — H6121 Impacted cerumen, right ear: Secondary | ICD-10-CM | POA: Diagnosis not present

## 2015-07-28 DIAGNOSIS — H353211 Exudative age-related macular degeneration, right eye, with active choroidal neovascularization: Secondary | ICD-10-CM | POA: Diagnosis not present

## 2015-07-28 DIAGNOSIS — H353133 Nonexudative age-related macular degeneration, bilateral, advanced atrophic without subfoveal involvement: Secondary | ICD-10-CM | POA: Diagnosis not present

## 2015-07-28 DIAGNOSIS — H353221 Exudative age-related macular degeneration, left eye, with active choroidal neovascularization: Secondary | ICD-10-CM | POA: Diagnosis not present

## 2015-07-30 DIAGNOSIS — H353211 Exudative age-related macular degeneration, right eye, with active choroidal neovascularization: Secondary | ICD-10-CM | POA: Diagnosis not present

## 2015-09-17 DIAGNOSIS — H353211 Exudative age-related macular degeneration, right eye, with active choroidal neovascularization: Secondary | ICD-10-CM | POA: Diagnosis not present

## 2015-10-06 DIAGNOSIS — H353221 Exudative age-related macular degeneration, left eye, with active choroidal neovascularization: Secondary | ICD-10-CM | POA: Diagnosis not present

## 2015-10-17 DIAGNOSIS — H26492 Other secondary cataract, left eye: Secondary | ICD-10-CM | POA: Diagnosis not present

## 2015-10-17 DIAGNOSIS — Z961 Presence of intraocular lens: Secondary | ICD-10-CM | POA: Diagnosis not present

## 2015-10-17 DIAGNOSIS — H353231 Exudative age-related macular degeneration, bilateral, with active choroidal neovascularization: Secondary | ICD-10-CM | POA: Diagnosis not present

## 2015-10-17 DIAGNOSIS — H40053 Ocular hypertension, bilateral: Secondary | ICD-10-CM | POA: Diagnosis not present

## 2015-10-21 DIAGNOSIS — J0100 Acute maxillary sinusitis, unspecified: Secondary | ICD-10-CM | POA: Diagnosis not present

## 2015-10-28 DIAGNOSIS — R7309 Other abnormal glucose: Secondary | ICD-10-CM | POA: Diagnosis not present

## 2015-10-28 DIAGNOSIS — I10 Essential (primary) hypertension: Secondary | ICD-10-CM | POA: Diagnosis not present

## 2015-10-28 DIAGNOSIS — C9110 Chronic lymphocytic leukemia of B-cell type not having achieved remission: Secondary | ICD-10-CM | POA: Diagnosis not present

## 2015-11-05 DIAGNOSIS — H353221 Exudative age-related macular degeneration, left eye, with active choroidal neovascularization: Secondary | ICD-10-CM | POA: Diagnosis not present

## 2015-11-05 DIAGNOSIS — H353211 Exudative age-related macular degeneration, right eye, with active choroidal neovascularization: Secondary | ICD-10-CM | POA: Diagnosis not present

## 2015-12-23 DIAGNOSIS — Z961 Presence of intraocular lens: Secondary | ICD-10-CM | POA: Diagnosis not present

## 2015-12-23 DIAGNOSIS — H353221 Exudative age-related macular degeneration, left eye, with active choroidal neovascularization: Secondary | ICD-10-CM | POA: Diagnosis not present

## 2015-12-23 DIAGNOSIS — H401113 Primary open-angle glaucoma, right eye, severe stage: Secondary | ICD-10-CM | POA: Diagnosis not present

## 2015-12-23 DIAGNOSIS — H40012 Open angle with borderline findings, low risk, left eye: Secondary | ICD-10-CM | POA: Diagnosis not present

## 2015-12-24 DIAGNOSIS — H401113 Primary open-angle glaucoma, right eye, severe stage: Secondary | ICD-10-CM | POA: Diagnosis not present

## 2015-12-24 DIAGNOSIS — H40012 Open angle with borderline findings, low risk, left eye: Secondary | ICD-10-CM | POA: Diagnosis not present

## 2015-12-31 DIAGNOSIS — H401113 Primary open-angle glaucoma, right eye, severe stage: Secondary | ICD-10-CM | POA: Diagnosis not present

## 2015-12-31 DIAGNOSIS — H40012 Open angle with borderline findings, low risk, left eye: Secondary | ICD-10-CM | POA: Diagnosis not present

## 2016-01-02 ENCOUNTER — Other Ambulatory Visit (HOSPITAL_BASED_OUTPATIENT_CLINIC_OR_DEPARTMENT_OTHER): Payer: Medicare HMO

## 2016-01-02 ENCOUNTER — Ambulatory Visit (HOSPITAL_BASED_OUTPATIENT_CLINIC_OR_DEPARTMENT_OTHER): Payer: Medicare HMO | Admitting: Hematology & Oncology

## 2016-01-02 ENCOUNTER — Telehealth: Payer: Self-pay | Admitting: *Deleted

## 2016-01-02 VITALS — BP 140/61 | HR 58 | Temp 97.7°F | Resp 18 | Wt 181.8 lb

## 2016-01-02 DIAGNOSIS — C9110 Chronic lymphocytic leukemia of B-cell type not having achieved remission: Secondary | ICD-10-CM

## 2016-01-02 DIAGNOSIS — J0110 Acute frontal sinusitis, unspecified: Secondary | ICD-10-CM

## 2016-01-02 LAB — COMPREHENSIVE METABOLIC PANEL
ALBUMIN: 3.7 g/dL (ref 3.5–5.0)
ALK PHOS: 88 U/L (ref 40–150)
ALT: 12 U/L (ref 0–55)
ANION GAP: 6 meq/L (ref 3–11)
AST: 12 U/L (ref 5–34)
BILIRUBIN TOTAL: 0.42 mg/dL (ref 0.20–1.20)
BUN: 19.9 mg/dL (ref 7.0–26.0)
CO2: 23 mEq/L (ref 22–29)
Calcium: 9.6 mg/dL (ref 8.4–10.4)
Chloride: 110 mEq/L — ABNORMAL HIGH (ref 98–109)
Creatinine: 0.8 mg/dL (ref 0.6–1.1)
EGFR: 70 mL/min/{1.73_m2} — AB (ref >90)
GLUCOSE: 127 mg/dL (ref 70–140)
POTASSIUM: 4 meq/L (ref 3.5–5.1)
SODIUM: 139 meq/L (ref 136–145)
TOTAL PROTEIN: 5.9 g/dL — AB (ref 6.4–8.3)

## 2016-01-02 LAB — CBC WITH DIFFERENTIAL (CANCER CENTER ONLY)
BASO#: 0 10*3/uL (ref 0.0–0.2)
BASO%: 0.1 % (ref 0.0–2.0)
EOS ABS: 1 10*3/uL — AB (ref 0.0–0.5)
EOS%: 2.1 % (ref 0.0–7.0)
HEMATOCRIT: 41.2 % (ref 34.8–46.6)
HGB: 12.6 g/dL (ref 11.6–15.9)
LYMPH#: 43.4 10*3/uL — AB (ref 0.9–3.3)
LYMPH%: 90.6 % — ABNORMAL HIGH (ref 14.0–48.0)
MCH: 27.5 pg (ref 26.0–34.0)
MCHC: 30.6 g/dL — AB (ref 32.0–36.0)
MCV: 90 fL (ref 81–101)
MONO#: 0.5 10*3/uL (ref 0.1–0.9)
MONO%: 1.1 % (ref 0.0–13.0)
NEUT#: 3 10*3/uL (ref 1.5–6.5)
NEUT%: 6.1 % — AB (ref 39.6–80.0)
Platelets: 124 10*3/uL — ABNORMAL LOW (ref 145–400)
RBC: 4.59 10*6/uL (ref 3.70–5.32)
RDW: 15.6 % (ref 11.1–15.7)
WBC: 47.9 10*3/uL — ABNORMAL HIGH (ref 3.9–10.0)

## 2016-01-02 LAB — TECHNOLOGIST REVIEW CHCC SATELLITE

## 2016-01-02 LAB — CHCC SATELLITE - SMEAR

## 2016-01-02 NOTE — Progress Notes (Signed)
Hematology and Oncology Follow Up Visit  [name] [id number] 1928-08-10 82 y.o. 01/02/2016   Principle Diagnosis:  Chronic lymphocytic leukemia-stage A   Current Therapy:    Observation     Interim History:  [name] is back for followup. We last saw her back in April. She had a busy summer. She had company to visit. She ate quite a bit she said.  Otherwise, she's been doing fairly well. She's had no change in medications. She's had no nausea or vomiting. She's not felt any palpable lymph glands.  She is having some problems with her eyes. She was having some macular degeneration. Now, she has some glaucoma.  She's had no issues with fever. She's had no infections.  She's had some occasional leg swelling.  She's had no cough. She had no chest discomfort. She's had no nausea or vomiting.  She's had no leg swelling.  Overall, her performance status is ECOG 2..  Medications:  Current Outpatient Prescriptions:  .  amLODipine (NORVASC) 5 MG tablet, Take 1 tablet (5 mg total) by mouth daily., Disp: 30 tablet, Rfl: 0 .  amoxicillin-clavulanate (AUGMENTIN) 875-125 MG tablet, Take 1 tablet by mouth 2 (two) times daily., Disp: 10 tablet, Rfl: 0 .  Ascorbic Acid (VITAMIN C PO), Take 1 tablet by mouth daily., Disp: , Rfl:  .  aspirin 325 MG tablet, Take 325 mg by mouth daily as needed for pain., Disp: , Rfl:  .  Cholecalciferol (VITAMIN D-3 PO), Take 1 capsule by mouth daily., Disp: , Rfl:  .  Cyanocobalamin (VITAMIN B 12 PO), Take by mouth every morning., Disp: , Rfl:  .  dorzolamide-timolol (COSOPT) 22.3-6.8 MG/ML ophthalmic solution, , Disp: , Rfl:  .  losartan (COZAAR) 50 MG tablet, Take 1 tablet (50 mg total) by mouth daily., Disp: 30 tablet, Rfl: 0 .  OVER THE COUNTER MEDICATION, Take 1 tablet by mouth daily. Over the counter eye health supplement, Disp: , Rfl:  .  VITAMIN E PO, Take 1 tablet by mouth daily., Disp: , Rfl:  .  zinc gluconate 50 MG tablet, Take 50 mg by  mouth daily., Disp: , Rfl:   Allergies: No Known Allergies  Past Medical History.  Review of Systems: As above  Physical Exam:  weight is 181 lb 12.8 oz (82.5 kg). Her oral temperature is 97.7 F (36.5 C). Her blood pressure is 140/61 and her pulse is 58 (abnormal). Her respiration is 18.   Somewhat obese white female in no obvious distress. Her head exam shows no adenopathy in the neck. There is no ocular or oral lesions. She has no thyroid is palpable. Lungs are clear. Cardiac exam regular in rhythm with no murmurs rubs or bruits. Abdomen is soft. She has good bowel sounds. There is no fluid wave. There is no palpable abdominal mass. There is no palpable liver or spleen tip. Extremities shows no clubbing cyanosis or edema. Just age-related osteoarthritic changes. Exam shows some slight kyphosis. Axillary exam shows no right axillary adenopathy. Under the left axilla, there is a mobile, 1 cm lymph node that is non-tender. This is stable. Lab Results  Component Value Date   WBC 47.9 (H) 01/02/2016   HGB 12.6 01/02/2016   HCT 41.2 01/02/2016   MCV 90 01/02/2016   PLT 124 (L) 01/02/2016     Chemistry      Component Value Date/Time   NA 141 07/03/2015 1130   K 4.9 07/03/2015 1130   CL 98 12/29/2012 2059  CL 101 01/02/2009 0919   CO2 30 (H) 07/03/2015 1130   BUN 8.9 07/03/2015 1130   CREATININE 0.7 07/03/2015 1130      Component Value Date/Time   CALCIUM 9.8 07/03/2015 1130   ALKPHOS 84 07/03/2015 1130   AST 15 07/03/2015 1130   ALT 10 07/03/2015 1130   BILITOT 0.53 07/03/2015 1130         Impression and Plan: [name] is an 82 year old white female with CLL. She has stage A disease. We have been following her for about 8 years. Her white cell count has gradually been trending upward. Today, thankfully, her white cell count is down a little bit. I told she and her husband that this is very natural for CLL.   I think that we can just follow her up in 6 months now.  Again she has other comorbid conditions that we have to keep in mind.     [name], MD 10/6/201712:42 PM

## 2016-01-02 NOTE — Telephone Encounter (Signed)
Pt called to confirm her appts today.  Informed her of appt times today.  She verbalized understanding.

## 2016-01-05 DIAGNOSIS — H353211 Exudative age-related macular degeneration, right eye, with active choroidal neovascularization: Secondary | ICD-10-CM | POA: Diagnosis not present

## 2016-01-09 DIAGNOSIS — Z23 Encounter for immunization: Secondary | ICD-10-CM | POA: Diagnosis not present

## 2016-01-27 DIAGNOSIS — H40012 Open angle with borderline findings, low risk, left eye: Secondary | ICD-10-CM | POA: Diagnosis not present

## 2016-01-27 DIAGNOSIS — H401113 Primary open-angle glaucoma, right eye, severe stage: Secondary | ICD-10-CM | POA: Diagnosis not present

## 2016-02-12 DIAGNOSIS — H40012 Open angle with borderline findings, low risk, left eye: Secondary | ICD-10-CM | POA: Diagnosis not present

## 2016-02-12 DIAGNOSIS — H401113 Primary open-angle glaucoma, right eye, severe stage: Secondary | ICD-10-CM | POA: Diagnosis not present

## 2016-02-16 DIAGNOSIS — H353211 Exudative age-related macular degeneration, right eye, with active choroidal neovascularization: Secondary | ICD-10-CM | POA: Diagnosis not present

## 2016-03-24 DIAGNOSIS — H353211 Exudative age-related macular degeneration, right eye, with active choroidal neovascularization: Secondary | ICD-10-CM | POA: Diagnosis not present

## 2016-03-24 DIAGNOSIS — D72829 Elevated white blood cell count, unspecified: Secondary | ICD-10-CM | POA: Diagnosis not present

## 2016-03-24 DIAGNOSIS — M7989 Other specified soft tissue disorders: Secondary | ICD-10-CM | POA: Diagnosis not present

## 2016-03-24 DIAGNOSIS — H353221 Exudative age-related macular degeneration, left eye, with active choroidal neovascularization: Secondary | ICD-10-CM | POA: Diagnosis not present

## 2016-03-24 DIAGNOSIS — Z79899 Other long term (current) drug therapy: Secondary | ICD-10-CM | POA: Diagnosis not present

## 2016-03-24 DIAGNOSIS — H353133 Nonexudative age-related macular degeneration, bilateral, advanced atrophic without subfoveal involvement: Secondary | ICD-10-CM | POA: Diagnosis not present

## 2016-03-25 DIAGNOSIS — D72829 Elevated white blood cell count, unspecified: Secondary | ICD-10-CM | POA: Diagnosis not present

## 2016-03-25 DIAGNOSIS — M7989 Other specified soft tissue disorders: Secondary | ICD-10-CM | POA: Diagnosis not present

## 2016-03-30 DIAGNOSIS — H353211 Exudative age-related macular degeneration, right eye, with active choroidal neovascularization: Secondary | ICD-10-CM | POA: Diagnosis not present

## 2016-04-01 DIAGNOSIS — I10 Essential (primary) hypertension: Secondary | ICD-10-CM | POA: Diagnosis not present

## 2016-04-01 DIAGNOSIS — D72829 Elevated white blood cell count, unspecified: Secondary | ICD-10-CM | POA: Diagnosis not present

## 2016-04-01 DIAGNOSIS — M7989 Other specified soft tissue disorders: Secondary | ICD-10-CM | POA: Diagnosis not present

## 2016-04-01 DIAGNOSIS — C9110 Chronic lymphocytic leukemia of B-cell type not having achieved remission: Secondary | ICD-10-CM | POA: Diagnosis not present

## 2016-04-05 DIAGNOSIS — L0390 Cellulitis, unspecified: Secondary | ICD-10-CM | POA: Diagnosis not present

## 2016-04-05 DIAGNOSIS — M7989 Other specified soft tissue disorders: Secondary | ICD-10-CM | POA: Diagnosis not present

## 2016-04-05 DIAGNOSIS — R42 Dizziness and giddiness: Secondary | ICD-10-CM | POA: Diagnosis not present

## 2016-04-06 ENCOUNTER — Other Ambulatory Visit: Payer: Self-pay | Admitting: Family Medicine

## 2016-04-06 DIAGNOSIS — R42 Dizziness and giddiness: Secondary | ICD-10-CM

## 2016-04-15 DIAGNOSIS — Z933 Colostomy status: Secondary | ICD-10-CM | POA: Diagnosis not present

## 2016-04-15 DIAGNOSIS — Z9889 Other specified postprocedural states: Secondary | ICD-10-CM | POA: Diagnosis not present

## 2016-04-15 DIAGNOSIS — Z85048 Personal history of other malignant neoplasm of rectum, rectosigmoid junction, and anus: Secondary | ICD-10-CM | POA: Diagnosis not present

## 2016-04-15 DIAGNOSIS — Z980 Intestinal bypass and anastomosis status: Secondary | ICD-10-CM | POA: Diagnosis not present

## 2016-04-15 DIAGNOSIS — K5939 Other megacolon: Secondary | ICD-10-CM | POA: Diagnosis not present

## 2016-04-15 DIAGNOSIS — Z86010 Personal history of colonic polyps: Secondary | ICD-10-CM | POA: Diagnosis not present

## 2016-04-15 DIAGNOSIS — K648 Other hemorrhoids: Secondary | ICD-10-CM | POA: Diagnosis not present

## 2016-04-15 DIAGNOSIS — D124 Benign neoplasm of descending colon: Secondary | ICD-10-CM | POA: Diagnosis not present

## 2016-04-15 DIAGNOSIS — K635 Polyp of colon: Secondary | ICD-10-CM | POA: Diagnosis not present

## 2016-04-15 DIAGNOSIS — D125 Benign neoplasm of sigmoid colon: Secondary | ICD-10-CM | POA: Diagnosis not present

## 2016-04-20 ENCOUNTER — Ambulatory Visit
Admission: RE | Admit: 2016-04-20 | Discharge: 2016-04-20 | Disposition: A | Discharge status: Home / Self Care | Payer: Medicare HMO | Source: Ambulatory Visit | Attending: Family Medicine | Admitting: Family Medicine

## 2016-04-20 DIAGNOSIS — R42 Dizziness and giddiness: Secondary | ICD-10-CM

## 2016-04-20 DIAGNOSIS — I639 Cerebral infarction, unspecified: Secondary | ICD-10-CM | POA: Diagnosis not present

## 2016-04-29 DIAGNOSIS — R7303 Prediabetes: Secondary | ICD-10-CM | POA: Diagnosis not present

## 2016-04-29 DIAGNOSIS — Z471 Aftercare following joint replacement surgery: Secondary | ICD-10-CM | POA: Diagnosis not present

## 2016-04-29 DIAGNOSIS — C9110 Chronic lymphocytic leukemia of B-cell type not having achieved remission: Secondary | ICD-10-CM | POA: Diagnosis not present

## 2016-04-29 DIAGNOSIS — N390 Urinary tract infection, site not specified: Secondary | ICD-10-CM | POA: Diagnosis not present

## 2016-04-29 DIAGNOSIS — Z96652 Presence of left artificial knee joint: Secondary | ICD-10-CM | POA: Diagnosis not present

## 2016-04-29 DIAGNOSIS — S8012XA Contusion of left lower leg, initial encounter: Secondary | ICD-10-CM | POA: Diagnosis not present

## 2016-04-29 DIAGNOSIS — R600 Localized edema: Secondary | ICD-10-CM | POA: Diagnosis not present

## 2016-04-29 DIAGNOSIS — I10 Essential (primary) hypertension: Secondary | ICD-10-CM | POA: Diagnosis not present

## 2016-05-11 DIAGNOSIS — R21 Rash and other nonspecific skin eruption: Secondary | ICD-10-CM | POA: Diagnosis not present

## 2016-05-13 DIAGNOSIS — Z961 Presence of intraocular lens: Secondary | ICD-10-CM | POA: Diagnosis not present

## 2016-05-13 DIAGNOSIS — H401113 Primary open-angle glaucoma, right eye, severe stage: Secondary | ICD-10-CM | POA: Diagnosis not present

## 2016-05-13 DIAGNOSIS — H40012 Open angle with borderline findings, low risk, left eye: Secondary | ICD-10-CM | POA: Diagnosis not present

## 2016-05-17 DIAGNOSIS — M13142 Monoarthritis, not elsewhere classified, left hand: Secondary | ICD-10-CM | POA: Diagnosis not present

## 2016-05-17 DIAGNOSIS — M13141 Monoarthritis, not elsewhere classified, right hand: Secondary | ICD-10-CM | POA: Diagnosis not present

## 2016-05-17 DIAGNOSIS — H409 Unspecified glaucoma: Secondary | ICD-10-CM | POA: Diagnosis not present

## 2016-05-17 DIAGNOSIS — K5900 Constipation, unspecified: Secondary | ICD-10-CM | POA: Diagnosis not present

## 2016-05-17 DIAGNOSIS — Z791 Long term (current) use of non-steroidal anti-inflammatories (NSAID): Secondary | ICD-10-CM | POA: Diagnosis not present

## 2016-05-17 DIAGNOSIS — I10 Essential (primary) hypertension: Secondary | ICD-10-CM | POA: Diagnosis not present

## 2016-05-17 DIAGNOSIS — Z9181 History of falling: Secondary | ICD-10-CM | POA: Diagnosis not present

## 2016-05-17 DIAGNOSIS — Z972 Presence of dental prosthetic device (complete) (partial): Secondary | ICD-10-CM | POA: Diagnosis not present

## 2016-05-17 DIAGNOSIS — Z0000 Encounter for general adult medical examination without abnormal findings: Secondary | ICD-10-CM | POA: Diagnosis not present

## 2016-05-17 DIAGNOSIS — Z6833 Body mass index (BMI) 33.0-33.9, adult: Secondary | ICD-10-CM | POA: Diagnosis not present

## 2016-05-17 DIAGNOSIS — M25511 Pain in right shoulder: Secondary | ICD-10-CM | POA: Diagnosis not present

## 2016-05-17 DIAGNOSIS — C9110 Chronic lymphocytic leukemia of B-cell type not having achieved remission: Secondary | ICD-10-CM | POA: Diagnosis not present

## 2016-05-17 DIAGNOSIS — H3530 Unspecified macular degeneration: Secondary | ICD-10-CM | POA: Diagnosis not present

## 2016-05-17 DIAGNOSIS — E669 Obesity, unspecified: Secondary | ICD-10-CM | POA: Diagnosis not present

## 2016-05-18 DIAGNOSIS — H353211 Exudative age-related macular degeneration, right eye, with active choroidal neovascularization: Secondary | ICD-10-CM | POA: Diagnosis not present

## 2016-06-10 DIAGNOSIS — Z96652 Presence of left artificial knee joint: Secondary | ICD-10-CM | POA: Diagnosis not present

## 2016-06-10 DIAGNOSIS — Z471 Aftercare following joint replacement surgery: Secondary | ICD-10-CM | POA: Diagnosis not present

## 2016-06-23 DIAGNOSIS — H401131 Primary open-angle glaucoma, bilateral, mild stage: Secondary | ICD-10-CM | POA: Diagnosis not present

## 2016-06-23 DIAGNOSIS — Z961 Presence of intraocular lens: Secondary | ICD-10-CM | POA: Diagnosis not present

## 2016-06-23 DIAGNOSIS — H40012 Open angle with borderline findings, low risk, left eye: Secondary | ICD-10-CM | POA: Diagnosis not present

## 2016-06-23 DIAGNOSIS — H401113 Primary open-angle glaucoma, right eye, severe stage: Secondary | ICD-10-CM | POA: Diagnosis not present

## 2016-06-23 DIAGNOSIS — H353221 Exudative age-related macular degeneration, left eye, with active choroidal neovascularization: Secondary | ICD-10-CM | POA: Diagnosis not present

## 2016-06-30 DIAGNOSIS — H40012 Open angle with borderline findings, low risk, left eye: Secondary | ICD-10-CM | POA: Diagnosis not present

## 2016-06-30 DIAGNOSIS — H401113 Primary open-angle glaucoma, right eye, severe stage: Secondary | ICD-10-CM | POA: Diagnosis not present

## 2016-07-02 ENCOUNTER — Ambulatory Visit (HOSPITAL_BASED_OUTPATIENT_CLINIC_OR_DEPARTMENT_OTHER): Payer: Medicare HMO | Admitting: Hematology & Oncology

## 2016-07-02 ENCOUNTER — Other Ambulatory Visit (HOSPITAL_BASED_OUTPATIENT_CLINIC_OR_DEPARTMENT_OTHER): Payer: Medicare HMO

## 2016-07-02 VITALS — BP 155/72 | HR 57 | Temp 97.7°F | Resp 19 | Wt 185.0 lb

## 2016-07-02 DIAGNOSIS — C9110 Chronic lymphocytic leukemia of B-cell type not having achieved remission: Secondary | ICD-10-CM | POA: Diagnosis not present

## 2016-07-02 LAB — CBC WITH DIFFERENTIAL (CANCER CENTER ONLY)
BASO#: 0.4 10*3/uL — AB (ref 0.0–0.2)
BASO%: 0.5 % (ref 0.0–2.0)
EOS%: 0.3 % (ref 0.0–7.0)
Eosinophils Absolute: 0.3 10*3/uL (ref 0.0–0.5)
HEMATOCRIT: 30 % — AB (ref 34.8–46.6)
HEMOGLOBIN: 9.4 g/dL — AB (ref 11.6–15.9)
LYMPH#: 73.7 10*3/uL — AB (ref 0.9–3.3)
LYMPH%: 93.6 % — ABNORMAL HIGH (ref 14.0–48.0)
MCH: 29.5 pg (ref 26.0–34.0)
MCHC: 31.3 g/dL — AB (ref 32.0–36.0)
MCV: 94 fL (ref 81–101)
MONO#: 0.5 10*3/uL (ref 0.1–0.9)
MONO%: 0.6 % (ref 0.0–13.0)
NEUT%: 5 % — ABNORMAL LOW (ref 39.6–80.0)
NEUTROS ABS: 3.9 10*3/uL (ref 1.5–6.5)
Platelets: 147 10*3/uL (ref 145–400)
RBC: 3.19 10*6/uL — AB (ref 3.70–5.32)
RDW: 23.5 % — AB (ref 11.1–15.7)
WBC: 78.8 10*3/uL — AB (ref 3.9–10.0)

## 2016-07-02 LAB — CHCC SATELLITE - SMEAR

## 2016-07-02 LAB — TECHNOLOGIST REVIEW CHCC SATELLITE

## 2016-07-05 NOTE — Progress Notes (Signed)
Hematology and Oncology Follow Up Visit  [name] [id number] 09-Jan-1929 83 y.o. 07/05/2016   Principle Diagnosis:  Chronic lymphocytic leukemia-stage A   Current Therapy:    Observation     Interim History:  [name] is back for followup. We last saw her 6 months ago. She made it through the holidays without any problems. She's had no issues with influenza area.  She's not had any problems with weight loss. She's had no nausea or vomiting. There's been no change in bowel or bladder habits. She's not noted any palpable lymph glands.  She's had no rash.  There's been no change in medications.  She does have quite a few comorbid conditions.  Overall, her performance status is ECOG 2..  Medications:  Current Outpatient Prescriptions:  .  amLODipine (NORVASC) 5 MG tablet, Take 1 tablet (5 mg total) by mouth daily., Disp: 30 tablet, Rfl: 0 .  Ascorbic Acid (VITAMIN C PO), Take 1 tablet by mouth daily., Disp: , Rfl:  .  aspirin 325 MG tablet, Take 325 mg by mouth daily as needed for pain., Disp: , Rfl:  .  brimonidine (ALPHAGAN) 0.15 % ophthalmic solution, , Disp: , Rfl:  .  Cholecalciferol (VITAMIN D-3 PO), Take 1 capsule by mouth daily., Disp: , Rfl:  .  Cyanocobalamin (VITAMIN B 12 PO), Take by mouth every morning., Disp: , Rfl:  .  dorzolamide-timolol (COSOPT) 22.3-6.8 MG/ML ophthalmic solution, , Disp: , Rfl:  .  latanoprost (XALATAN) 0.005 % ophthalmic solution, , Disp: , Rfl:  .  losartan (COZAAR) 50 MG tablet, Take 1 tablet (50 mg total) by mouth daily., Disp: 30 tablet, Rfl: 0 .  OVER THE COUNTER MEDICATION, Take 1 tablet by mouth daily. Over the counter eye health supplement, Disp: , Rfl:  .  pilocarpine (PILOCAR) 2 % ophthalmic solution, , Disp: , Rfl:  .  timolol (TIMOPTIC) 0.5 % ophthalmic solution, , Disp: , Rfl:  .  VITAMIN E PO, Take 1 tablet by mouth daily., Disp: , Rfl:  .  zinc gluconate 50 MG tablet, Take 50 mg by mouth daily., Disp: , Rfl:    Allergies: No Known Allergies  Past Medical History.  Review of Systems: As above  Physical Exam:  weight is 185 lb (83.9 kg). Her oral temperature is 97.7 F (36.5 C). Her blood pressure is 155/72 (abnormal) and her pulse is 57 (abnormal). Her respiration is 19 and oxygen saturation is 95%.   Somewhat obese white female in no obvious distress. Her head exam shows no adenopathy in the neck. There is no ocular or oral lesions. She has no thyroid is palpable. Lungs are clear. Cardiac exam regular in rhythm with no murmurs rubs or bruits. Abdomen is soft. She has good bowel sounds. There is no fluid wave. There is no palpable abdominal mass. There is no palpable liver or spleen tip. Extremities shows no clubbing cyanosis or edema. Just age-related osteoarthritic changes. Exam shows some slight kyphosis. Axillary exam shows no right axillary adenopathy. Under the left axilla, there is a mobile, 1 cm lymph node that is non-tender. This is stable. Lab Results  Component Value Date   WBC 78.8 (HH) 07/02/2016   HGB 9.4 (L) 07/02/2016   HCT 30.0 (L) 07/02/2016   MCV 94 07/02/2016   PLT 147 07/02/2016     Chemistry      Component Value Date/Time   NA 139 01/02/2016 1147   K 4.0 01/02/2016 1147   CL 98 12/29/2012 2059  CL 101 01/02/2009 0919   CO2 23 01/02/2016 1147   BUN 19.9 01/02/2016 1147   CREATININE 0.8 01/02/2016 1147      Component Value Date/Time   CALCIUM 9.6 01/02/2016 1147   ALKPHOS 88 01/02/2016 1147   AST 12 01/02/2016 1147   ALT 12 01/02/2016 1147   BILITOT 0.42 01/02/2016 1147         Impression and Plan: [name] is an 83 year old white female with CLL. She has stage A disease.   I definitely worried about her CLL progressing. Her white cell count is up quite a bit. Her hemoglobin is down. This, to me, is what might dictate [place] starting therapy on her.  She does have some adenopathy in the axilla. This also could indicate progressive disease that requires  therapy.  Given her age, we do not be cautious with respect to initiating therapy.  I would like to see her back in another month or so.  I think if we have to start her on treatment, I might consider Imbruvica.  I will check her cytogenetic we see her back.  I would like to avoid having to do a bone marrow test on her. Hopefully, all the information that we need week and get from blood work.  I spent about 30 minutes with she and her husband. I went over the lab work with him and explained why I thought we needed to consider treatment on her and why we need to get her back quickly for follow-up. They understand.  [name], MD 4/9/20187:01 AM

## 2016-07-06 DIAGNOSIS — H353133 Nonexudative age-related macular degeneration, bilateral, advanced atrophic without subfoveal involvement: Secondary | ICD-10-CM | POA: Diagnosis not present

## 2016-07-06 DIAGNOSIS — H353221 Exudative age-related macular degeneration, left eye, with active choroidal neovascularization: Secondary | ICD-10-CM | POA: Diagnosis not present

## 2016-07-06 DIAGNOSIS — H353211 Exudative age-related macular degeneration, right eye, with active choroidal neovascularization: Secondary | ICD-10-CM | POA: Diagnosis not present

## 2016-07-26 ENCOUNTER — Other Ambulatory Visit (HOSPITAL_BASED_OUTPATIENT_CLINIC_OR_DEPARTMENT_OTHER): Payer: Medicare HMO

## 2016-07-26 ENCOUNTER — Ambulatory Visit (HOSPITAL_BASED_OUTPATIENT_CLINIC_OR_DEPARTMENT_OTHER): Payer: Medicare HMO | Admitting: Hematology & Oncology

## 2016-07-26 VITALS — BP 126/61 | HR 80 | Temp 98.2°F | Resp 17 | Wt 185.0 lb

## 2016-07-26 DIAGNOSIS — C9110 Chronic lymphocytic leukemia of B-cell type not having achieved remission: Secondary | ICD-10-CM

## 2016-07-26 DIAGNOSIS — C9190 Lymphoid leukemia, unspecified not having achieved remission: Secondary | ICD-10-CM | POA: Diagnosis not present

## 2016-07-26 LAB — MANUAL DIFFERENTIAL (CHCC SATELLITE)
ALC: 71.6 10*3/uL — AB (ref 0.6–2.2)
ANC (CHCC HP manual diff): 16.1 10*3/uL — ABNORMAL HIGH (ref 1.5–6.7)
LYMPH: 80 % — ABNORMAL HIGH (ref 14–48)
MONO: 2 % (ref 0–13)
PLT EST ~~LOC~~: ADEQUATE
SEG: 18 % — AB (ref 40–75)

## 2016-07-26 LAB — CMP (CANCER CENTER ONLY)
ALBUMIN: 3.7 g/dL (ref 3.3–5.5)
ALT(SGPT): 17 U/L (ref 10–47)
AST: 21 U/L (ref 11–38)
Alkaline Phosphatase: 72 U/L (ref 26–84)
BILIRUBIN TOTAL: 1.7 mg/dL — AB (ref 0.20–1.60)
BUN, Bld: 13 mg/dL (ref 7–22)
CALCIUM: 9.8 mg/dL (ref 8.0–10.3)
CO2: 31 meq/L (ref 18–33)
Chloride: 104 mEq/L (ref 98–108)
Creat: 0.9 mg/dl (ref 0.6–1.2)
Glucose, Bld: 109 mg/dL (ref 73–118)
Potassium: 4.3 mEq/L (ref 3.3–4.7)
Sodium: 140 mEq/L (ref 128–145)
Total Protein: 5.7 g/dL — ABNORMAL LOW (ref 6.4–8.1)

## 2016-07-26 LAB — CBC WITH DIFFERENTIAL (CANCER CENTER ONLY)
HEMATOCRIT: 27.8 % — AB (ref 34.8–46.6)
HEMOGLOBIN: 8.6 g/dL — AB (ref 11.6–15.9)
MCH: 33.1 pg (ref 26.0–34.0)
MCHC: 30.9 g/dL — AB (ref 32.0–36.0)
MCV: 107 fL — ABNORMAL HIGH (ref 81–101)
Platelets: 152 10*3/uL (ref 145–400)
RBC: 2.6 10*6/uL — ABNORMAL LOW (ref 3.70–5.32)
RDW: 21.4 % — ABNORMAL HIGH (ref 11.1–15.7)
WBC: 89.5 10*3/uL (ref 3.9–10.0)

## 2016-07-26 LAB — LACTATE DEHYDROGENASE: LDH: 191 U/L (ref 125–245)

## 2016-07-26 MED ORDER — IBRUTINIB 420 MG PO TABS
420.0000 mg | ORAL_TABLET | Freq: Every day | ORAL | 4 refills | Status: DC
Start: 2016-07-26 — End: 2016-12-02

## 2016-07-26 MED ORDER — IBRUTINIB 420 MG PO TABS: 420.0000 mg | ORAL_TABLET | Freq: Every day | ORAL | 4 refills | Status: DC

## 2016-07-26 NOTE — Progress Notes (Signed)
Hematology and Oncology Follow Up Visit  [name] [id number] Nov 02, 1928 83 y.o. 07/26/2016   Principle Diagnosis:  Chronic lymphocytic leukemia-stage A   Current Therapy:    Observation     Interim History:  [name] is back for followup. We saw her about 3 weeks ago, her white cell count had gone up quite a bit. As such, I felt that her CLL was relapsing.  She is feeling okay. She has not noted any swollen lymph nodes. She's had no fever. She's had no rashes. She's had no change in bowel or bladder habits.  She and her family were at the beach last week. It was a little bit cold.   She's had some fatigue. She's had no mouth sores. She's had no visual issues that are new. I think she does have some macular degeneration.   She's had no bleeding.   Overall, her performance status is ECOG 2..  Medications:  Current Outpatient Prescriptions:  .  amLODipine (NORVASC) 5 MG tablet, Take 1 tablet (5 mg total) by mouth daily., Disp: 30 tablet, Rfl: 0 .  Ascorbic Acid (VITAMIN C PO), Take 1 tablet by mouth daily., Disp: , Rfl:  .  aspirin 325 MG tablet, Take 325 mg by mouth daily as needed for pain., Disp: , Rfl:  .  brimonidine (ALPHAGAN) 0.15 % ophthalmic solution, , Disp: , Rfl:  .  Cholecalciferol (VITAMIN D-3 PO), Take 1 capsule by mouth daily., Disp: , Rfl:  .  Cyanocobalamin (VITAMIN B 12 PO), Take by mouth every morning., Disp: , Rfl:  .  dorzolamide-timolol (COSOPT) 22.3-6.8 MG/ML ophthalmic solution, , Disp: , Rfl:  .  Ibrutinib (IMBRUVICA) 420 MG TABS, Take 420 mg by mouth daily., Disp: 30 tablet, Rfl: 4 .  latanoprost (XALATAN) 0.005 % ophthalmic solution, , Disp: , Rfl:  .  losartan (COZAAR) 50 MG tablet, Take 1 tablet (50 mg total) by mouth daily., Disp: 30 tablet, Rfl: 0 .  OVER THE COUNTER MEDICATION, Take 1 tablet by mouth daily. Over the counter eye health supplement, Disp: , Rfl:  .  pilocarpine (PILOCAR) 2 % ophthalmic solution, , Disp: , Rfl:  .  timolol  (TIMOPTIC) 0.5 % ophthalmic solution, , Disp: , Rfl:  .  VITAMIN E PO, Take 1 tablet by mouth daily., Disp: , Rfl:  .  zinc gluconate 50 MG tablet, Take 50 mg by mouth daily., Disp: , Rfl:   Allergies: No Known Allergies  Past Medical History.  Review of Systems: As above  Physical Exam:  weight is 185 lb (83.9 kg). Her oral temperature is 98.2 F (36.8 C). Her blood pressure is 126/61 and her pulse is 80. Her respiration is 17 and oxygen saturation is 87% (abnormal).   Somewhat obese white female in no obvious distress. Her head exam shows no adenopathy in the neck. There is no ocular or oral lesions. She has no thyroid is palpable. Lungs are clear. Cardiac exam regular in rhythm with no murmurs rubs or bruits. Abdomen is soft. She has good bowel sounds. There is no fluid wave. There is no palpable abdominal mass. There is no palpable liver or spleen tip. Extremities shows no clubbing cyanosis or edema. Just age-related osteoarthritic changes. Exam shows some slight kyphosis. Axillary exam shows no right axillary adenopathy. Under the left axilla, there is a mobile, 1 cm lymph node that is non-tender. This is stable. Lab Results  Component Value Date   WBC 89.5 (HH) 07/26/2016   HGB 8.6 (  L) 07/26/2016   HCT 27.8 (L) 07/26/2016   MCV 107 (H) 07/26/2016   PLT 152 07/26/2016     Chemistry      Component Value Date/Time   NA 140 07/26/2016 1354   NA 139 01/02/2016 1147   K 4.3 07/26/2016 1354   K 4.0 01/02/2016 1147   CL 104 07/26/2016 1354   CO2 31 07/26/2016 1354   CO2 23 01/02/2016 1147   BUN 13 07/26/2016 1354   BUN 19.9 01/02/2016 1147   CREATININE 0.9 07/26/2016 1354   CREATININE 0.8 01/02/2016 1147      Component Value Date/Time   CALCIUM 9.8 07/26/2016 1354   CALCIUM 9.6 01/02/2016 1147   ALKPHOS 72 07/26/2016 1354   ALKPHOS 88 01/02/2016 1147   AST 21 07/26/2016 1354   AST 12 01/02/2016 1147   ALT 17 07/26/2016 1354   ALT 12 01/02/2016 1147   BILITOT 1.70 (H)  07/26/2016 1354   BILITOT 0.42 01/02/2016 1147         Impression and Plan: [name] is an 83 year old white female with CLL. She has stage A disease.   I believe that we now have to treat her. Her hemoglobin is going down. I think this is a reflection of her bone marrow being filled with CLL.  Given her age, and other health issues, I really don't think that we had to put her through a full workup. I don't think we have do scans. I don't think we are to do a bone marrow test on her. I will get cytogenetics off her peripheral blood.   I think that she would be a very good candidate for Imbruvica. I think this would be very effective for her.  I talked to she and her husband about this. I spent about 40 minutes with them. I Splane to them what Imbruvica was. I went over the side effects.  I would start her on a 420 mg dose. I did this would be appropriate for her. We can certainly does reduce if necessary.   I went over the side effects. I talked her about the risk of atrial fibrillation. We will have to be careful with this.  We will get her back in about 2 or 3 weeks. At that point, she should have started the [place].   [name], MD 4/30/20184:55 PM

## 2016-07-27 LAB — BETA 2 MICROGLOBULIN, SERUM: Beta-2: 3.6 mg/L — ABNORMAL HIGH (ref 0.6–2.4)

## 2016-07-27 MED FILL — IMBRUVICA 420 MG TAB: 420 | 28 days supply | Qty: 28 | Fill #0

## 2016-08-06 ENCOUNTER — Telehealth: Payer: Self-pay | Admitting: *Deleted

## 2016-08-06 ENCOUNTER — Ambulatory Visit (HOSPITAL_BASED_OUTPATIENT_CLINIC_OR_DEPARTMENT_OTHER): Payer: Medicare HMO | Admitting: Hematology & Oncology

## 2016-08-06 ENCOUNTER — Encounter: Payer: Self-pay | Admitting: *Deleted

## 2016-08-06 ENCOUNTER — Other Ambulatory Visit (HOSPITAL_BASED_OUTPATIENT_CLINIC_OR_DEPARTMENT_OTHER): Payer: Medicare HMO

## 2016-08-06 VITALS — BP 146/55 | HR 71 | Temp 98.7°F | Resp 18 | Wt 178.0 lb

## 2016-08-06 DIAGNOSIS — C9110 Chronic lymphocytic leukemia of B-cell type not having achieved remission: Secondary | ICD-10-CM

## 2016-08-06 LAB — CMP (CANCER CENTER ONLY)
ALT(SGPT): 15 U/L (ref 10–47)
AST: 21 U/L (ref 11–38)
Albumin: 3.7 g/dL (ref 3.3–5.5)
Alkaline Phosphatase: 63 U/L (ref 26–84)
BILIRUBIN TOTAL: 2.4 mg/dL — AB (ref 0.20–1.60)
BUN: 18 mg/dL (ref 7–22)
CALCIUM: 9.5 mg/dL (ref 8.0–10.3)
CO2: 30 meq/L (ref 18–33)
CREATININE: 1 mg/dL (ref 0.6–1.2)
Chloride: 104 mEq/L (ref 98–108)
GLUCOSE: 108 mg/dL (ref 73–118)
Potassium: 4.2 mEq/L (ref 3.3–4.7)
SODIUM: 139 meq/L (ref 128–145)
Total Protein: 5.9 g/dL — ABNORMAL LOW (ref 6.4–8.1)

## 2016-08-06 LAB — CBC WITH DIFFERENTIAL (CANCER CENTER ONLY)
BASO#: 0 10*3/uL (ref 0.0–0.2)
BASO%: 0 % (ref 0.0–2.0)
EOS%: 0.2 % (ref 0.0–7.0)
Eosinophils Absolute: 0.2 10*3/uL (ref 0.0–0.5)
HCT: 28.4 % — ABNORMAL LOW (ref 34.8–46.6)
HGB: 8.5 g/dL — ABNORMAL LOW (ref 11.6–15.9)
LYMPH#: 114.1 10*3/uL — AB (ref 0.9–3.3)
LYMPH%: 95.8 % — AB (ref 14.0–48.0)
MCH: 32.2 pg (ref 26.0–34.0)
MCHC: 29.9 g/dL — AB (ref 32.0–36.0)
MCV: 108 fL — ABNORMAL HIGH (ref 81–101)
MONO#: 0.6 10*3/uL (ref 0.1–0.9)
MONO%: 0.5 % (ref 0.0–13.0)
NEUT%: 3.5 % — ABNORMAL LOW (ref 39.6–80.0)
NEUTROS ABS: 4.3 10*3/uL (ref 1.5–6.5)
PLATELETS: 134 10*3/uL — AB (ref 145–400)
RBC: 2.64 10*6/uL — ABNORMAL LOW (ref 3.70–5.32)
RDW: 21.9 % — AB (ref 11.1–15.7)
WBC: 119.1 10*3/uL — AB (ref 3.9–10.0)

## 2016-08-06 NOTE — Progress Notes (Signed)
Hematology and Oncology Follow Up Visit  [name] [id number] 06-03-1928 83 y.o. 08/06/2016   Principle Diagnosis:  Chronic lymphocytic leukemia-stage A   Current Therapy:    Imbruvica 420mg  po q day     Interim History:  [name] is back for followup. She's now been on Imbruvica for 1 week. She is tolerated this pretty well today.  She has had no problems with nausea or vomiting. She's had no cough. She does have a little bit of fatigue.  There's been no rashes. She's had no leg swelling.  She's had no headache. There's been no mouth sores.  She's not felt any swollen lymph nodes.  Overall, her performance status is ECOG 2..  Medications:  Current Outpatient Prescriptions:  .  amLODipine (NORVASC) 5 MG tablet, Take 1 tablet (5 mg total) by mouth daily., Disp: 30 tablet, Rfl: 0 .  Ascorbic Acid (VITAMIN C PO), Take 1 tablet by mouth daily., Disp: , Rfl:  .  aspirin 325 MG tablet, Take 325 mg by mouth daily as needed for pain., Disp: , Rfl:  .  brimonidine (ALPHAGAN) 0.15 % ophthalmic solution, , Disp: , Rfl:  .  Cholecalciferol (VITAMIN D-3 PO), Take 1 capsule by mouth daily., Disp: , Rfl:  .  Cyanocobalamin (VITAMIN B 12 PO), Take by mouth every morning., Disp: , Rfl:  .  dorzolamide-timolol (COSOPT) 22.3-6.8 MG/ML ophthalmic solution, , Disp: , Rfl:  .  Ibrutinib (IMBRUVICA) 420 MG TABS, Take 420 mg by mouth daily., Disp: 30 tablet, Rfl: 4 .  latanoprost (XALATAN) 0.005 % ophthalmic solution, , Disp: , Rfl:  .  losartan (COZAAR) 50 MG tablet, Take 1 tablet (50 mg total) by mouth daily., Disp: 30 tablet, Rfl: 0 .  OVER THE COUNTER MEDICATION, Take 1 tablet by mouth daily. Over the counter eye health supplement, Disp: , Rfl:  .  pilocarpine (PILOCAR) 2 % ophthalmic solution, , Disp: , Rfl:  .  timolol (TIMOPTIC) 0.5 % ophthalmic solution, , Disp: , Rfl:  .  VITAMIN E PO, Take 1 tablet by mouth daily., Disp: , Rfl:  .  zinc gluconate 50 MG tablet, Take 50 mg by mouth  daily., Disp: , Rfl:   Allergies: No Known Allergies  Past Medical History.  Review of Systems: As above  Physical Exam:  weight is 178 lb (80.7 kg). Her oral temperature is 98.7 F (37.1 C). Her blood pressure is 146/55 (abnormal) and her pulse is 71. Her respiration is 18 and oxygen saturation is 88% (abnormal).   Somewhat obese white female in no obvious distress. Her head and neck exam shows no adenopathy in the neck. There is no ocular or oral lesions. She has no thyroid is palpable. Lungs are clear. Cardiac exam regular in rhythm with no murmurs rubs or bruits. Abdomen is soft. She has good bowel sounds. There is no fluid wave. There is no palpable abdominal mass. There is no palpable liver or spleen tip. Extremities shows no clubbing cyanosis or edema. Just age-related osteoarthritic changes. Exam shows some slight kyphosis. Axillary exam shows no right axillary adenopathy. Under the left axilla, there is a mobile, 1 cm lymph node that is non-tender. This is stable. Lab Results  Component Value Date   WBC 119.1 (HH) 08/06/2016   HGB 8.5 (L) 08/06/2016   HCT 28.4 (L) 08/06/2016   MCV 108 (H) 08/06/2016   PLT 134 (L) 08/06/2016     Chemistry      Component Value Date/Time  NA 139 08/06/2016 1453   NA 139 01/02/2016 1147   K 4.2 08/06/2016 1453   K 4.0 01/02/2016 1147   CL 104 08/06/2016 1453   CO2 30 08/06/2016 1453   CO2 23 01/02/2016 1147   BUN 18 08/06/2016 1453   BUN 19.9 01/02/2016 1147   CREATININE 1.0 08/06/2016 1453   CREATININE 0.8 01/02/2016 1147      Component Value Date/Time   CALCIUM 9.5 08/06/2016 1453   CALCIUM 9.6 01/02/2016 1147   ALKPHOS 63 08/06/2016 1453   ALKPHOS 88 01/02/2016 1147   AST 21 08/06/2016 1453   AST 12 01/02/2016 1147   ALT 15 08/06/2016 1453   ALT 12 01/02/2016 1147   BILITOT 2.40 (H) 08/06/2016 1453   BILITOT 0.42 01/02/2016 1147         Impression and Plan: [name] is an 83 year old white female with CLL. She has  stage C disease.   It does not surprise me that her white cell count is up a little bit more. This is very typical for Imbruvica. I really cannot find anything on her physical exam that would suggest actual CLL progression.  We have to watch her blood count weekly. I am a little bit concerned about her hemoglobin. If I see that her hemoglobin is trending downward, then we will have to transfuse her. I talked to she and her husband about this. I explained to them that the blood supply is safe. I explained how a blood transfusion is done. I explained why I thought she might benefit from a transfusion if her hemoglobin drops further.  I have to believe that the white cell count will begin to trend downward. Hopefully, the hemoglobin will begin to improve.   I would like to see her back myself in about 3 weeks.   I spent about 25 minutes with she and her husband.    [name], MD 5/11/20183:54 PM

## 2016-08-06 NOTE — Telephone Encounter (Signed)
Oral Chemotherapy Follow-Up Form Original Start date of oral chemotherapy: 07/30/2016 ? Called patient today to follow up regarding patient's oral chemotherapy medication: Imbruvica 420mg  daily Pt is doing well today Pt reports 0 tablets/doses missed in the last week/month.   Pt reports the following side effects: None

## 2016-08-06 NOTE — Telephone Encounter (Signed)
Critical Value WBC 119.1 Dr Marin Olp notified. No orders at this time

## 2016-08-09 LAB — LACTATE DEHYDROGENASE: LDH: 202 U/L (ref 125–245)

## 2016-08-13 ENCOUNTER — Other Ambulatory Visit (HOSPITAL_BASED_OUTPATIENT_CLINIC_OR_DEPARTMENT_OTHER): Payer: Medicare HMO

## 2016-08-13 ENCOUNTER — Other Ambulatory Visit: Payer: Medicare HMO

## 2016-08-13 DIAGNOSIS — C9110 Chronic lymphocytic leukemia of B-cell type not having achieved remission: Secondary | ICD-10-CM

## 2016-08-13 DIAGNOSIS — C9190 Lymphoid leukemia, unspecified not having achieved remission: Secondary | ICD-10-CM | POA: Diagnosis not present

## 2016-08-13 LAB — CBC WITH DIFFERENTIAL (CANCER CENTER ONLY)
BASO#: 0 10*3/uL (ref 0.0–0.2)
BASO%: 0 % (ref 0.0–2.0)
EOS ABS: 0.2 10*3/uL (ref 0.0–0.5)
EOS%: 0.2 % (ref 0.0–7.0)
HCT: 27.7 % — ABNORMAL LOW (ref 34.8–46.6)
HEMOGLOBIN: 8.3 g/dL — AB (ref 11.6–15.9)
LYMPH#: 97.5 10*3/uL — ABNORMAL HIGH (ref 0.9–3.3)
LYMPH%: 95.8 % — ABNORMAL HIGH (ref 14.0–48.0)
MCH: 32.5 pg (ref 26.0–34.0)
MCHC: 30 g/dL — AB (ref 32.0–36.0)
MCV: 109 fL — ABNORMAL HIGH (ref 81–101)
MONO#: 0.7 10*3/uL (ref 0.1–0.9)
MONO%: 0.7 % (ref 0.0–13.0)
NEUT#: 3.4 10*3/uL (ref 1.5–6.5)
NEUT%: 3.3 % — AB (ref 39.6–80.0)
Platelets: 133 10*3/uL — ABNORMAL LOW (ref 145–400)
RBC: 2.55 10*6/uL — ABNORMAL LOW (ref 3.70–5.32)
RDW: 21.5 % — ABNORMAL HIGH (ref 11.1–15.7)
WBC: 101.8 10*3/uL (ref 3.9–10.0)

## 2016-08-17 DIAGNOSIS — H353113 Nonexudative age-related macular degeneration, right eye, advanced atrophic without subfoveal involvement: Secondary | ICD-10-CM | POA: Diagnosis not present

## 2016-08-17 DIAGNOSIS — H353221 Exudative age-related macular degeneration, left eye, with active choroidal neovascularization: Secondary | ICD-10-CM | POA: Diagnosis not present

## 2016-08-17 DIAGNOSIS — H353211 Exudative age-related macular degeneration, right eye, with active choroidal neovascularization: Secondary | ICD-10-CM | POA: Diagnosis not present

## 2016-08-20 ENCOUNTER — Other Ambulatory Visit (HOSPITAL_BASED_OUTPATIENT_CLINIC_OR_DEPARTMENT_OTHER): Payer: Medicare HMO

## 2016-08-20 DIAGNOSIS — C9110 Chronic lymphocytic leukemia of B-cell type not having achieved remission: Secondary | ICD-10-CM | POA: Diagnosis not present

## 2016-08-20 LAB — CBC WITH DIFFERENTIAL (CANCER CENTER ONLY)
BASO#: 0 10*3/uL (ref 0.0–0.2)
BASO%: 0 % (ref 0.0–2.0)
EOS%: 0.2 % (ref 0.0–7.0)
Eosinophils Absolute: 0.2 10*3/uL (ref 0.0–0.5)
HEMATOCRIT: 27.5 % — AB (ref 34.8–46.6)
HEMOGLOBIN: 8.4 g/dL — AB (ref 11.6–15.9)
LYMPH#: 88.3 10*3/uL — AB (ref 0.9–3.3)
LYMPH%: 94.5 % — ABNORMAL HIGH (ref 14.0–48.0)
MCH: 33.3 pg (ref 26.0–34.0)
MCHC: 30.5 g/dL — ABNORMAL LOW (ref 32.0–36.0)
MCV: 109 fL — AB (ref 81–101)
MONO#: 0.6 10*3/uL (ref 0.1–0.9)
MONO%: 0.6 % (ref 0.0–13.0)
NEUT%: 4.7 % — AB (ref 39.6–80.0)
NEUTROS ABS: 4.4 10*3/uL (ref 1.5–6.5)
Platelets: 142 10*3/uL — ABNORMAL LOW (ref 145–400)
RBC: 2.52 10*6/uL — AB (ref 3.70–5.32)
RDW: 21.2 % — AB (ref 11.1–15.7)
WBC: 93.5 10*3/uL (ref 3.9–10.0)

## 2016-08-20 MED FILL — IMBRUVICA 420 MG TAB: 420 | 28 days supply | Qty: 28 | Fill #1

## 2016-08-24 DIAGNOSIS — H401113 Primary open-angle glaucoma, right eye, severe stage: Secondary | ICD-10-CM | POA: Diagnosis not present

## 2016-08-24 DIAGNOSIS — Z961 Presence of intraocular lens: Secondary | ICD-10-CM | POA: Diagnosis not present

## 2016-08-24 DIAGNOSIS — H40012 Open angle with borderline findings, low risk, left eye: Secondary | ICD-10-CM | POA: Diagnosis not present

## 2016-08-27 ENCOUNTER — Other Ambulatory Visit: Payer: Medicare HMO

## 2016-08-27 ENCOUNTER — Ambulatory Visit: Payer: Medicare HMO | Admitting: Hematology & Oncology

## 2016-08-30 ENCOUNTER — Encounter: Payer: Self-pay | Admitting: Hematology & Oncology

## 2016-08-30 DIAGNOSIS — H401113 Primary open-angle glaucoma, right eye, severe stage: Secondary | ICD-10-CM | POA: Diagnosis not present

## 2016-08-31 ENCOUNTER — Ambulatory Visit: Payer: Medicare HMO | Admitting: Family

## 2016-08-31 ENCOUNTER — Other Ambulatory Visit: Payer: Medicare HMO

## 2016-08-31 LAB — FISH,CLL PROGNOSTIC PANEL

## 2016-09-03 ENCOUNTER — Telehealth: Payer: Self-pay | Admitting: *Deleted

## 2016-09-03 ENCOUNTER — Other Ambulatory Visit (HOSPITAL_BASED_OUTPATIENT_CLINIC_OR_DEPARTMENT_OTHER): Payer: Medicare HMO

## 2016-09-03 ENCOUNTER — Ambulatory Visit (HOSPITAL_BASED_OUTPATIENT_CLINIC_OR_DEPARTMENT_OTHER): Payer: Medicare HMO | Admitting: Family

## 2016-09-03 VITALS — BP 153/62 | HR 60 | Temp 98.2°F | Resp 16 | Wt 174.0 lb

## 2016-09-03 DIAGNOSIS — C9110 Chronic lymphocytic leukemia of B-cell type not having achieved remission: Secondary | ICD-10-CM | POA: Diagnosis not present

## 2016-09-03 LAB — CMP (CANCER CENTER ONLY)
ALK PHOS: 44 U/L (ref 26–84)
ALT: 17 U/L (ref 10–47)
AST: 17 U/L (ref 11–38)
Albumin: 3.8 g/dL (ref 3.3–5.5)
BILIRUBIN TOTAL: 2.3 mg/dL — AB (ref 0.20–1.60)
BUN, Bld: 15 mg/dL (ref 7–22)
CO2: 29 mEq/L (ref 18–33)
CREATININE: 0.9 mg/dL (ref 0.6–1.2)
Calcium: 9.7 mg/dL (ref 8.0–10.3)
Chloride: 109 mEq/L — ABNORMAL HIGH (ref 98–108)
Glucose, Bld: 126 mg/dL — ABNORMAL HIGH (ref 73–118)
POTASSIUM: 4.3 meq/L (ref 3.3–4.7)
Sodium: 141 mEq/L (ref 128–145)
TOTAL PROTEIN: 5.8 g/dL — AB (ref 6.4–8.1)

## 2016-09-03 LAB — CBC WITH DIFFERENTIAL (CANCER CENTER ONLY)
BASO#: 0 10*3/uL (ref 0.0–0.2)
BASO%: 0 % (ref 0.0–2.0)
EOS%: 0.1 % (ref 0.0–7.0)
Eosinophils Absolute: 0.1 10*3/uL (ref 0.0–0.5)
HCT: 28.8 % — ABNORMAL LOW (ref 34.8–46.6)
HGB: 8.9 g/dL — ABNORMAL LOW (ref 11.6–15.9)
LYMPH#: 68.1 10*3/uL — AB (ref 0.9–3.3)
LYMPH%: 93.2 % — AB (ref 14.0–48.0)
MCH: 32.6 pg (ref 26.0–34.0)
MCHC: 30.9 g/dL — ABNORMAL LOW (ref 32.0–36.0)
MCV: 106 fL — AB (ref 81–101)
MONO#: 0.6 10*3/uL (ref 0.1–0.9)
MONO%: 0.8 % (ref 0.0–13.0)
NEUT#: 4.3 10*3/uL (ref 1.5–6.5)
NEUT%: 5.9 % — ABNORMAL LOW (ref 39.6–80.0)
Platelets: 131 10*3/uL — ABNORMAL LOW (ref 145–400)
RBC: 2.73 10*6/uL — AB (ref 3.70–5.32)
RDW: 20.3 % — ABNORMAL HIGH (ref 11.1–15.7)
WBC: 73 10*3/uL — AB (ref 3.9–10.0)

## 2016-09-03 LAB — LACTATE DEHYDROGENASE: LDH: 200 U/L (ref 125–245)
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
## 2016-09-03 NOTE — Telephone Encounter (Signed)
Critical Value WBC 73 Laverna Peace NP notified. No orders at this time

## 2016-09-10 ENCOUNTER — Other Ambulatory Visit: Payer: Medicare HMO

## 2016-09-13 ENCOUNTER — Telehealth: Payer: Self-pay | Admitting: *Deleted

## 2016-09-13 NOTE — Telephone Encounter (Signed)
Patient stating that mold has been found under her home and she has people fixing the issue now. She doesn't know how extensive it is, but she is currently on Imbruvica and she'd like to know if its okay to stay in the home, or if she should stay somewhere else while it's being worked on.   Reviewed with Dr Marin Olp. He does not feel like the patient needs to leave her home. Patient is aware and appreciative of information

## 2016-09-17 ENCOUNTER — Other Ambulatory Visit: Payer: Medicare HMO

## 2016-09-21 MED FILL — IMBRUVICA 420 MG TAB: 420 | 28 days supply | Qty: 28 | Fill #2

## 2016-09-24 ENCOUNTER — Other Ambulatory Visit (HOSPITAL_BASED_OUTPATIENT_CLINIC_OR_DEPARTMENT_OTHER): Payer: Medicare HMO

## 2016-09-24 DIAGNOSIS — C9110 Chronic lymphocytic leukemia of B-cell type not having achieved remission: Secondary | ICD-10-CM

## 2016-09-24 DIAGNOSIS — C9190 Lymphoid leukemia, unspecified not having achieved remission: Secondary | ICD-10-CM | POA: Diagnosis not present

## 2016-09-24 LAB — CBC WITH DIFFERENTIAL (CANCER CENTER ONLY)
BASO#: 0 10*3/uL (ref 0.0–0.2)
BASO%: 0 % (ref 0.0–2.0)
EOS ABS: 0.1 10*3/uL (ref 0.0–0.5)
EOS%: 0.2 % (ref 0.0–7.0)
HEMATOCRIT: 29.6 % — AB (ref 34.8–46.6)
HGB: 9.2 g/dL — ABNORMAL LOW (ref 11.6–15.9)
LYMPH#: 44.3 10*3/uL — AB (ref 0.9–3.3)
LYMPH%: 89.8 % — ABNORMAL HIGH (ref 14.0–48.0)
MCH: 32.2 pg (ref 26.0–34.0)
MCHC: 31.1 g/dL — AB (ref 32.0–36.0)
MCV: 104 fL — AB (ref 81–101)
MONO#: 0.6 10*3/uL (ref 0.1–0.9)
MONO%: 1.2 % (ref 0.0–13.0)
NEUT#: 4.4 10*3/uL (ref 1.5–6.5)
NEUT%: 8.8 % — AB (ref 39.6–80.0)
Platelets: 131 10*3/uL — ABNORMAL LOW (ref 145–400)
RBC: 2.86 10*6/uL — ABNORMAL LOW (ref 3.70–5.32)
RDW: 20.2 % — AB (ref 11.1–15.7)
WBC: 49.3 10*3/uL — ABNORMAL HIGH (ref 3.9–10.0)

## 2016-09-27 ENCOUNTER — Telehealth: Payer: Self-pay | Admitting: *Deleted

## 2016-09-27 NOTE — Telephone Encounter (Addendum)
Patient is aware of results  ----- Message from [name], MD sent at 09/24/2016  5:11 PM EDT ----- Please call and tell her that the white cell count is now down to 49,000. Her hemoglobin is up to 9.2. Everything is moving in the right direction. I'm so happy for her. Have a great July 4 holiday!!!

## 2016-10-13 DIAGNOSIS — H353212 Exudative age-related macular degeneration, right eye, with inactive choroidal neovascularization: Secondary | ICD-10-CM | POA: Diagnosis not present

## 2016-10-13 DIAGNOSIS — H353114 Nonexudative age-related macular degeneration, right eye, advanced atrophic with subfoveal involvement: Secondary | ICD-10-CM | POA: Diagnosis not present

## 2016-10-13 DIAGNOSIS — H35361 Drusen (degenerative) of macula, right eye: Secondary | ICD-10-CM | POA: Diagnosis not present

## 2016-10-13 DIAGNOSIS — H353222 Exudative age-related macular degeneration, left eye, with inactive choroidal neovascularization: Secondary | ICD-10-CM | POA: Diagnosis not present

## 2016-10-15 ENCOUNTER — Ambulatory Visit (HOSPITAL_BASED_OUTPATIENT_CLINIC_OR_DEPARTMENT_OTHER): Payer: Medicare HMO | Admitting: Hematology & Oncology

## 2016-10-15 ENCOUNTER — Other Ambulatory Visit (HOSPITAL_BASED_OUTPATIENT_CLINIC_OR_DEPARTMENT_OTHER): Payer: Medicare HMO

## 2016-10-15 VITALS — BP 151/61 | HR 69 | Temp 97.8°F | Resp 19 | Wt 178.0 lb

## 2016-10-15 DIAGNOSIS — C9110 Chronic lymphocytic leukemia of B-cell type not having achieved remission: Secondary | ICD-10-CM

## 2016-10-15 LAB — COMPREHENSIVE METABOLIC PANEL
ALT: 8 U/L (ref 0–55)
AST: 12 U/L (ref 5–34)
Albumin: 3.8 g/dL (ref 3.5–5.0)
Alkaline Phosphatase: 46 U/L (ref 40–150)
Anion Gap: 7 mEq/L (ref 3–11)
BILIRUBIN TOTAL: 2.09 mg/dL — AB (ref 0.20–1.20)
BUN: 18.6 mg/dL (ref 7.0–26.0)
CHLORIDE: 107 meq/L (ref 98–109)
CO2: 27 meq/L (ref 22–29)
CREATININE: 0.7 mg/dL (ref 0.6–1.1)
Calcium: 10.2 mg/dL (ref 8.4–10.4)
EGFR: 77 mL/min/{1.73_m2} — ABNORMAL LOW (ref >90)
Glucose: 119 mg/dl (ref 70–140)
Potassium: 4.1 mEq/L (ref 3.5–5.1)
Sodium: 140 mEq/L (ref 136–145)
TOTAL PROTEIN: 5.7 g/dL — AB (ref 6.4–8.3)

## 2016-10-15 LAB — CBC WITH DIFFERENTIAL (CANCER CENTER ONLY)
BASO#: 0 10*3/uL (ref 0.0–0.2)
BASO%: 0 % (ref 0.0–2.0)
EOS%: 0.2 % (ref 0.0–7.0)
Eosinophils Absolute: 0.1 10*3/uL (ref 0.0–0.5)
HEMATOCRIT: 29.7 % — AB (ref 34.8–46.6)
HEMOGLOBIN: 9.4 g/dL — AB (ref 11.6–15.9)
LYMPH#: 37.4 10*3/uL — AB (ref 0.9–3.3)
LYMPH%: 88.9 % — ABNORMAL HIGH (ref 14.0–48.0)
MCH: 31.9 pg (ref 26.0–34.0)
MCHC: 31.6 g/dL — ABNORMAL LOW (ref 32.0–36.0)
MCV: 101 fL (ref 81–101)
MONO#: 0.6 10*3/uL (ref 0.1–0.9)
MONO%: 1.3 % (ref 0.0–13.0)
NEUT%: 9.6 % — AB (ref 39.6–80.0)
NEUTROS ABS: 4 10*3/uL (ref 1.5–6.5)
Platelets: 132 10*3/uL — ABNORMAL LOW (ref 145–400)
RBC: 2.95 10*6/uL — ABNORMAL LOW (ref 3.70–5.32)
RDW: 19.6 % — AB (ref 11.1–15.7)
WBC: 42.1 10*3/uL — ABNORMAL HIGH (ref 3.9–10.0)

## 2016-10-15 LAB — LACTATE DEHYDROGENASE: LDH: 219 U/L (ref 125–245)
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
## 2016-10-19 MED FILL — IMBRUVICA 420 MG TAB: 420 | 28 days supply | Qty: 28 | Fill #3 | Status: TO

## 2016-11-04 ENCOUNTER — Telehealth: Payer: Self-pay | Admitting: Pharmacist

## 2016-11-04 NOTE — Telephone Encounter (Signed)
Oral Chemotherapy Pharmacist Encounter   Attempted to reach patient for follow up on oral medication: Ibrutinib. No answer at home or on mobile. Unable to LVM  Thank you,  Nuala Alpha, PharmD, BCPS 11/04/2016 10:29 AM Oral Oncology Clinic

## 2016-11-11 MED FILL — IMBRUVICA 420 MG TAB: 420 | 28 days supply | Qty: 28 | Fill #0

## 2016-11-11 NOTE — Telephone Encounter (Signed)
Oral Chemotherapy Pharmacist Encounter  Follow-Up Form  Called patient today to follow up regarding patient's oral chemotherapy medication: Ibrutinib  Original Start date of oral chemotherapy: 07/30/16  Pt is doing well today  Pt reports 0 tablets/doses of ibrutinib missed in the last 2 weeks.  Missed dose(s) attributed to: N/A  Pt reports the following side effects: [name] reported that her hands have been feeling a little numb like she has on gloves and her nails have been weak/jagged   Recent labs reviewed: CBC from 10/15/16  Other Issues: N/A  Patient knows to call the office with questions or concerns. Oral Oncology Clinic will continue to follow.  Thank you,  Darl Pikes, PharmD, BCPS Hematology/Oncology Clinical Pharmacist [facility] [phone]  11/11/2016 10:31 AM

## 2016-11-15 ENCOUNTER — Other Ambulatory Visit (HOSPITAL_BASED_OUTPATIENT_CLINIC_OR_DEPARTMENT_OTHER): Payer: Medicare HMO

## 2016-11-15 DIAGNOSIS — C9110 Chronic lymphocytic leukemia of B-cell type not having achieved remission: Secondary | ICD-10-CM

## 2016-11-15 LAB — MANUAL DIFFERENTIAL (CHCC SATELLITE)
ALC: 18.2 10*3/uL — ABNORMAL HIGH (ref 0.6–2.2)
ANC (CHCC HP manual diff): 5.1 10*3/uL (ref 1.5–6.7)
Eos: 2 % (ref 0–7)
LYMPH: 75 % — AB (ref 14–48)
MONO: 2 % (ref 0–13)
PLT EST ~~LOC~~: DECREASED
SEG: 21 % — AB (ref 40–75)

## 2016-11-15 LAB — CBC WITH DIFFERENTIAL (CANCER CENTER ONLY)
HEMATOCRIT: 32.2 % — AB (ref 34.8–46.6)
HEMOGLOBIN: 10.2 g/dL — AB (ref 11.6–15.9)
MCH: 31.2 pg (ref 26.0–34.0)
MCHC: 31.7 g/dL — AB (ref 32.0–36.0)
MCV: 99 fL (ref 81–101)
Platelets: 104 10*3/uL — ABNORMAL LOW (ref 145–400)
RBC: 3.27 10*6/uL — ABNORMAL LOW (ref 3.70–5.32)
RDW: 18.7 % — AB (ref 11.1–15.7)
WBC: 24.3 10*3/uL — AB (ref 3.9–10.0)

## 2016-12-01 DIAGNOSIS — H353114 Nonexudative age-related macular degeneration, right eye, advanced atrophic with subfoveal involvement: Secondary | ICD-10-CM | POA: Diagnosis not present

## 2016-12-01 DIAGNOSIS — H353212 Exudative age-related macular degeneration, right eye, with inactive choroidal neovascularization: Secondary | ICD-10-CM | POA: Diagnosis not present

## 2016-12-01 DIAGNOSIS — H353123 Nonexudative age-related macular degeneration, left eye, advanced atrophic without subfoveal involvement: Secondary | ICD-10-CM | POA: Diagnosis not present

## 2016-12-01 DIAGNOSIS — H353222 Exudative age-related macular degeneration, left eye, with inactive choroidal neovascularization: Secondary | ICD-10-CM | POA: Diagnosis not present

## 2016-12-02 ENCOUNTER — Other Ambulatory Visit: Payer: Self-pay | Admitting: Hematology & Oncology

## 2016-12-02 DIAGNOSIS — C9110 Chronic lymphocytic leukemia of B-cell type not having achieved remission: Secondary | ICD-10-CM

## 2016-12-02 MED FILL — IMBRUVICA 420 MG TAB: 420 | 28 days supply | Qty: 28 | Fill #0

## 2016-12-14 DIAGNOSIS — H40012 Open angle with borderline findings, low risk, left eye: Secondary | ICD-10-CM | POA: Diagnosis not present

## 2016-12-14 DIAGNOSIS — H401113 Primary open-angle glaucoma, right eye, severe stage: Secondary | ICD-10-CM | POA: Diagnosis not present

## 2016-12-16 ENCOUNTER — Other Ambulatory Visit (HOSPITAL_BASED_OUTPATIENT_CLINIC_OR_DEPARTMENT_OTHER): Payer: Medicare HMO

## 2016-12-16 ENCOUNTER — Ambulatory Visit (HOSPITAL_BASED_OUTPATIENT_CLINIC_OR_DEPARTMENT_OTHER): Payer: Medicare HMO | Admitting: Hematology & Oncology

## 2016-12-16 VITALS — BP 147/67 | HR 67 | Temp 98.5°F | Resp 20 | Wt 174.0 lb

## 2016-12-16 DIAGNOSIS — C9110 Chronic lymphocytic leukemia of B-cell type not having achieved remission: Secondary | ICD-10-CM

## 2016-12-16 LAB — CBC WITH DIFFERENTIAL (CANCER CENTER ONLY)
BASO#: 0 10*3/uL (ref 0.0–0.2)
BASO%: 0 % (ref 0.0–2.0)
EOS ABS: 0.1 10*3/uL (ref 0.0–0.5)
EOS%: 0.3 % (ref 0.0–7.0)
HEMATOCRIT: 33.4 % — AB (ref 34.8–46.6)
HEMOGLOBIN: 10.7 g/dL — AB (ref 11.6–15.9)
LYMPH#: 24.2 10*3/uL — AB (ref 0.9–3.3)
LYMPH%: 83.4 % — ABNORMAL HIGH (ref 14.0–48.0)
MCH: 30.6 pg (ref 26.0–34.0)
MCHC: 32 g/dL (ref 32.0–36.0)
MCV: 95 fL (ref 81–101)
MONO#: 0.6 10*3/uL (ref 0.1–0.9)
MONO%: 2.1 % (ref 0.0–13.0)
NEUT%: 14.2 % — ABNORMAL LOW (ref 39.6–80.0)
NEUTROS ABS: 4.1 10*3/uL (ref 1.5–6.5)
Platelets: 115 10*3/uL — ABNORMAL LOW (ref 145–400)
RBC: 3.5 10*6/uL — ABNORMAL LOW (ref 3.70–5.32)
RDW: 18.9 % — ABNORMAL HIGH (ref 11.1–15.7)
WBC: 29 10*3/uL — ABNORMAL HIGH (ref 3.9–10.0)

## 2016-12-16 LAB — CMP (CANCER CENTER ONLY)
ALBUMIN: 3.6 g/dL (ref 3.3–5.5)
ALT(SGPT): 14 U/L (ref 10–47)
AST: 17 U/L (ref 11–38)
Alkaline Phosphatase: 56 U/L (ref 26–84)
BILIRUBIN TOTAL: 1.4 mg/dL (ref 0.20–1.60)
BUN, Bld: 16 mg/dL (ref 7–22)
CALCIUM: 9.8 mg/dL (ref 8.0–10.3)
CHLORIDE: 104 meq/L (ref 98–108)
CO2: 31 meq/L (ref 18–33)
Creat: 0.6 mg/dl (ref 0.6–1.2)
Glucose, Bld: 92 mg/dL (ref 73–118)
POTASSIUM: 4.7 meq/L (ref 3.3–4.7)
Sodium: 140 mEq/L (ref 128–145)
Total Protein: 5.8 g/dL — ABNORMAL LOW (ref 6.4–8.1)

## 2016-12-16 LAB — LACTATE DEHYDROGENASE: LDH: 175 U/L (ref 125–245)

## 2016-12-16 LAB — CHCC SATELLITE - SMEAR

## 2016-12-16 NOTE — Progress Notes (Signed)
Hematology and Oncology Follow Up Visit  CELISSE CIULLA [id number] [birth year] 83 y.o. 12/16/2016   Principle Diagnosis:  Chronic lymphocytic leukemia-stage C   Current Therapy:    Imbruvica 420mg  po q day     Interim History:  Ms.  Hartsough is back for followup. She is doing okay. She has a wound on her right forearm. This happened when she was fine to get at her pet period. His name is Gus. This is very funny.  Otherwise she is doing well. She's having no problem with the Imbruvica.  She's had no fever. She's had no bleeding. There's been no change in bowel or bladder habits. She's had no nausea or vomiting. She's had no pain. There's been no leg swelling.   Overall, her performance status is ECOG 2.   Medications:  Current Outpatient Prescriptions:  .  acetaZOLAMIDE (DIAMOX) 250 MG tablet, , Disp: , Rfl:  .  amLODipine (NORVASC) 5 MG tablet, Take 1 tablet (5 mg total) by mouth daily., Disp: 30 tablet, Rfl: 0 .  Ascorbic Acid (VITAMIN C PO), Take 1 tablet by mouth daily., Disp: , Rfl:  .  aspirin 325 MG tablet, Take 325 mg by mouth daily as needed for pain., Disp: , Rfl:  .  brimonidine (ALPHAGAN) 0.15 % ophthalmic solution, , Disp: , Rfl:  .  Cholecalciferol (VITAMIN D-3 PO), Take 1 capsule by mouth daily., Disp: , Rfl:  .  Cyanocobalamin (VITAMIN B 12 PO), Take by mouth every morning., Disp: , Rfl:  .  dorzolamide-timolol (COSOPT) 22.3-6.8 MG/ML ophthalmic solution, , Disp: , Rfl:  .  IMBRUVICA 420 MG TABS, TAKE 1 TABLET BY MOUTH DAILY, Disp: 30 tablet, Rfl: 1 .  latanoprost (XALATAN) 0.005 % ophthalmic solution, , Disp: , Rfl:  .  losartan (COZAAR) 50 MG tablet, Take 1 tablet (50 mg total) by mouth daily., Disp: 30 tablet, Rfl: 0 .  ofloxacin (OCUFLOX) 0.3 % ophthalmic solution, , Disp: , Rfl:  .  OVER THE COUNTER MEDICATION, Take 1 tablet by mouth daily. Over the counter eye health supplement, Disp: , Rfl:  .  pilocarpine (PILOCAR) 2 % ophthalmic solution, , Disp: , Rfl:  .   prednisoLONE acetate (PRED FORTE) 1 % ophthalmic suspension, , Disp: , Rfl:  .  timolol (TIMOPTIC) 0.5 % ophthalmic solution, , Disp: , Rfl:  .  VITAMIN E PO, Take 1 tablet by mouth daily., Disp: , Rfl:  .  zinc gluconate 50 MG tablet, Take 50 mg by mouth daily., Disp: , Rfl:   Allergies: No Known Allergies  Past Medical History.  Review of Systems: As stated in the interim history  Physical Exam:  weight is 174 lb (78.9 kg). Her oral temperature is 98.5 F (36.9 C). Her blood pressure is 147/67 (abnormal) and her pulse is 67. Her respiration is 20 and oxygen saturation is 96%.   I examined Mrs. Florido. Her exam is as stated below with the following corrections :  Somewhat obese white female in no obvious distress. Her head and neck exam shows no adenopathy in the neck. There is no ocular or oral lesions. She has no thyroid is palpable. Lungs are clear. Cardiac exam regular in rhythm with no murmurs rubs or bruits. Abdomen is soft. She has good bowel sounds. There is no fluid wave. There is no palpable abdominal mass. There is no palpable liver or spleen tip. Extremities shows no clubbing cyanosis or edema. She has a large dressing on the dorsal aspect of her right  forearm. She has some age-related osteoarthritic changes. Back exam shows some slight kyphosis. Axillary exam shows no right axillary adenopathy. I cannot palpate any left axillary adenopathy.  Lab Results  Component Value Date   WBC 29.0 (H) 12/16/2016   HGB 10.7 (L) 12/16/2016   HCT 33.4 (L) 12/16/2016   MCV 95 12/16/2016   PLT 115 Large platelets present (L) 12/16/2016     Chemistry      Component Value Date/Time   NA 140 12/16/2016 1406   NA 140 10/15/2016 1155   K 4.7 12/16/2016 1406   K 4.1 10/15/2016 1155   CL 104 12/16/2016 1406   CO2 31 12/16/2016 1406   CO2 27 10/15/2016 1155   BUN 16 12/16/2016 1406   BUN 18.6 10/15/2016 1155   CREATININE 0.6 12/16/2016 1406   CREATININE 0.7 10/15/2016 1155       Component Value Date/Time   CALCIUM 9.8 12/16/2016 1406   CALCIUM 10.2 10/15/2016 1155   ALKPHOS 56 12/16/2016 1406   ALKPHOS 46 10/15/2016 1155   AST 17 12/16/2016 1406   AST 12 10/15/2016 1155   ALT 14 12/16/2016 1406   ALT 8 10/15/2016 1155   BILITOT 1.40 12/16/2016 1406   BILITOT 2.09 (H) 10/15/2016 1155         Impression and Plan: Ms. Dorminey is an 83 year old white female with CLL. She has stage C disease.   I am a little surprised that her white cell count went up a little bit. Some of this might be from her having a wound on her right forearm. She injured her right forearm trying to get at their pet perished.  I think what is most important is that her hemoglobin continues to rise slowly.  I would like to get her back now in 6 weeks. I want to see her back before the holidays.   [name], MD 9/20/20183:29 PM

## 2016-12-21 DIAGNOSIS — C9110 Chronic lymphocytic leukemia of B-cell type not having achieved remission: Secondary | ICD-10-CM | POA: Diagnosis not present

## 2016-12-21 DIAGNOSIS — Z23 Encounter for immunization: Secondary | ICD-10-CM | POA: Diagnosis not present

## 2016-12-21 DIAGNOSIS — I10 Essential (primary) hypertension: Secondary | ICD-10-CM | POA: Diagnosis not present

## 2016-12-21 DIAGNOSIS — R7303 Prediabetes: Secondary | ICD-10-CM | POA: Diagnosis not present

## 2017-01-14 MED FILL — IMBRUVICA 420 MG TAB: 420 | 28 days supply | Qty: 28 | Fill #1

## 2017-01-25 DIAGNOSIS — Z961 Presence of intraocular lens: Secondary | ICD-10-CM | POA: Diagnosis not present

## 2017-01-25 DIAGNOSIS — H401113 Primary open-angle glaucoma, right eye, severe stage: Secondary | ICD-10-CM | POA: Diagnosis not present

## 2017-01-25 DIAGNOSIS — H40012 Open angle with borderline findings, low risk, left eye: Secondary | ICD-10-CM | POA: Diagnosis not present

## 2017-01-27 ENCOUNTER — Ambulatory Visit (HOSPITAL_BASED_OUTPATIENT_CLINIC_OR_DEPARTMENT_OTHER): Payer: Medicare HMO | Admitting: Hematology & Oncology

## 2017-01-27 ENCOUNTER — Other Ambulatory Visit (HOSPITAL_BASED_OUTPATIENT_CLINIC_OR_DEPARTMENT_OTHER): Payer: Medicare HMO

## 2017-01-27 VITALS — BP 178/71 | HR 70 | Temp 98.5°F | Resp 20 | Wt 175.0 lb

## 2017-01-27 DIAGNOSIS — C9110 Chronic lymphocytic leukemia of B-cell type not having achieved remission: Secondary | ICD-10-CM

## 2017-01-27 LAB — CBC WITH DIFFERENTIAL (CANCER CENTER ONLY)
BASO#: 0 10*3/uL (ref 0.0–0.2)
BASO%: 0 % (ref 0.0–2.0)
EOS ABS: 0.1 10*3/uL (ref 0.0–0.5)
EOS%: 0.3 % (ref 0.0–7.0)
HCT: 32.3 % — ABNORMAL LOW (ref 34.8–46.6)
HGB: 10.4 g/dL — ABNORMAL LOW (ref 11.6–15.9)
LYMPH#: 15.6 10*3/uL — ABNORMAL HIGH (ref 0.9–3.3)
LYMPH%: 76.7 % — AB (ref 14.0–48.0)
MCH: 28.7 pg (ref 26.0–34.0)
MCHC: 32.2 g/dL (ref 32.0–36.0)
MCV: 89 fL (ref 81–101)
MONO#: 0.7 10*3/uL (ref 0.1–0.9)
MONO%: 3.2 % (ref 0.0–13.0)
NEUT#: 4 10*3/uL (ref 1.5–6.5)
NEUT%: 19.8 % — AB (ref 39.6–80.0)
PLATELETS: 121 10*3/uL — AB (ref 145–400)
RBC: 3.63 10*6/uL — ABNORMAL LOW (ref 3.70–5.32)
RDW: 17.2 % — AB (ref 11.1–15.7)
WBC: 20.3 10*3/uL — ABNORMAL HIGH (ref 3.9–10.0)

## 2017-01-27 LAB — CMP (CANCER CENTER ONLY)
ALT(SGPT): 17 U/L (ref 10–47)
AST: 15 U/L (ref 11–38)
Albumin: 3.7 g/dL (ref 3.3–5.5)
Alkaline Phosphatase: 55 U/L (ref 26–84)
BUN: 15 mg/dL (ref 7–22)
CALCIUM: 9.8 mg/dL (ref 8.0–10.3)
CHLORIDE: 104 meq/L (ref 98–108)
CO2: 29 mEq/L (ref 18–33)
Creat: 0.6 mg/dl (ref 0.6–1.2)
GLUCOSE: 96 mg/dL (ref 73–118)
POTASSIUM: 4.6 meq/L (ref 3.3–4.7)
Sodium: 144 mEq/L (ref 128–145)
Total Bilirubin: 1.4 mg/dl (ref 0.20–1.60)
Total Protein: 5.8 g/dL — ABNORMAL LOW (ref 6.4–8.1)

## 2017-01-27 LAB — CHCC SATELLITE - SMEAR

## 2017-01-27 LAB — TECHNOLOGIST REVIEW CHCC SATELLITE

## 2017-01-27 NOTE — Progress Notes (Signed)
Hematology and Oncology Follow Up Visit  [name] [id number] Dec 04, 1928 83 y.o. 01/27/2017   Principle Diagnosis:  Chronic lymphocytic leukemia-stage C   Current Therapy:    Imbruvica 420mg  po q day - started on 07/2016     Interim History:  [name] is back for followup. She is doing okay. She has been pretty active. She has had no problems with cough or shortness of breath. She's had no palpable lymph nodes. She's had no fever. She's had no bleeding. She's had no change in bowel or bladder habits.  Her white cell count has been coming down nicely.  She has had no nausea with the Imbruvica.   Overall, her performance status is ECOG 2.   Medications:  Current Outpatient Prescriptions:  .  acetaZOLAMIDE (DIAMOX) 250 MG tablet, , Disp: , Rfl:  .  amLODipine (NORVASC) 5 MG tablet, Take 1 tablet (5 mg total) by mouth daily., Disp: 30 tablet, Rfl: 0 .  Ascorbic Acid (VITAMIN C PO), Take 1 tablet by mouth daily., Disp: , Rfl:  .  aspirin 325 MG tablet, Take 325 mg by mouth daily as needed for pain., Disp: , Rfl:  .  brimonidine (ALPHAGAN) 0.15 % ophthalmic solution, , Disp: , Rfl:  .  Cholecalciferol (VITAMIN D-3 PO), Take 1 capsule by mouth daily., Disp: , Rfl:  .  Cyanocobalamin (VITAMIN B 12 PO), Take by mouth every morning., Disp: , Rfl:  .  dorzolamide-timolol (COSOPT) 22.3-6.8 MG/ML ophthalmic solution, , Disp: , Rfl:  .  IMBRUVICA 420 MG TABS, TAKE 1 TABLET BY MOUTH DAILY, Disp: 30 tablet, Rfl: 1 .  latanoprost (XALATAN) 0.005 % ophthalmic solution, , Disp: , Rfl:  .  losartan (COZAAR) 50 MG tablet, Take 1 tablet (50 mg total) by mouth daily., Disp: 30 tablet, Rfl: 0 .  ofloxacin (OCUFLOX) 0.3 % ophthalmic solution, , Disp: , Rfl:  .  OVER THE COUNTER MEDICATION, Take 1 tablet by mouth daily. Over the counter eye health supplement, Disp: , Rfl:  .  pilocarpine (PILOCAR) 2 % ophthalmic solution, , Disp: , Rfl:  .  prednisoLONE acetate (PRED FORTE) 1 % ophthalmic  suspension, , Disp: , Rfl:  .  timolol (TIMOPTIC) 0.5 % ophthalmic solution, , Disp: , Rfl:  .  VITAMIN E PO, Take 1 tablet by mouth daily., Disp: , Rfl:  .  zinc gluconate 50 MG tablet, Take 50 mg by mouth daily., Disp: , Rfl:   Allergies: No Known Allergies  Past Medical History.  Review of Systems: As stated in the interim history  Physical Exam:  weight is 175 lb (79.4 kg). Her oral temperature is 98.5 F (36.9 C). Her blood pressure is 178/71 (abnormal) and her pulse is 70. Her respiration is 20 and oxygen saturation is 86% (abnormal).   I examined [name] today. Her exam is noted below:  Somewhat obese white female in no obvious distress. Her head and neck exam shows no adenopathy in the neck. There is no ocular or oral lesions. She has no thyroid is palpable. Lungs are clear. Cardiac exam regular in rhythm with occasional extra beat. There are no murmurs rubs or bruits. Abdomen is soft. She has good bowel sounds. There is no fluid wave. There is no palpable abdominal mass. There is no palpable liver or spleen tip. Extremities shows no clubbing cyanosis or edema. She has a large dressing on the dorsal aspect of her right forearm. She has some age-related osteoarthritic changes. Back exam shows some  slight kyphosis. Axillary exam shows no right axillary adenopathy. I cannot palpate any left axillary adenopathy.  Lab Results  Component Value Date   WBC 20.3 (H) 01/27/2017   HGB 10.4 (L) 01/27/2017   HCT 32.3 (L) 01/27/2017   MCV 89 01/27/2017   PLT 121 (L) 01/27/2017     Chemistry      Component Value Date/Time   NA 144 01/27/2017 1431   NA 140 10/15/2016 1155   K 4.6 01/27/2017 1431   K 4.1 10/15/2016 1155   CL 104 01/27/2017 1431   CO2 29 01/27/2017 1431   CO2 27 10/15/2016 1155   BUN 15 01/27/2017 1431   BUN 18.6 10/15/2016 1155   CREATININE 0.6 01/27/2017 1431   CREATININE 0.7 10/15/2016 1155      Component Value Date/Time   CALCIUM 9.8 01/27/2017 1431    CALCIUM 10.2 10/15/2016 1155   ALKPHOS 55 01/27/2017 1431   ALKPHOS 46 10/15/2016 1155   AST 15 01/27/2017 1431   AST 12 10/15/2016 1155   ALT 17 01/27/2017 1431   ALT 8 10/15/2016 1155   BILITOT 1.40 01/27/2017 1431   BILITOT 2.09 (H) 10/15/2016 1155      We did an EKG. This showed a normal sinus rhythm. She had a right bundle-branch block. There was some sinus arrhythmia.   Impression and Plan: Ms. Saunders is an 83 year old white female with CLL. She has stage C disease.   Again, I think her white cell count is trending down quite nicely. I'm very happy about this.  We will go ahead and plan to get her back in 6 more weeks. I want to make sure that everything is okay for the holidays.  She knows that she can always give [place] a call if she has any problems.  [name], MD 11/1/20183:07 PM

## 2017-01-28 LAB — LACTATE DEHYDROGENASE: LDH: 162 U/L (ref 125–245)

## 2017-02-02 ENCOUNTER — Telehealth: Payer: Self-pay | Admitting: *Deleted

## 2017-02-02 NOTE — Telephone Encounter (Signed)
Patient is c/o constipation. She has tried a few over the counter medications including stool softeners and mag citrate. She doesn't feel like its any better.  Instructed patient to start miralax BID until BM and then continue daily. Stop if she develops diarrhea. She understands instructions. Confirmed with [facility].  She will call back Friday if there is no success with her miralax.

## 2017-02-08 ENCOUNTER — Other Ambulatory Visit: Payer: Self-pay | Admitting: Hematology & Oncology

## 2017-02-08 DIAGNOSIS — C9110 Chronic lymphocytic leukemia of B-cell type not having achieved remission: Secondary | ICD-10-CM

## 2017-02-08 MED FILL — IMBRUVICA 420 MG TAB: 420 | 28 days supply | Qty: 28 | Fill #0

## 2017-03-03 ENCOUNTER — Telehealth: Payer: Self-pay | Admitting: Hematology & Oncology

## 2017-03-03 NOTE — Telephone Encounter (Signed)
Oral Oncology Patient Advocate Encounter  Was successful in securing patient an $ $3500.00 grant from [place] to provide copayment coverage for her CLL medication.  This will keep the out of pocket expense at $0.    I have spoken with the patient.    The billing information is as follows and has been shared with [place].   Member ID:  Group ID:  RxBinDates of Eligibility:     through    [place] Patient Advocate [phone] 03/03/2017 8:51 AM

## 2017-03-03 NOTE — Telephone Encounter (Signed)
Oral Oncology Patient Advocate Encounter  [employer] Co-Pay Relief Program diagnosis verification form was completed and faxed to [place] at [phone]  This is the last step from the office needed to secure continued patient access to funding.   [place] Patient Advocate [phone] 03/03/2017 8:52 AM

## 2017-03-08 MED FILL — IMBRUVICA 420 MG TAB: 420 | 28 days supply | Qty: 28 | Fill #1

## 2017-03-10 ENCOUNTER — Other Ambulatory Visit (HOSPITAL_BASED_OUTPATIENT_CLINIC_OR_DEPARTMENT_OTHER): Payer: Medicare HMO

## 2017-03-10 ENCOUNTER — Other Ambulatory Visit: Payer: Self-pay

## 2017-03-10 ENCOUNTER — Ambulatory Visit (HOSPITAL_BASED_OUTPATIENT_CLINIC_OR_DEPARTMENT_OTHER): Payer: Medicare HMO | Admitting: Hematology & Oncology

## 2017-03-10 VITALS — BP 182/85 | HR 68 | Temp 98.0°F | Resp 16 | Wt 172.0 lb

## 2017-03-10 DIAGNOSIS — C9110 Chronic lymphocytic leukemia of B-cell type not having achieved remission: Secondary | ICD-10-CM

## 2017-03-10 DIAGNOSIS — C9190 Lymphoid leukemia, unspecified not having achieved remission: Secondary | ICD-10-CM | POA: Diagnosis not present

## 2017-03-10 LAB — CBC WITH DIFFERENTIAL (CANCER CENTER ONLY)
BASO#: 0 10*3/uL (ref 0.0–0.2)
BASO%: 0 % (ref 0.0–2.0)
EOS%: 0.5 % (ref 0.0–7.0)
Eosinophils Absolute: 0.1 10*3/uL (ref 0.0–0.5)
HCT: 33.6 % — ABNORMAL LOW (ref 34.8–46.6)
HGB: 10.4 g/dL — ABNORMAL LOW (ref 11.6–15.9)
LYMPH#: 12.1 10*3/uL — ABNORMAL HIGH (ref 0.9–3.3)
LYMPH%: 77.4 % — AB (ref 14.0–48.0)
MCH: 26.7 pg (ref 26.0–34.0)
MCHC: 31 g/dL — AB (ref 32.0–36.0)
MCV: 86 fL (ref 81–101)
MONO#: 0.6 10*3/uL (ref 0.1–0.9)
MONO%: 3.9 % (ref 0.0–13.0)
NEUT#: 2.8 10*3/uL (ref 1.5–6.5)
NEUT%: 18.2 % — AB (ref 39.6–80.0)
RBC: 3.9 10*6/uL (ref 3.70–5.32)
RDW: 18.9 % — AB (ref 11.1–15.7)
WBC: 15.6 10*3/uL — ABNORMAL HIGH (ref 3.9–10.0)

## 2017-03-10 LAB — TECHNOLOGIST REVIEW CHCC SATELLITE

## 2017-03-10 LAB — CMP (CANCER CENTER ONLY)
ALK PHOS: 55 U/L (ref 26–84)
ALT: 17 U/L (ref 10–47)
AST: 15 U/L (ref 11–38)
Albumin: 3.8 g/dL (ref 3.3–5.5)
BUN: 12 mg/dL (ref 7–22)
CO2: 31 meq/L (ref 18–33)
Calcium: 9.9 mg/dL (ref 8.0–10.3)
Chloride: 103 mEq/L (ref 98–108)
Creat: 0.7 mg/dl (ref 0.6–1.2)
Glucose, Bld: 93 mg/dL (ref 73–118)
POTASSIUM: 4.5 meq/L (ref 3.3–4.7)
Sodium: 143 mEq/L (ref 128–145)
Total Bilirubin: 1.3 mg/dl (ref 0.20–1.60)
Total Protein: 5.8 g/dL — ABNORMAL LOW (ref 6.4–8.1)

## 2017-03-10 LAB — LACTATE DEHYDROGENASE: LDH: 155 U/L (ref 125–245)

## 2017-03-10 LAB — CHCC SATELLITE - SMEAR
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
## 2017-03-11 LAB — IGG, IGA, IGM
IGA/IMMUNOGLOBULIN A, SERUM: 47 mg/dL — AB (ref 64–422)
IGG (IMMUNOGLOBIN G), SERUM: 590 mg/dL — AB (ref 700–1600)
IgM, Qn, Serum: 120 mg/dL (ref 26–217)

## 2017-03-30 ENCOUNTER — Other Ambulatory Visit: Payer: Self-pay | Admitting: Hematology & Oncology

## 2017-03-30 DIAGNOSIS — C9110 Chronic lymphocytic leukemia of B-cell type not having achieved remission: Secondary | ICD-10-CM

## 2017-03-31 MED FILL — IMBRUVICA 420 MG TAB: 420 | 28 days supply | Qty: 28 | Fill #0

## 2017-04-01 ENCOUNTER — Telehealth: Payer: Self-pay | Admitting: Hematology & Oncology

## 2017-04-01 NOTE — Telephone Encounter (Signed)
Oral Oncology Patient Advocate Encounter  Was successful in securing patient a yearly grant from [place] to provide copayment coverage for her Imbruvica.  This will keep the out of pocket expense at $0.    I have spoken with the patient.    The billing information is as follows and has been shared with [place].   Member ID: [id number] Group ID: [id number] RxBin: [id number] Dates of Eligibility: 03/31/2017 through 03/31/2018   [place] Patient Advocate [phone] 04/01/2017 1:34 PM

## 2017-04-12 DIAGNOSIS — H3562 Retinal hemorrhage, left eye: Secondary | ICD-10-CM | POA: Diagnosis not present

## 2017-04-12 DIAGNOSIS — H353221 Exudative age-related macular degeneration, left eye, with active choroidal neovascularization: Secondary | ICD-10-CM | POA: Diagnosis not present

## 2017-04-12 DIAGNOSIS — H353212 Exudative age-related macular degeneration, right eye, with inactive choroidal neovascularization: Secondary | ICD-10-CM | POA: Diagnosis not present

## 2017-04-12 DIAGNOSIS — H353114 Nonexudative age-related macular degeneration, right eye, advanced atrophic with subfoveal involvement: Secondary | ICD-10-CM | POA: Diagnosis not present

## 2017-04-15 DIAGNOSIS — H5712 Ocular pain, left eye: Secondary | ICD-10-CM | POA: Diagnosis not present

## 2017-04-15 DIAGNOSIS — H3562 Retinal hemorrhage, left eye: Secondary | ICD-10-CM | POA: Diagnosis not present

## 2017-04-15 DIAGNOSIS — H353221 Exudative age-related macular degeneration, left eye, with active choroidal neovascularization: Secondary | ICD-10-CM | POA: Diagnosis not present

## 2017-04-15 DIAGNOSIS — Z711 Person with feared health complaint in whom no diagnosis is made: Secondary | ICD-10-CM | POA: Diagnosis not present

## 2017-04-19 ENCOUNTER — Telehealth: Payer: Self-pay | Admitting: Student-PharmD

## 2017-04-19 NOTE — Telephone Encounter (Signed)
Oral Chemotherapy Pharmacist Encounter  Follow-Up Form  Called patient today to follow up regarding patient's oral chemotherapy medication: Imbruvica (ibrutinib)  Original Start date of oral chemotherapy: 07/30/16  Pt reports 0 tablets/doses of Imbruvica missed in the last 4 weeks.   Pt reports the following side effects: None  Recent labs reviewed: CBC from 03/10/17  New medications?: None  Other Issues: N/A  Patient knows to call the office with questions or concerns. Oral Oncology Clinic will continue to follow.  Barkley Boards, PharmD Candidate   04/19/2017 3:32 PM

## 2017-04-22 ENCOUNTER — Emergency Department (HOSPITAL_BASED_OUTPATIENT_CLINIC_OR_DEPARTMENT_OTHER): Payer: Medicare HMO

## 2017-04-22 ENCOUNTER — Other Ambulatory Visit: Payer: Self-pay

## 2017-04-22 ENCOUNTER — Encounter (HOSPITAL_BASED_OUTPATIENT_CLINIC_OR_DEPARTMENT_OTHER): Payer: Self-pay | Admitting: Emergency Medicine

## 2017-04-22 ENCOUNTER — Inpatient Hospital Stay (HOSPITAL_BASED_OUTPATIENT_CLINIC_OR_DEPARTMENT_OTHER)
Admission: EM | Admit: 2017-04-22 | Discharge: 2017-04-27 | DRG: 291 | Disposition: A | Discharge status: Home Health | Payer: Medicare HMO | Attending: Internal Medicine | Admitting: Internal Medicine

## 2017-04-22 DIAGNOSIS — M7989 Other specified soft tissue disorders: Secondary | ICD-10-CM

## 2017-04-22 DIAGNOSIS — J310 Chronic rhinitis: Secondary | ICD-10-CM | POA: Diagnosis present

## 2017-04-22 DIAGNOSIS — D72820 Lymphocytosis (symptomatic): Secondary | ICD-10-CM | POA: Diagnosis not present

## 2017-04-22 DIAGNOSIS — K224 Dyskinesia of esophagus: Secondary | ICD-10-CM | POA: Diagnosis present

## 2017-04-22 DIAGNOSIS — Z9049 Acquired absence of other specified parts of digestive tract: Secondary | ICD-10-CM | POA: Diagnosis not present

## 2017-04-22 DIAGNOSIS — R1312 Dysphagia, oropharyngeal phase: Secondary | ICD-10-CM | POA: Diagnosis not present

## 2017-04-22 DIAGNOSIS — C9190 Lymphoid leukemia, unspecified not having achieved remission: Secondary | ICD-10-CM | POA: Diagnosis not present

## 2017-04-22 DIAGNOSIS — I5031 Acute diastolic (congestive) heart failure: Secondary | ICD-10-CM | POA: Diagnosis not present

## 2017-04-22 DIAGNOSIS — I10 Essential (primary) hypertension: Secondary | ICD-10-CM

## 2017-04-22 DIAGNOSIS — I110 Hypertensive heart disease with heart failure: Principal | ICD-10-CM | POA: Diagnosis present

## 2017-04-22 DIAGNOSIS — Z9221 Personal history of antineoplastic chemotherapy: Secondary | ICD-10-CM

## 2017-04-22 DIAGNOSIS — D649 Anemia, unspecified: Secondary | ICD-10-CM | POA: Diagnosis present

## 2017-04-22 DIAGNOSIS — K228 Other specified diseases of esophagus: Secondary | ICD-10-CM | POA: Diagnosis present

## 2017-04-22 DIAGNOSIS — J9601 Acute respiratory failure with hypoxia: Secondary | ICD-10-CM | POA: Diagnosis not present

## 2017-04-22 DIAGNOSIS — D696 Thrombocytopenia, unspecified: Secondary | ICD-10-CM | POA: Diagnosis not present

## 2017-04-22 DIAGNOSIS — R0902 Hypoxemia: Secondary | ICD-10-CM | POA: Diagnosis not present

## 2017-04-22 DIAGNOSIS — E876 Hypokalemia: Secondary | ICD-10-CM | POA: Diagnosis not present

## 2017-04-22 DIAGNOSIS — I5033 Acute on chronic diastolic (congestive) heart failure: Secondary | ICD-10-CM | POA: Diagnosis not present

## 2017-04-22 DIAGNOSIS — J9600 Acute respiratory failure, unspecified whether with hypoxia or hypercapnia: Secondary | ICD-10-CM | POA: Diagnosis present

## 2017-04-22 DIAGNOSIS — I4510 Unspecified right bundle-branch block: Secondary | ICD-10-CM | POA: Diagnosis present

## 2017-04-22 DIAGNOSIS — I350 Nonrheumatic aortic (valve) stenosis: Secondary | ICD-10-CM | POA: Diagnosis not present

## 2017-04-22 DIAGNOSIS — R0602 Shortness of breath: Secondary | ICD-10-CM | POA: Diagnosis not present

## 2017-04-22 DIAGNOSIS — Z87891 Personal history of nicotine dependence: Secondary | ICD-10-CM

## 2017-04-22 DIAGNOSIS — J329 Chronic sinusitis, unspecified: Secondary | ICD-10-CM | POA: Diagnosis present

## 2017-04-22 DIAGNOSIS — I509 Heart failure, unspecified: Secondary | ICD-10-CM

## 2017-04-22 DIAGNOSIS — R1310 Dysphagia, unspecified: Secondary | ICD-10-CM | POA: Diagnosis not present

## 2017-04-22 DIAGNOSIS — E871 Hypo-osmolality and hyponatremia: Secondary | ICD-10-CM | POA: Diagnosis not present

## 2017-04-22 DIAGNOSIS — I2721 Secondary pulmonary arterial hypertension: Secondary | ICD-10-CM | POA: Diagnosis present

## 2017-04-22 DIAGNOSIS — D6959 Other secondary thrombocytopenia: Secondary | ICD-10-CM | POA: Diagnosis not present

## 2017-04-22 DIAGNOSIS — H409 Unspecified glaucoma: Secondary | ICD-10-CM | POA: Diagnosis not present

## 2017-04-22 DIAGNOSIS — I272 Pulmonary hypertension, unspecified: Secondary | ICD-10-CM | POA: Diagnosis not present

## 2017-04-22 DIAGNOSIS — J90 Pleural effusion, not elsewhere classified: Secondary | ICD-10-CM

## 2017-04-22 DIAGNOSIS — I2510 Atherosclerotic heart disease of native coronary artery without angina pectoris: Secondary | ICD-10-CM | POA: Diagnosis present

## 2017-04-22 DIAGNOSIS — I313 Pericardial effusion (noninflammatory): Secondary | ICD-10-CM | POA: Diagnosis present

## 2017-04-22 DIAGNOSIS — E878 Other disorders of electrolyte and fluid balance, not elsewhere classified: Secondary | ICD-10-CM | POA: Diagnosis not present

## 2017-04-22 DIAGNOSIS — Z96652 Presence of left artificial knee joint: Secondary | ICD-10-CM | POA: Diagnosis present

## 2017-04-22 DIAGNOSIS — C9110 Chronic lymphocytic leukemia of B-cell type not having achieved remission: Secondary | ICD-10-CM | POA: Diagnosis present

## 2017-04-22 DIAGNOSIS — R9389 Abnormal findings on diagnostic imaging of other specified body structures: Secondary | ICD-10-CM | POA: Diagnosis not present

## 2017-04-22 DIAGNOSIS — R600 Localized edema: Secondary | ICD-10-CM | POA: Diagnosis not present

## 2017-04-22 DIAGNOSIS — Z79899 Other long term (current) drug therapy: Secondary | ICD-10-CM | POA: Diagnosis not present

## 2017-04-22 DIAGNOSIS — Z85038 Personal history of other malignant neoplasm of large intestine: Secondary | ICD-10-CM

## 2017-04-22 HISTORY — DX: Heart failure, unspecified: I50.9

## 2017-04-22 LAB — CBC
HEMATOCRIT: 34.2 % — AB (ref 36.0–46.0)
HEMOGLOBIN: 10.4 g/dL — AB (ref 12.0–15.0)
MCH: 24.5 pg — ABNORMAL LOW (ref 26.0–34.0)
MCHC: 30.4 g/dL (ref 30.0–36.0)
MCV: 80.5 fL (ref 78.0–100.0)
Platelets: 97 10*3/uL — ABNORMAL LOW (ref 150–400)
RBC: 4.25 MIL/uL (ref 3.87–5.11)
RDW: 16.6 % — AB (ref 11.5–15.5)
WBC: 15 10*3/uL — ABNORMAL HIGH (ref 4.0–10.5)

## 2017-04-22 LAB — TROPONIN I: Troponin I: <0.03 ng/mL (ref <0.03)

## 2017-04-22 LAB — HEPATIC FUNCTION PANEL
ALK PHOS: 47 U/L (ref 38–126)
ALT: 13 U/L — AB (ref 14–54)
AST: 14 U/L — AB (ref 15–41)
Albumin: 4 g/dL (ref 3.5–5.0)
BILIRUBIN INDIRECT: 0.9 mg/dL (ref 0.3–0.9)
Bilirubin, Direct: 0.2 mg/dL (ref 0.1–0.5)
Total Bilirubin: 1.1 mg/dL (ref 0.3–1.2)
Total Protein: 6.1 g/dL — ABNORMAL LOW (ref 6.5–8.1)

## 2017-04-22 LAB — BASIC METABOLIC PANEL
ANION GAP: 5 (ref 5–15)
BUN: 15 mg/dL (ref 6–20)
CHLORIDE: 103 mmol/L (ref 101–111)
CO2: 28 mmol/L (ref 22–32)
Calcium: 10 mg/dL (ref 8.9–10.3)
Creatinine, Ser: 0.65 mg/dL (ref 0.44–1.00)
GFR calc Af Amer: >60 mL/min (ref >60)
GFR calc non Af Amer: >60 mL/min (ref >60)
GLUCOSE: 92 mg/dL (ref 65–99)
POTASSIUM: 3.9 mmol/L (ref 3.5–5.1)
Sodium: 136 mmol/L (ref 135–145)

## 2017-04-22 LAB — BRAIN NATRIURETIC PEPTIDE: B Natriuretic Peptide: 150.9 pg/mL — ABNORMAL HIGH (ref 0.0–100.0)

## 2017-04-22 MED ORDER — NITROGLYCERIN 2 % TD OINT
1.0000 [in_us] | TOPICAL_OINTMENT | Freq: Four times a day (QID) | TRANSDERMAL | Status: DC
  Administered 2017-04-22: 1 [in_us] via TOPICAL
  Filled 2017-04-22: qty 1

## 2017-04-22 MED ORDER — FUROSEMIDE 10 MG/ML IJ SOLN
40.0000 mg | Freq: Once | INTRAMUSCULAR | Status: AC
  Administered 2017-04-22: 40 mg via INTRAVENOUS
  Filled 2017-04-22: qty 4

## 2017-04-22 MED ORDER — IOPAMIDOL (ISOVUE-370) INJECTION 76%
100.0000 mL | Freq: Once | INTRAVENOUS | Status: AC | PRN
  Administered 2017-04-22: 100 mL via INTRAVENOUS

## 2017-04-22 MED ORDER — IPRATROPIUM-ALBUTEROL 0.5-2.5 (3) MG/3ML IN SOLN
3.0000 mL | Freq: Four times a day (QID) | RESPIRATORY_TRACT | Status: DC
  Administered 2017-04-22: 3 mL via RESPIRATORY_TRACT
  Filled 2017-04-22 (×2): qty 3

## 2017-04-22 MED ORDER — AMLODIPINE BESYLATE 5 MG PO TABS
5.0000 mg | ORAL_TABLET | Freq: Once | ORAL | Status: AC
  Administered 2017-04-22: 5 mg via ORAL
  Filled 2017-04-22: qty 1

## 2017-04-22 NOTE — ED Notes (Signed)
RT at bedside.

## 2017-04-22 NOTE — ED Notes (Signed)
Arleen (daughter) [phone]

## 2017-04-22 NOTE — ED Provider Notes (Signed)
[facility] EMERGENCY DEPARTMENT Provider Note   CSN: [id number] Arrival date & time: 04/22/17  1518     History   Chief Complaint Chief Complaint  Patient presents with  . Shortness of Breath    HPI Ledonna Dormer Denk is a 84 y.o. female.  HPI Patient was an equal walk-in clinic and found to be hypoxic.  Patient reports that she has shortness of breath but it has been worse over the past several days.  Oxygen saturations room air were identified to be 86%.  Identified some pleural effusions on chest x-ray and referred the patient to the emergency department for further diagnostic evaluation.  Patient reports that she is treated with chemotherapy for CLL by Dr. Marin Olp.  She has been taking Imbruvica.  She reports a lot of her symptoms start with nasal congestion, postnasal drainage and feeling of mucus building up in her throat and airway.  She reports then she will start having coughing but finds it often difficult to actually get anything up.  This is been a fairly persistent problem.  No documented fevers.  Her daughter noted today that her right leg seemed more swollen and they had not previously noted this. Past Medical History:  Diagnosis Date  . Arthritis   . CLL (chronic lymphocytic leukemia) ([place]) 11/10/2012  . Hypertension   . Knee pain, left   . Total knee replacement status     Patient Active Problem List   Diagnosis Date Noted  . Chest pain 12/30/2012  . Hypertensive urgency 12/30/2012  . CLL (chronic lymphocytic leukemia) ([place]) 11/10/2012    Past Surgical History:  Procedure Laterality Date  . APPENDECTOMY    . carpel tunnel surgery     bilateral  . CHOLECYSTECTOMY    . COLON SURGERY    . HERNIA REPAIR     left  . JOINT REPLACEMENT     Bilateral    OB History    No data available       Home Medications    Prior to Admission medications   Medication Sig Start Date End Date Taking? Authorizing Provider  acetaZOLAMIDE (DIAMOX) 250 MG  tablet  08/28/16   [provider]  amLODipine (NORVASC) 5 MG tablet Take 1 tablet (5 mg total) by mouth daily. 12/31/12   Geradine Girt, DO  Ascorbic Acid (VITAMIN C PO) Take 1 tablet by mouth daily.    [provider]  aspirin 325 MG tablet Take 325 mg by mouth daily as needed for pain.    [provider]  brimonidine (ALPHAGAN) 0.15 % ophthalmic solution  06/10/16   [provider]  Cholecalciferol (VITAMIN D-3 PO) Take 1 capsule by mouth daily.    [provider]  Cyanocobalamin (VITAMIN B 12 PO) Take by mouth every morning.    [provider]  dorzolamide-timolol (COSOPT) 22.3-6.8 MG/ML ophthalmic solution  12/23/14   [provider]  IMBRUVICA 420 MG TABS TAKE 1 TABLET BY MOUTH DAILY 03/30/17   [name], MD  latanoprost (XALATAN) 0.005 % ophthalmic solution  06/24/16   [provider]  losartan (COZAAR) 50 MG tablet Take 1 tablet (50 mg total) by mouth daily. 12/31/12   Geradine Girt, DO  ofloxacin (OCUFLOX) 0.3 % ophthalmic solution  08/24/16   [provider]  OVER THE COUNTER MEDICATION Take 1 tablet by mouth daily. Over the counter eye health supplement    [provider]  pilocarpine (PILOCAR) 2 % ophthalmic solution  06/24/16  [provider]  prednisoLONE acetate (PRED FORTE) 1 % ophthalmic suspension  08/24/16   [provider]  timolol (TIMOPTIC) 0.5 % ophthalmic solution  06/24/16   [provider]  VITAMIN E PO Take 1 tablet by mouth daily.    [provider]  zinc gluconate 50 MG tablet Take 50 mg by mouth daily.    [provider]    Family History No family history on file.  Social History Social History   Tobacco Use  . Smoking status: Former Smoker    Packs/day: 1.00    Years: 28.00    Pack years: 28.00    Types: Cigarettes    Start date: 02/15/1946    Last attempt to quit: 12/16/1973    Years since quitting: 43.3  . Smokeless  tobacco: Never Used  . Tobacco comment: quit 40 years ago  Substance Use Topics  . Alcohol use: Yes    Alcohol/week: 0.0 oz  . Drug use: Not on file     Allergies   Patient has no known allergies.   Review of Systems Review of Systems 10 Systems reviewed and are negative for acute change except as noted in the HPI.   Physical Exam Updated Vital Signs BP (!) 171/90   Pulse 70   Temp 98 F (36.7 C) (Oral)   Resp 19   Ht 5\' 3"  (1.6 m)   Wt 79.8 kg (176 lb)   SpO2 100%   BMI 31.18 kg/m   Physical Exam  Constitutional: She is oriented to person, place, and time.  She is alert and interactive.  Mild increased work of breathing at rest.  Nontoxic.  HENT:  Head: Normocephalic and atraumatic.  Patient is edentulous.  She wears dentures.  With dentures removed mucous membranes are pink and moist.  Posterior oropharynx is widely patent.  No visible mucus or drainage from the posterior nasopharynx.  Eyes: EOM are normal.  Neck: Neck supple.  Cardiovascular: Normal rate.  Pulmonary/Chest:  Mild increased work of breathing.  Decreased breath sounds at the bases with basilar crackle.  Abdominal: Soft. She exhibits no distension. There is no tenderness. There is no guarding.  Musculoskeletal:  2+ edema left lower extremity 1+ edema right lower extremity.  Neurological: She is alert and oriented to person, place, and time. She exhibits normal muscle tone. Coordination normal.  Skin: Skin is warm and dry.  Psychiatric: She has a normal mood and affect.     ED Treatments / Results  Labs (all labs ordered are listed, but only abnormal results are displayed) Labs Reviewed  CBC - Abnormal; Notable for the following components:      Result Value   WBC 15.0 (*)    Hemoglobin 10.4 (*)    HCT 34.2 (*)    MCH 24.5 (*)    RDW 16.6 (*)    Platelets 97 (*)    All other components within normal limits  BRAIN NATRIURETIC PEPTIDE - Abnormal; Notable for the following components:   B  Natriuretic Peptide 150.9 (*)    All other components within normal limits  HEPATIC FUNCTION PANEL - Abnormal; Notable for the following components:   Total Protein 6.1 (*)    AST 14 (*)    ALT 13 (*)    All other components within normal limits  BASIC METABOLIC PANEL  TROPONIN I    EKG  EKG Interpretation  Date/Time:  Friday April 22 2017 15:55:11 EST Ventricular Rate:  79 PR Interval:  QRS Duration: 141 QT Interval:  398 QTC Calculation: 457 R Axis:   74 Text Interpretation:  Sinus rhythm Atrial premature complexes in couplets Right bundle branch block old RBBB. No sig change from previous Confirmed by Charlesetta Shanks [phone]) on 04/22/2017 8:16:31 PM       Radiology Dg Chest 2 View  Result Date: 04/22/2017 CLINICAL DATA:  84 year old with decreased oxygen saturations. Shortness of breath. EXAM: CHEST  2 VIEW COMPARISON:  04/22/2017 chest radiograph and CT. FINDINGS: Stable cardiac enlargement. Persistent bibasilar densities that are compatible with volume loss and small effusions. Prominent central vascular structures. Atherosclerotic calcifications at the aortic arch. IMPRESSION: Persistent bibasilar chest densities. Findings are suggestive for atelectasis and small effusions. Minimal change from the recent comparison examination. Vascular congestion and cannot exclude mild pulmonary edema. Electronically Signed   By: Markus Daft M.D.   On: 04/22/2017 17:24   Ct Angio Chest Pe W/cm &/or Wo Cm  Result Date: 04/22/2017 CLINICAL DATA:  Decreasing O2 sats. Shortness of breath and fatigue. History of CLL. EXAM: CT ANGIOGRAPHY CHEST WITH CONTRAST TECHNIQUE: Multidetector CT imaging of the chest was performed using the standard protocol during bolus administration of intravenous contrast. Multiplanar CT image reconstructions and MIPs were obtained to evaluate the vascular anatomy. CONTRAST:  148mL ISOVUE-370 IOPAMIDOL (ISOVUE-370) INJECTION 76% COMPARISON:  Standard CT chest 01/02/2009  FINDINGS: Cardiovascular: The heart is enlarged. Small pericardial effusion evident. Coronary artery calcification is evident. Atherosclerotic calcification is noted in the wall of the thoracic aorta. No filling defect in the opacified pulmonary arteries to suggest the presence of an acute pulmonary embolus. Pulmonary arterial enlargement suggests pulmonary arterial hypertension. Mediastinum/Nodes: No mediastinal lymphadenopathy. There is no hilar lymphadenopathy. The esophagus has normal imaging features. There is no axillary lymphadenopathy. Lungs/Pleura: Interlobular septal thickening is associated with bibasilar collapse/consolidation and small to moderate bilateral pleural effusions. Upper Abdomen: Unremarkable. Musculoskeletal: Bone windows reveal no worrisome lytic or sclerotic osseous lesions. Review of the MIP images confirms the above findings. IMPRESSION: 1. No CT evidence for acute pulmonary embolus. 2. Pulmonary arterial enlargement suggests pulmonary arterial hypertension. 3. Cardiomegaly with small pericardial effusion. 4. Bibasilar collapse/consolidation with small to moderate bilateral pleural effusions. 5. Coronary artery and Aortic Atherosclerois (ICD10-170.0) Electronically Signed   By: Misty Stanley M.D.   On: 04/22/2017 17:26   US Venous Img Lower Unilateral Right  Result Date: 04/22/2017 CLINICAL DATA:  84 year old female with right lower extremity pain and swelling. EXAM: RIGHT LOWER EXTREMITY VENOUS DOPPLER ULTRASOUND TECHNIQUE: Gray-scale sonography with graded compression, as well as color Doppler and duplex ultrasound were performed to evaluate the lower extremity deep venous systems from the level of the common femoral vein and including the common femoral, femoral, profunda femoral, popliteal and calf veins including the posterior tibial, peroneal and gastrocnemius veins when visible. The superficial great saphenous vein was also interrogated. Spectral Doppler was utilized to  evaluate flow at rest and with distal augmentation maneuvers in the common femoral, femoral and popliteal veins. COMPARISON:  None. FINDINGS: Normal flow, compressibility, and augmentation within the distal common femoral, proximal profunda femoral, proximal greater saphenous, entire femoral, popliteal veins, and imaged calf veins. The right calf veins are difficult to image. The left common femoral vein is patent without DVT. Subcutaneous edema is noted. A 3 x 1.4 x 1.6 cm popliteal/Baker's cyst is present. IMPRESSION: 1. No evidence of right lower extremity DVT. 2. Subcutaneous edema 3. 3 x 1.4 x 1.6 cm popliteal/Baker's cyst. Electronically Signed   By: Margarette Canada  M.D.   On: 04/22/2017 18:21    Procedures Procedures (including critical care time) CRITICAL CARE Performed by: Si Gaul   Total critical care time: 30 minutes  Critical care time was exclusive of separately billable procedures and treating other patients.  Critical care was necessary to treat or prevent imminent or life-threatening deterioration.  Critical care was time spent personally by me on the following activities: development of treatment plan with patient and/or surrogate as well as nursing, discussions with consultants, evaluation of patient's response to treatment, examination of patient, obtaining history from patient or surrogate, ordering and performing treatments and interventions, ordering and review of laboratory studies, ordering and review of radiographic studies, pulse oximetry and re-evaluation of patient's condition. Medications Ordered in ED Medications  ipratropium-albuterol (DUONEB) 0.5-2.5 (3) MG/3ML nebulizer solution 3 mL (3 mLs Nebulization Given 04/22/17 1646)  nitroGLYCERIN (NITROGLYN) 2 % ointment 1 inch (1 inch Topical Given 04/22/17 2025)  iopamidol (ISOVUE-370) 76 % injection 100 mL (100 mLs Intravenous Contrast Given 04/22/17 1706)  furosemide (LASIX) injection 40 mg (40 mg Intravenous Given  04/22/17 2024)  amLODipine (NORVASC) tablet 5 mg (5 mg Oral Given 04/22/17 2024)     Initial Impression / Assessment and Plan / ED Course  I have reviewed the triage vital signs and the nursing notes.  Pertinent labs & imaging results that were available during my care of the patient were reviewed by me and considered in my medical decision making (see chart for details).     Consult: Dr. Hal Hope for admission  Final Clinical Impressions(s) / ED Diagnoses   Final diagnoses:  Right leg swelling  Acute congestive heart failure, unspecified heart failure type ([place])  Hypoxia  CLL (chronic lymphocytic leukemia) ([place])  Patient is sent off a medical walk-in clinic.  She has had hypoxia.  Patient has history of CLL and has had increasing shortness of breath.  She has also had increasing lower extremity swelling.  Patient was ruled out for PE and DVT.  Findings are for small pleural effusions and small pericardial effusion with peripheral edema.  I have suspicion for CHF.  Patient is started on Lasix IV.  She requires supplemental oxygen.  Off oxygen saturations are at approximately low 80s to mid 90s depending on patient's activity.  Patient has no chest pain no fever at this time do not suspect pneumonia.  ED Discharge Orders    None       Charlesetta Shanks, MD 04/22/17 2056

## 2017-04-22 NOTE — ED Triage Notes (Signed)
[facility] with hypoxia, abnormal chest x-ray and right lower leg swelling. SAts are 86% on room air, placed on 2 liters of o2 with increase to 96%. PT endorses sob and fatigue. BReath sounds with crackles and diminished.

## 2017-04-22 NOTE — ED Notes (Signed)
Pt ambulated in hallway without oxygen, sats 82-85% on room air while walking.

## 2017-04-22 NOTE — ED Notes (Signed)
Pt in radiology 

## 2017-04-22 NOTE — ED Notes (Signed)
Pt on cardiac monitor and auto VS 

## 2017-04-22 NOTE — H&P (Signed)
History and Physical    [name] WNU:[id number] DOB: 04-Feb-1929 DOA: 04/22/2017  PCP: Shirline Frees, MD  Patient coming from: [facility] by way of home   Chief Complaint: dyspnea, cough  HPI: [name] is a 84 y.o. female with medical history significant for CLL, HTN, glaucoma, presenting with  Patient is a poor historian.  Says for 2 months has had daily sinus drainage that is very bothersome. Has also developed a cough that is productive in the morning. For the past two weeks has developed worsening shortness of breath and dyspnea on exertion. Thinks legs are a bit more swollen than normal, and also has worsening orthopnea. No fevers. No sick contacts. No vomiting or diarrhea. No chest pain.   ED Course: duoneb, nitroglycerine, lasix, imaging, labs  Review of Systems: As per HPI otherwise 10 point review of systems negative.    Past Medical History:  Diagnosis Date  . Arthritis   . CHF exacerbation ([place]) 04/22/2017  . CLL (chronic lymphocytic leukemia) ([place]) 11/10/2012  . Hypertension   . Knee pain, left   . Total knee replacement status     Past Surgical History:  Procedure Laterality Date  . APPENDECTOMY    . carpel tunnel surgery     bilateral  . CHOLECYSTECTOMY    . COLON SURGERY    . HERNIA REPAIR     left  . JOINT REPLACEMENT     Bilateral     reports that she quit smoking about 43 years ago. Her smoking use included cigarettes. She started smoking about 71 years ago. She has a 28.00 pack-year smoking history. she has never used smokeless tobacco. She reports that she drinks alcohol. Her drug history is not on file.  No Known Allergies  No family history on file.  Prior to Admission medications   Medication Sig Start Date End Date Taking? Authorizing Provider  acetaZOLAMIDE (DIAMOX) 250 MG tablet  08/28/16   [provider]  amLODipine (NORVASC) 5 MG tablet Take 1 tablet (5 mg total) by mouth daily. 12/31/12   Geradine Girt, DO  Ascorbic  Acid (VITAMIN C PO) Take 1 tablet by mouth daily.    [provider]  aspirin 325 MG tablet Take 325 mg by mouth daily as needed for pain.    [provider]  brimonidine (ALPHAGAN) 0.15 % ophthalmic solution  06/10/16   [provider]  Cholecalciferol (VITAMIN D-3 PO) Take 1 capsule by mouth daily.    [provider]  Cyanocobalamin (VITAMIN B 12 PO) Take by mouth every morning.    [provider]  dorzolamide-timolol (COSOPT) 22.3-6.8 MG/ML ophthalmic solution  12/23/14   [provider]  IMBRUVICA 420 MG TABS TAKE 1 TABLET BY MOUTH DAILY 03/30/17   [name], MD  latanoprost (XALATAN) 0.005 % ophthalmic solution  06/24/16   [provider]  losartan (COZAAR) 50 MG tablet Take 1 tablet (50 mg total) by mouth daily. 12/31/12   Geradine Girt, DO  ofloxacin (OCUFLOX) 0.3 % ophthalmic solution  08/24/16   [provider]  OVER THE COUNTER MEDICATION Take 1 tablet by mouth daily. Over the counter eye health supplement    [provider]  pilocarpine (PILOCAR) 2 % ophthalmic solution  06/24/16   [provider]  prednisoLONE acetate (PRED FORTE) 1 % ophthalmic suspension  08/24/16   [provider]  timolol (TIMOPTIC) 0.5 % ophthalmic solution  06/24/16   [provider]  VITAMIN E PO  Take 1 tablet by mouth daily.    [provider]  zinc gluconate 50 MG tablet Take 50 mg by mouth daily.    [provider]    Physical Exam: Vitals:   04/22/17 1900 04/22/17 1930 04/22/17 2000 04/22/17 2259  BP: (!) 167/87 (!) 159/76 (!) 171/90 (!) 157/84  Pulse: 75 83 70 92  Resp: 17 11 19  (!) 21  Temp:    (!) 97.5 F (36.4 C)  TempSrc:    Oral  SpO2: (!) 89% 94% 100% 93%  Weight:      Height:        Constitutional: No acute distress Head: Atraumatic Eyes: Conjunctiva clear. Right eye tearing and abnormal pupil ENM: Moist mucous membranes. Poor dentition.  Neck:  Supple Respiratory: rales at bases, otherwise clear Cardiovascular: Regular rate and rhythm. Moderate systolic murmur Abdomen: Non-tender, non-distended. No masses. No rebound or guarding. Positive bowel sounds. Musculoskeletal: No joint deformity upper and lower extremities. Normal ROM, no contractures. Normal muscle tone.  Skin: No rashes, lesions, or ulcers.  Extremities: moderate pitting LE edema to knees. warm Neurologic: Alert, moving all 4 extremities. Psychiatric: Normal insight and judgement.   Labs on Admission: I have personally reviewed following labs and imaging studies  CBC: Recent Labs  Lab 04/22/17 1554  WBC 15.0*  HGB 10.4*  HCT 34.2*  MCV 80.5  PLT 97*   Basic Metabolic Panel: Recent Labs  Lab 04/22/17 1554  NA 136  K 3.9  CL 103  CO2 28  GLUCOSE 92  BUN 15  CREATININE 0.65  CALCIUM 10.0   GFR: Estimated Creatinine Clearance: 50.5 mL/min (by C-G formula based on SCr of 0.65 mg/dL). Liver Function Tests: Recent Labs  Lab 04/22/17 1554  AST 14*  ALT 13*  ALKPHOS 47  BILITOT 1.1  PROT 6.1*  ALBUMIN 4.0   No results for input(s): LIPASE, AMYLASE in the last 168 hours. No results for input(s): AMMONIA in the last 168 hours. Coagulation Profile: No results for input(s): INR, PROTIME in the last 168 hours. Cardiac Enzymes: Recent Labs  Lab 04/22/17 1547  TROPONINI <0.03   BNP (last 3 results) No results for input(s): PROBNP in the last 8760 hours. HbA1C: No results for input(s): HGBA1C in the last 72 hours. CBG: No results for input(s): GLUCAP in the last 168 hours. Lipid Profile: No results for input(s): CHOL, HDL, LDLCALC, TRIG, CHOLHDL, LDLDIRECT in the last 72 hours. Thyroid Function Tests: No results for input(s): TSH, T4TOTAL, FREET4, T3FREE, THYROIDAB in the last 72 hours. Anemia Panel: No results for input(s): VITAMINB12, FOLATE, FERRITIN, TIBC, IRON, RETICCTPCT in the last 72 hours. Urine analysis:    Component Value  Date/Time   COLORURINE YELLOW 10/21/2006 0920   APPEARANCEUR CLEAR 10/21/2006 0920   LABSPEC 1.010 10/21/2006 0920   PHURINE 6.5 10/21/2006 0920   GLUCOSEU NEGATIVE 10/21/2006 0920   HGBUR NEGATIVE 10/21/2006 0920   BILIRUBINUR NEGATIVE 10/21/2006 0920   KETONESUR NEGATIVE 10/21/2006 0920   PROTEINUR NEGATIVE 10/21/2006 0920   UROBILINOGEN 0.2 10/21/2006 0920   NITRITE POSITIVE (A) 10/21/2006 0920   LEUKOCYTESUR SMALL (A) 10/21/2006 0920    Radiological Exams on Admission: Dg Chest 2 View  Result Date: 04/22/2017 CLINICAL DATA:  84 year old with decreased oxygen saturations. Shortness of breath. EXAM: CHEST  2 VIEW COMPARISON:  04/22/2017 chest radiograph and CT. FINDINGS: Stable cardiac enlargement. Persistent bibasilar densities that are compatible with volume loss and small effusions. Prominent central vascular structures. Atherosclerotic calcifications at the aortic arch. IMPRESSION:  Persistent bibasilar chest densities. Findings are suggestive for atelectasis and small effusions. Minimal change from the recent comparison examination. Vascular congestion and cannot exclude mild pulmonary edema. Electronically Signed   By: Markus Daft M.D.   On: 04/22/2017 17:24   Ct Angio Chest Pe W/cm &/or Wo Cm  Result Date: 04/22/2017 CLINICAL DATA:  Decreasing O2 sats. Shortness of breath and fatigue. History of CLL. EXAM: CT ANGIOGRAPHY CHEST WITH CONTRAST TECHNIQUE: Multidetector CT imaging of the chest was performed using the standard protocol during bolus administration of intravenous contrast. Multiplanar CT image reconstructions and MIPs were obtained to evaluate the vascular anatomy. CONTRAST:  174mL ISOVUE-370 IOPAMIDOL (ISOVUE-370) INJECTION 76% COMPARISON:  Standard CT chest 01/02/2009 FINDINGS: Cardiovascular: The heart is enlarged. Small pericardial effusion evident. Coronary artery calcification is evident. Atherosclerotic calcification is noted in the wall of the thoracic aorta. No filling  defect in the opacified pulmonary arteries to suggest the presence of an acute pulmonary embolus. Pulmonary arterial enlargement suggests pulmonary arterial hypertension. Mediastinum/Nodes: No mediastinal lymphadenopathy. There is no hilar lymphadenopathy. The esophagus has normal imaging features. There is no axillary lymphadenopathy. Lungs/Pleura: Interlobular septal thickening is associated with bibasilar collapse/consolidation and small to moderate bilateral pleural effusions. Upper Abdomen: Unremarkable. Musculoskeletal: Bone windows reveal no worrisome lytic or sclerotic osseous lesions. Review of the MIP images confirms the above findings. IMPRESSION: 1. No CT evidence for acute pulmonary embolus. 2. Pulmonary arterial enlargement suggests pulmonary arterial hypertension. 3. Cardiomegaly with small pericardial effusion. 4. Bibasilar collapse/consolidation with small to moderate bilateral pleural effusions. 5. Coronary artery and Aortic Atherosclerois (ICD10-170.0) Electronically Signed   By: Misty Stanley M.D.   On: 04/22/2017 17:26   US Venous Img Lower Unilateral Right  Result Date: 04/22/2017 CLINICAL DATA:  84 year old female with right lower extremity pain and swelling. EXAM: RIGHT LOWER EXTREMITY VENOUS DOPPLER ULTRASOUND TECHNIQUE: Gray-scale sonography with graded compression, as well as color Doppler and duplex ultrasound were performed to evaluate the lower extremity deep venous systems from the level of the common femoral vein and including the common femoral, femoral, profunda femoral, popliteal and calf veins including the posterior tibial, peroneal and gastrocnemius veins when visible. The superficial great saphenous vein was also interrogated. Spectral Doppler was utilized to evaluate flow at rest and with distal augmentation maneuvers in the common femoral, femoral and popliteal veins. COMPARISON:  None. FINDINGS: Normal flow, compressibility, and augmentation within the distal common  femoral, proximal profunda femoral, proximal greater saphenous, entire femoral, popliteal veins, and imaged calf veins. The right calf veins are difficult to image. The left common femoral vein is patent without DVT. Subcutaneous edema is noted. A 3 x 1.4 x 1.6 cm popliteal/Baker's cyst is present. IMPRESSION: 1. No evidence of right lower extremity DVT. 2. Subcutaneous edema 3. 3 x 1.4 x 1.6 cm popliteal/Baker's cyst. Electronically Signed   By: Margarette Canada M.D.   On: 04/22/2017 18:21     Assessment/Plan Active Problems:   CLL (chronic lymphocytic leukemia) (HCC)   Acute respiratory failure with hypoxia (HCC)   Chronic hypertension   Glaucoma   CHF exacerbation (HCC)   Anemia   Thrombocytopenia (HCC)   Acute respiratory failure (HCC)   # Acute hypoxic respiratory failure secondary to CHF exacerbation: Has orthopnea, LE edema, elevated BNP (though not particularly elevated), and small effusions on imaging with possible pulmonary edema. Trop negative. PVLs negative for DVT (ordered at outside ED). CTA neg for PE. No focal areas of consolidation on CXR to suggest pneumonia. Patient's inbruvica is  associated w/ various respiratory disorders including pneumonia and sinusitis, so if does not improve w/ standard treatment of CHF would pursue infectious treatment. Is lasix naive. - furosemide 20 mg IV qd - TTE ordered - 2 L [place], is breathing comfortably on that - f/u influenza panel  # CLL # anemia # thrombocytopenia - likely 2/2 CLL and its treatment. - will notify pt's oncologist by adding to treatment team, will appreciate recs regarding current symptoms and side effect of anti-neoplastic  # HTN - here moderate elevation - continue home amlodipine, losartan  # Glaucoma - pt thinks is only taking prednisolone eye drops - daughter to bring all meds tomorrow for review  DVT prophylaxis: lovenox, SCDs Code Status: full  Family Communication: daughter arlene Reilly [phone]  Disposition  Plan: tbd  Consults called: none  Admission status: med/surg    Desma Maxim MD [facility] Pager [phone]  If 7PM-7AM, please contact night-coverage [email or web address] Password [facility]  04/22/2017, 11:53 PM

## 2017-04-23 ENCOUNTER — Inpatient Hospital Stay (HOSPITAL_COMMUNITY): Payer: Medicare HMO

## 2017-04-23 ENCOUNTER — Encounter (HOSPITAL_COMMUNITY): Payer: Self-pay

## 2017-04-23 DIAGNOSIS — I350 Nonrheumatic aortic (valve) stenosis: Secondary | ICD-10-CM

## 2017-04-23 LAB — DIFFERENTIAL
Basophils Absolute: 0 10*3/uL (ref 0.0–0.1)
Basophils Relative: 0 %
EOS ABS: 0.1 10*3/uL (ref 0.0–0.7)
EOS PCT: 1 %
Lymphocytes Relative: 68 %
Lymphs Abs: 8.6 10*3/uL — ABNORMAL HIGH (ref 0.7–4.0)
Monocytes Absolute: 0.5 10*3/uL (ref 0.1–1.0)
Monocytes Relative: 4 %
NEUTROS PCT: 27 %
Neutro Abs: 3.4 10*3/uL (ref 1.7–7.7)

## 2017-04-23 LAB — CBC
HEMATOCRIT: 31.5 % — AB (ref 36.0–46.0)
HEMOGLOBIN: 9.7 g/dL — AB (ref 12.0–15.0)
MCH: 24.7 pg — AB (ref 26.0–34.0)
MCHC: 30.8 g/dL (ref 30.0–36.0)
MCV: 80.2 fL (ref 78.0–100.0)
Platelets: 90 10*3/uL — ABNORMAL LOW (ref 150–400)
RBC: 3.93 MIL/uL (ref 3.87–5.11)
RDW: 16.3 % — ABNORMAL HIGH (ref 11.5–15.5)
WBC: 12.5 10*3/uL — ABNORMAL HIGH (ref 4.0–10.5)

## 2017-04-23 LAB — BASIC METABOLIC PANEL
Anion gap: 5 (ref 5–15)
BUN: 13 mg/dL (ref 6–20)
CHLORIDE: 102 mmol/L (ref 101–111)
CO2: 30 mmol/L (ref 22–32)
Calcium: 9.4 mg/dL (ref 8.9–10.3)
Creatinine, Ser: 0.56 mg/dL (ref 0.44–1.00)
GFR calc non Af Amer: >60 mL/min (ref >60)
Glucose, Bld: 142 mg/dL — ABNORMAL HIGH (ref 65–99)
Potassium: 3.4 mmol/L — ABNORMAL LOW (ref 3.5–5.1)
SODIUM: 137 mmol/L (ref 135–145)

## 2017-04-23 LAB — INFLUENZA PANEL BY PCR (TYPE A & B)
INFLBPCR: NEGATIVE
Influenza A By PCR: NEGATIVE

## 2017-04-23 LAB — ECHOCARDIOGRAM COMPLETE
AO mean calculated velocity dopler: 234 cm/s
AOPV: 0.44 m/s
AOVTI: 68.4 cm
AV Peak grad: 41 mmHg
AV pk vel: 321 cm/s
AVCELMEANRAT: 0.43
AVG: 25 mmHg
AVLVOTPG: 8 mmHg
Height: 63 in
LAVOLA4C: 54.9 mL
LVOT VTI: 31.1 cm
LVOT peak VTI: 0.45 cm
LVOTPV: 142 cm/s
WEIGHTICAEL: 2816 [oz_av]

## 2017-04-23 MED ORDER — PREDNISOLONE ACETATE 1 % OP SUSP
1.0000 [drp] | Freq: Two times a day (BID) | OPHTHALMIC | Status: DC
  Administered 2017-04-23 – 2017-04-27 (×9): 1 [drp] via OPHTHALMIC
  Filled 2017-04-23: qty 5

## 2017-04-23 MED ORDER — IBRUTINIB 420 MG PO TABS
420.0000 mg | ORAL_TABLET | Freq: Every day | ORAL | Status: DC
  Administered 2017-04-23 – 2017-04-24 (×2): 420 mg via ORAL

## 2017-04-23 MED ORDER — GUAIFENESIN ER 600 MG PO TB12
600.0000 mg | ORAL_TABLET | Freq: Two times a day (BID) | ORAL | Status: DC
  Administered 2017-04-23 – 2017-04-25 (×5): 600 mg via ORAL
  Filled 2017-04-23 (×6): qty 1

## 2017-04-23 MED ORDER — AMLODIPINE BESYLATE 5 MG PO TABS
5.0000 mg | ORAL_TABLET | Freq: Every day | ORAL | Status: DC
Start: 2017-04-23 — End: 2017-04-23
  Administered 2017-04-23: 5 mg via ORAL
  Filled 2017-04-23: qty 1

## 2017-04-23 MED ORDER — FUROSEMIDE 10 MG/ML IJ SOLN
20.0000 mg | Freq: Every day | INTRAMUSCULAR | Status: DC
  Administered 2017-04-23: 20 mg via INTRAVENOUS
  Filled 2017-04-23: qty 2

## 2017-04-23 MED ORDER — FUROSEMIDE 10 MG/ML IJ SOLN: 40.0000 mg | Freq: Two times a day (BID) | INTRAMUSCULAR | Status: DC

## 2017-04-23 MED ORDER — FLUTICASONE PROPIONATE 50 MCG/ACT NA SUSP
1.0000 | Freq: Every day | NASAL | Status: DC
  Administered 2017-04-23 – 2017-04-27 (×5): 1 via NASAL
  Filled 2017-04-23: qty 16

## 2017-04-23 MED ORDER — ACETAMINOPHEN 500 MG PO TABS
1000.0000 mg | ORAL_TABLET | Freq: Three times a day (TID) | ORAL | Status: DC | PRN
  Administered 2017-04-23: 1000 mg via ORAL
  Filled 2017-04-23: qty 2

## 2017-04-23 MED ORDER — ENOXAPARIN SODIUM 40 MG/0.4ML ~~LOC~~ SOLN
40.0000 mg | SUBCUTANEOUS | Status: DC
  Administered 2017-04-23 – 2017-04-25 (×3): 40 mg via SUBCUTANEOUS
  Filled 2017-04-23 (×3): qty 0.4

## 2017-04-23 MED ORDER — LOSARTAN POTASSIUM 50 MG PO TABS
50.0000 mg | ORAL_TABLET | Freq: Every day | ORAL | Status: DC
  Administered 2017-04-23: 50 mg via ORAL
  Filled 2017-04-23: qty 1

## 2017-04-23 MED ORDER — POTASSIUM CHLORIDE 20 MEQ/15ML (10%) PO SOLN
40.0000 meq | Freq: Once | ORAL | Status: AC
  Administered 2017-04-23: 40 meq via ORAL
  Filled 2017-04-23: qty 30

## 2017-04-23 MED ORDER — FUROSEMIDE 10 MG/ML IJ SOLN
40.0000 mg | Freq: Two times a day (BID) | INTRAMUSCULAR | Status: DC
  Administered 2017-04-23 – 2017-04-26 (×6): 40 mg via INTRAVENOUS
  Filled 2017-04-23 (×6): qty 4

## 2017-04-23 NOTE — Progress Notes (Signed)
Writer called to patient's room by [facility] telemetry monitoring. Pt's O2 sats were in 60's (64 when writer checked). Pt on 2 l/min O2 via [place]. When assessing pt in room, writer noted O2 to be pointed at pt's face. Writer fixed [place] and O2 sats 86% on 2 L/min via [place]. O2 increased to 3 L/min via [place] and sats increased to 95%. Pt was asleep during this event. Pt now awake and alert. No c/o pain. Pt now getting Echo. Will continue to monitor. Pt's daughter in room.

## 2017-04-23 NOTE — Progress Notes (Signed)
Patient was noted to swallow biscuit but she states that it is still stuck in her throat; patient is able to speak clearly and cough: drank coffee without complication:primary RN was notified:patient 02 is 97% on 3L will continue to monitor patient.

## 2017-04-23 NOTE — Progress Notes (Signed)
PROGRESS NOTE  [name] [place]on JQB:[id number] DOB: 1928-07-19 DOA: 04/22/2017 PCP: Shirline Frees, MD  HPI/Recap of past 24 hours:  Sitting up on the edge of the bed, reports feeling better, C/o nasal congestion and nasal drainage No fever, denies chest pain  Reports progressive orthopnea, DOE, lower extremity edema  Son at bedside    Assessment/Plan: Active Problems:   CLL (chronic lymphocytic leukemia) (HCC)   Acute respiratory failure with hypoxia (HCC)   Chronic hypertension   Glaucoma   CHF exacerbation (HCC)   Anemia   Thrombocytopenia (HCC)   Acute respiratory failure (HCC)  Acute hypoxic respiratory failure/ acute diastolic chf, pulmonary edema, bilateral pleural effusion -CTA chest no PE, + Pulmonary arterial enlargement suggests pulmonary arterial hypertension, +Bibasilar collapse/consolidation with small to moderate bilateral pleural effusions.Coronary artery and Aortic Atherosclerois.   -Echocardiogram with  lvef 40%, grade 1 diastolic dysfunction, moderate aortic stenosis, moderately dilated left atrium, small pericardial effusion adjacent to LV. Pulmonary artery systolic pressure could not be estimated. Dilated ivc. -d/c norvasc, hold cozzar for now to leave room for diuresis, fluids restriction, daily weight, strict I/o's -cardiology consulted through epic massaging. -cardiotoxicity from ibrutinib could also be on differential , will discuss with oncology. Consider thoracentesis for fluids analysis/cytology.   RN reports patient appear to have significant hypoxia during sleep, will start nightly cpap.  Bilateral lower extremity edema, R>L Right lower extremity Venous doppler negative for DVT Elevate leg, fluids restriction, diuresis  Hypokalemia: replace k, check mag   CLL, started on ibrutinib from 07/2016 Leukocytosis with above normal range of lymphocyte  Normocytic anemia, hgb close to baseline Thrombocytopenia, seems to be progressive Will discuss  case with  oncologist Dr Marin Olp  Significant nasal congestion, drainage for several months Denies headache Nasal spray, mucinex  Glaucoma: continue eyedrops.  Code Status: full  Family Communication: patient and son at bedside  Disposition Plan: remain in the hospital   Consultants:  cardiology  oncology  Procedures:  none  Antibiotics:  none   Objective: BP 118/76 (BP Location: Right Arm)   Pulse 74   Temp (!) 97.3 F (36.3 C) (Oral)   Resp 20   Ht 5\' 3"  (1.6 m)   Wt 79.8 kg (176 lb)   SpO2 95%   BMI 31.18 kg/m  No intake or output data in the 24 hours ending 04/23/17 0827 Filed Weights   04/22/17 1535  Weight: 79.8 kg (176 lb)    Exam: Patient is examined daily including today on 04/23/2017, exams remain the same as of yesterday except that has changed    General:  NAD  Cardiovascular: RRR, + 3/6 systolic murmur at right upper sternal border  Respiratory: diminished at basis, bibasilar crackles, no wheezing, no rhonchi  Abdomen: Soft/ND/NT, positive BS  Musculoskeletal: bilateral pitting Edema R>L  Neuro: alert, oriented   Data Reviewed: Basic Metabolic Panel: Recent Labs  Lab 04/22/17 1554 04/23/17 0127  NA 136 137  K 3.9 3.4*  CL 103 102  CO2 28 30  GLUCOSE 92 142*  BUN 15 13  CREATININE 0.65 0.56  CALCIUM 10.0 9.4   Liver Function Tests: Recent Labs  Lab 04/22/17 1554  AST 14*  ALT 13*  ALKPHOS 47  BILITOT 1.1  PROT 6.1*  ALBUMIN 4.0   No results for input(s): LIPASE, AMYLASE in the last 168 hours. No results for input(s): AMMONIA in the last 168 hours. CBC: Recent Labs  Lab 04/22/17 1554 04/23/17 0127  WBC 15.0* 12.5*  NEUTROABS  --  3.4  HGB 10.4* 9.7*  HCT 34.2* 31.5*  MCV 80.5 80.2  PLT 97* 90*   Cardiac Enzymes:   Recent Labs  Lab 04/22/17 1547  TROPONINI <0.03   BNP (last 3 results) Recent Labs    04/22/17 1554  BNP 150.9*    ProBNP (last 3 results) No results for input(s): PROBNP in the  last 8760 hours.  CBG: No results for input(s): GLUCAP in the last 168 hours.  No results found for this or any previous visit (from the past 240 hour(s)).   Studies: Dg Chest 2 View  Result Date: 04/22/2017 CLINICAL DATA:  84 year old with decreased oxygen saturations. Shortness of breath. EXAM: CHEST  2 VIEW COMPARISON:  04/22/2017 chest radiograph and CT. FINDINGS: Stable cardiac enlargement. Persistent bibasilar densities that are compatible with volume loss and small effusions. Prominent central vascular structures. Atherosclerotic calcifications at the aortic arch. IMPRESSION: Persistent bibasilar chest densities. Findings are suggestive for atelectasis and small effusions. Minimal change from the recent comparison examination. Vascular congestion and cannot exclude mild pulmonary edema. Electronically Signed   By: Markus Daft M.D.   On: 04/22/2017 17:24   Ct Angio Chest Pe W/cm &/or Wo Cm  Result Date: 04/22/2017 CLINICAL DATA:  Decreasing O2 sats. Shortness of breath and fatigue. History of CLL. EXAM: CT ANGIOGRAPHY CHEST WITH CONTRAST TECHNIQUE: Multidetector CT imaging of the chest was performed using the standard protocol during bolus administration of intravenous contrast. Multiplanar CT image reconstructions and MIPs were obtained to evaluate the vascular anatomy. CONTRAST:  16mL ISOVUE-370 IOPAMIDOL (ISOVUE-370) INJECTION 76% COMPARISON:  Standard CT chest 01/02/2009 FINDINGS: Cardiovascular: The heart is enlarged. Small pericardial effusion evident. Coronary artery calcification is evident. Atherosclerotic calcification is noted in the wall of the thoracic aorta. No filling defect in the opacified pulmonary arteries to suggest the presence of an acute pulmonary embolus. Pulmonary arterial enlargement suggests pulmonary arterial hypertension. Mediastinum/Nodes: No mediastinal lymphadenopathy. There is no hilar lymphadenopathy. The esophagus has normal imaging features. There is no  axillary lymphadenopathy. Lungs/Pleura: Interlobular septal thickening is associated with bibasilar collapse/consolidation and small to moderate bilateral pleural effusions. Upper Abdomen: Unremarkable. Musculoskeletal: Bone windows reveal no worrisome lytic or sclerotic osseous lesions. Review of the MIP images confirms the above findings. IMPRESSION: 1. No CT evidence for acute pulmonary embolus. 2. Pulmonary arterial enlargement suggests pulmonary arterial hypertension. 3. Cardiomegaly with small pericardial effusion. 4. Bibasilar collapse/consolidation with small to moderate bilateral pleural effusions. 5. Coronary artery and Aortic Atherosclerois (ICD10-170.0) Electronically Signed   By: Misty Stanley M.D.   On: 04/22/2017 17:26   US Venous Img Lower Unilateral Right  Result Date: 04/22/2017 CLINICAL DATA:  84 year old female with right lower extremity pain and swelling. EXAM: RIGHT LOWER EXTREMITY VENOUS DOPPLER ULTRASOUND TECHNIQUE: Gray-scale sonography with graded compression, as well as color Doppler and duplex ultrasound were performed to evaluate the lower extremity deep venous systems from the level of the common femoral vein and including the common femoral, femoral, profunda femoral, popliteal and calf veins including the posterior tibial, peroneal and gastrocnemius veins when visible. The superficial great saphenous vein was also interrogated. Spectral Doppler was utilized to evaluate flow at rest and with distal augmentation maneuvers in the common femoral, femoral and popliteal veins. COMPARISON:  None. FINDINGS: Normal flow, compressibility, and augmentation within the distal common femoral, proximal profunda femoral, proximal greater saphenous, entire femoral, popliteal veins, and imaged calf veins. The right calf veins are difficult to image. The left common femoral vein is patent without DVT. Subcutaneous  edema is noted. A 3 x 1.4 x 1.6 cm popliteal/Baker's cyst is present. IMPRESSION: 1.  No evidence of right lower extremity DVT. 2. Subcutaneous edema 3. 3 x 1.4 x 1.6 cm popliteal/Baker's cyst. Electronically Signed   By: Margarette Canada M.D.   On: 04/22/2017 18:21    Scheduled Meds: . amLODipine  5 mg Oral Daily  . enoxaparin (LOVENOX) injection  40 mg Subcutaneous Q24H  . fluticasone  1 spray Each Nare Daily  . furosemide  20 mg Intravenous Daily  . Ibrutinib  420 mg Oral Daily  . losartan  50 mg Oral Daily  . prednisoLONE acetate  1 drop Right Eye BID PC    Continuous Infusions:   Time spent: 35 mins I have personally reviewed and interpreted on  04/23/2017 daily labs, tele strips, imagings as discussed above under date review session and assessment and plans.  I reviewed all nursing notes, pharmacy notes,  vitals, pertinent old records  I have discussed plan of care as described above with RN , patient and family on 04/23/2017   Florencia Reasons MD, PhD  [facility] Pager [phone]. If 7PM-7AM, please contact night-coverage at [email or web address], password [facility] 04/23/2017, 8:27 AM  LOS: 1 day

## 2017-04-23 NOTE — Progress Notes (Signed)
  Echocardiogram 2D Echocardiogram has been performed.  [name] L Androw 04/23/2017, 8:50 AM

## 2017-04-24 DIAGNOSIS — I509 Heart failure, unspecified: Secondary | ICD-10-CM

## 2017-04-24 DIAGNOSIS — I10 Essential (primary) hypertension: Secondary | ICD-10-CM

## 2017-04-24 LAB — MAGNESIUM: Magnesium: 1.7 mg/dL (ref 1.7–2.4)

## 2017-04-24 LAB — BASIC METABOLIC PANEL
ANION GAP: 4 — AB (ref 5–15)
BUN: 15 mg/dL (ref 6–20)
CHLORIDE: 101 mmol/L (ref 101–111)
CO2: 32 mmol/L (ref 22–32)
Calcium: 9 mg/dL (ref 8.9–10.3)
Creatinine, Ser: 0.66 mg/dL (ref 0.44–1.00)
GFR calc Af Amer: >60 mL/min (ref >60)
GFR calc non Af Amer: >60 mL/min (ref >60)
Glucose, Bld: 89 mg/dL (ref 65–99)
POTASSIUM: 3.9 mmol/L (ref 3.5–5.1)
Sodium: 137 mmol/L (ref 135–145)

## 2017-04-24 MED ORDER — MAGNESIUM OXIDE 400 (241.3 MG) MG PO TABS
400.0000 mg | ORAL_TABLET | Freq: Every day | ORAL | Status: DC
  Administered 2017-04-24 – 2017-04-27 (×4): 400 mg via ORAL
  Filled 2017-04-24 (×4): qty 1

## 2017-04-24 MED ORDER — PANTOPRAZOLE SODIUM 20 MG PO TBEC
20.0000 mg | DELAYED_RELEASE_TABLET | Freq: Every day | ORAL | Status: DC
  Administered 2017-04-24 – 2017-04-25 (×2): 20 mg via ORAL
  Filled 2017-04-24 (×4): qty 1

## 2017-04-24 MED ORDER — DOXYCYCLINE HYCLATE 100 MG PO TABS
100.0000 mg | ORAL_TABLET | Freq: Two times a day (BID) | ORAL | Status: DC
  Administered 2017-04-24 – 2017-04-27 (×7): 100 mg via ORAL
  Filled 2017-04-24 (×7): qty 1

## 2017-04-24 NOTE — Progress Notes (Signed)
PROGRESS NOTE  Flordia Kassem Gosney XLK:[id number] DOB: [birth year] DOA: 04/22/2017 PCP: Shirline Frees, MD  Brief summary:   H/o HTN, cll started on ibrutinib in 07/2016 Noticed nasal drainage, progressive orthopnea, DOE, bilateral lower extremity edema No fever, denies chest pain  HPI/Recap of past 24 hours:  Good urine output Sitting up on the edge of the bed, reports feeling better, had a good night sleep, not much cough last night per daughter She could not tolerate cpap last night   nasal congestion and nasal drainage improving  daughter at bedside    Assessment/Plan: Active Problems:   CLL (chronic lymphocytic leukemia) (HCC)   Acute respiratory failure with hypoxia (HCC)   Chronic hypertension   Glaucoma   Acute congestive heart failure (HCC)   Anemia   Thrombocytopenia (HCC)   Acute respiratory failure (HCC)  Acute hypoxic respiratory failure/ acute diastolic chf, pulmonary edema, bilateral pleural effusion -SAts are 86% on room air, placed on 2 liters of o2 with increase to 96% on presentation. -CTA chest no PE, + Pulmonary arterial enlargement suggests pulmonary arterial hypertension, +Bibasilar collapse/consolidation with small to moderate bilateral pleural effusions.Coronary artery and Aortic Atherosclerois.   -Echocardiogram with  lvef 36%, grade 1 diastolic dysfunction, moderate aortic stenosis, moderately dilated left atrium, small pericardial effusion adjacent to LV. Pulmonary artery systolic pressure could not be estimated. Dilated ivc. -d/c norvasc, hold cozzar for now to leave room for diuresis, fluids restriction, daily weight, strict I/o's --cardiotoxicity from ibrutinib could also be on differential , will discuss with oncology. Consider thoracentesis for fluids analysis/cytology.  -cardiology consulted, input appreciated  RN reports patient appear to have significant hypoxia during sleep, cpap ordered, but patient could not tolerate, continue o2  supplement Consider outpatient sleep study.   Bilateral lower extremity edema, R>L Right lower extremity Venous doppler negative for DVT ibrutinib could also cause Elevate leg, fluids restriction, diuresis  Hypokalemia: replace k, mag 1.7, start oral mag supplement   CLL, started on ibrutinib from 07/2016 Leukocytosis with above normal range of lymphocyte  Normocytic anemia, hgb close to baseline Thrombocytopenia, seems to be progressive Will discuss case with  oncologist Dr Marin Olp  Significant nasal congestion, drainage for several months ibrutinib could contribute to the symptom Denies headache Nasal spray, mucinex Start oral doxycycline  Glaucoma: continue eyedrops.  Code Status: full  Family Communication: patient and son at bedside  Disposition Plan: remain in the hospital   Consultants:  cardiology  oncology  Procedures:  none  Antibiotics:  doxycycline   Objective: BP 138/67 (BP Location: Right Arm)   Pulse 72   Temp 98.3 F (36.8 C) (Oral)   Resp 16   Ht 5\' 3"  (1.6 m)   Wt 76.1 kg (167 lb 12.3 oz)   SpO2 97%   BMI 29.72 kg/m   Intake/Output Summary (Last 24 hours) at 04/24/2017 1211 Last data filed at 04/24/2017 1100 Gross per 24 hour  Intake 720 ml  Output 900 ml  Net -180 ml   Filed Weights   04/22/17 1535 04/23/17 1500 04/24/17 0447  Weight: 79.8 kg (176 lb) 81.3 kg (179 lb 3.7 oz) 76.1 kg (167 lb 12.3 oz)    Exam: Patient is examined daily including today on 04/24/2017, exams remain the same as of yesterday except that has changed    General:  NAD  Cardiovascular: RRR, + 3/6 systolic murmur at right upper sternal border  Respiratory: diminished at basis, bibasilar crackles, no wheezing, no rhonchi  Abdomen: Soft/ND/NT, positive BS  Musculoskeletal:  bilateral pitting Edema R>L  Neuro: alert, oriented   Data Reviewed: Basic Metabolic Panel: Recent Labs  Lab 04/22/17 1554 04/23/17 0127 04/24/17 0601  NA 136 137 137   K 3.9 3.4* 3.9  CL 103 102 101  CO2 28 30 32  GLUCOSE 92 142* 89  BUN 15 13 15   CREATININE 0.65 0.56 0.66  CALCIUM 10.0 9.4 9.0  MG  --   --  1.7   Liver Function Tests: Recent Labs  Lab 04/22/17 1554  AST 14*  ALT 13*  ALKPHOS 47  BILITOT 1.1  PROT 6.1*  ALBUMIN 4.0   No results for input(s): LIPASE, AMYLASE in the last 168 hours. No results for input(s): AMMONIA in the last 168 hours. CBC: Recent Labs  Lab 04/22/17 1554 04/23/17 0127 04/24/17 0601  WBC 15.0* 12.5* 11.0*  NEUTROABS  --  3.4 2.3  HGB 10.4* 9.7* 9.2*  HCT 34.2* 31.5* 30.0*  MCV 80.5 80.2 80.2  PLT 97* 90* 97*   Cardiac Enzymes:   Recent Labs  Lab 04/22/17 1547  TROPONINI <0.03   BNP (last 3 results) Recent Labs    04/22/17 1554  BNP 150.9*    ProBNP (last 3 results) No results for input(s): PROBNP in the last 8760 hours.  CBG: No results for input(s): GLUCAP in the last 168 hours.  No results found for this or any previous visit (from the past 240 hour(s)).   Studies: No results found.  Scheduled Meds: . doxycycline  100 mg Oral Q12H  . enoxaparin (LOVENOX) injection  40 mg Subcutaneous Q24H  . fluticasone  1 spray Each Nare Daily  . furosemide  40 mg Intravenous BID  . guaiFENesin  600 mg Oral BID  . Ibrutinib  420 mg Oral Daily  . magnesium oxide  400 mg Oral Daily  . prednisoLONE acetate  1 drop Right Eye BID PC    Continuous Infusions:   Time spent: 35 mins I have personally reviewed and interpreted on  04/24/2017 daily labs, tele strips, imagings as discussed above under date review session and assessment and plans.  I reviewed all nursing notes, pharmacy notes,  vitals, pertinent old records. Case discussed with cardiology.  I have discussed plan of care as described above with RN , patient and family on 04/24/2017   Florencia Reasons MD, PhD  [facility] Pager [phone]. If 7PM-7AM, please contact night-coverage at [email or web address], password [facility] 04/24/2017, 12:11 PM   LOS: 2 days

## 2017-04-24 NOTE — Plan of Care (Signed)
RT in to see pt. for CPAP order, spoke with RN to make aware and informed RT of oxygen status of desaturation episode.

## 2017-04-24 NOTE — Consult Note (Signed)
Admit date: 04/22/2017 Referring Physician  Dr. Erlinda Hong Primary Physician  Dr. Shirline Frees Primary Cardiologist  none Reason for Consultation  SOB, CHF  HPI: [name] is a 84 y.o. female who is being seen today for the evaluation of CHF at the request of Erlinda Hong.  This is an 84yo femal with a history of CLL, HTN and CHF who presented to [facility] with a 2 week history of worsening SOB.  She says that for several months she has been having problems with sinus drainage which then drains into her throat and she starts to gag and cough.  Over the past 2 weeks she has had worsening SOB and some LE edema.  She denies any chest pain or pressure.  She denies any added sodium in her diet.  She denies any fever or chills.  On admit she was given a duoneb as well as Lasix 20mg  IV.  Cardiology is now asked to see.  She does not recall having a dx of CHF in the past or being told she has a heart murmur.  She does have a history of tobacco use but quit years ago. She has no family history of CAD.  She is on Impruvica for her CLL which according to [facility] can result in respiratory problems.      PMH:   Past Medical History:  Diagnosis Date  . Arthritis   . CHF exacerbation ([place]) 04/22/2017  . CLL (chronic lymphocytic leukemia) ([place]) 11/10/2012  . Hypertension   . Knee pain, left   . Total knee replacement status      PSH:   Past Surgical History:  Procedure Laterality Date  . APPENDECTOMY    . carpel tunnel surgery     bilateral  . CHOLECYSTECTOMY    . COLON SURGERY    . HERNIA REPAIR     left  . JOINT REPLACEMENT     Bilateral    Allergies:  Patient has no known allergies. Prior to Admit Meds:   Medications Prior to Admission  Medication Sig Dispense Refill Last Dose  . amLODipine (NORVASC) 5 MG tablet Take 1 tablet (5 mg total) by mouth daily. 30 tablet 0 04/22/2017 at Unknown time  . Ascorbic Acid (VITAMIN C PO) Take 1 tablet by mouth daily.   Past Week at Unknown time  . IMBRUVICA 420 MG TABS  TAKE 1 TABLET BY MOUTH DAILY 28 tablet 0 04/22/2017 at 1830  . losartan (COZAAR) 50 MG tablet Take 1 tablet (50 mg total) by mouth daily. 30 tablet 0 04/22/2017 at Unknown time  . prednisoLONE acetate (PRED FORTE) 1 % ophthalmic suspension Place 1 drop into the right eye 2 (two) times daily.    04/22/2017 at Unknown time   Fam HX:   No family history on file. Social HX:    Social History   Socioeconomic History  . Marital status: Married    Spouse name: Not on file  . Number of children: Not on file  . Years of education: Not on file  . Highest education level: Not on file  Social Needs  . Financial resource strain: Not on file  . Food insecurity - worry: Not on file  . Food insecurity - inability: Not on file  . Transportation needs - medical: Not on file  . Transportation needs - non-medical: Not on file  Occupational History  . Not on file  Tobacco Use  . Smoking status: Former Smoker    Packs/day: 1.00  Years: 28.00    Pack years: 28.00    Types: Cigarettes    Start date: 02/15/1946    Last attempt to quit: 12/16/1973    Years since quitting: 43.3  . Smokeless tobacco: Never Used  . Tobacco comment: quit 40 years ago  Substance and Sexual Activity  . Alcohol use: Yes    Alcohol/week: 0.0 oz  . Drug use: Not on file  . Sexual activity: Not on file  Other Topics Concern  . Not on file  Social History Narrative  . Not on file     ROS:  All  ROS were addressed and are negative except what is stated in the HPI  Physical Exam: Blood pressure 138/67, pulse 72, temperature 98.3 F (36.8 C), temperature source Oral, resp. rate 16, height 5\' 3"  (1.6 m), weight 167 lb 12.3 oz (76.1 kg), SpO2 97 %.    General: Well developed, well nourished, in no acute distress Head: Eyes PERRLA, No xanthomas.   Normal cephalic and atramatic  Lungs:   Crackles at bases bilaterally Heart:   HRRR S1 S2 Pulses are 2+ & equal. 2/6 mid peaking loud harsh SM at RUSB to LLSB            No  carotid bruit. No JVD.  No abdominal bruits. No femoral bruits. Abdomen: Bowel sounds are positive, abdomen soft and non-tender without masses or                  Hernia's noted. Msk:  Back normal, normal gait. Normal strength and tone for age. Extremities:   No clubbing, cyanosis or edema.  DP +1 Neuro: Alert and oriented X 3. Psych:  Good affect, responds appropriately    Labs:   Lab Results  Component Value Date   WBC 11.0 (H) 04/24/2017   HGB 9.2 (L) 04/24/2017   HCT 30.0 (L) 04/24/2017   MCV 80.2 04/24/2017   PLT 97 (L) 04/24/2017    Recent Labs  Lab 04/22/17 1554  04/24/17 0601  NA 136   < > 137  K 3.9   < > 3.9  CL 103   < > 101  CO2 28   < > 32  BUN 15   < > 15  CREATININE 0.65   < > 0.66  CALCIUM 10.0   < > 9.0  PROT 6.1*  --   --   BILITOT 1.1  --   --   ALKPHOS 47  --   --   ALT 13*  --   --   AST 14*  --   --   GLUCOSE 92   < > 89   < > = values in this interval not displayed.   No results found for: PTT Lab Results  Component Value Date   INR 1.2 10/31/2006   INR 1.1 10/30/2006   INR 1.1 10/29/2006   Lab Results  Component Value Date   TROPONINI <0.03 04/22/2017    No results found for: CHOL No results found for: HDL No results found for: LDLCALC No results found for: TRIG No results found for: CHOLHDL No results found for: LDLDIRECT    Radiology:  Dg Chest 2 View  Result Date: 04/22/2017 CLINICAL DATA:  84 year old with decreased oxygen saturations. Shortness of breath. EXAM: CHEST  2 VIEW COMPARISON:  04/22/2017 chest radiograph and CT. FINDINGS: Stable cardiac enlargement. Persistent bibasilar densities that are compatible with volume loss and small effusions. Prominent central vascular structures. Atherosclerotic calcifications at the  aortic arch. IMPRESSION: Persistent bibasilar chest densities. Findings are suggestive for atelectasis and small effusions. Minimal change from the recent comparison examination. Vascular congestion and cannot  exclude mild pulmonary edema. Electronically Signed   By: Markus Daft M.D.   On: 04/22/2017 17:24   Ct Angio Chest Pe W/cm &/or Wo Cm  Result Date: 04/22/2017 CLINICAL DATA:  Decreasing O2 sats. Shortness of breath and fatigue. History of CLL. EXAM: CT ANGIOGRAPHY CHEST WITH CONTRAST TECHNIQUE: Multidetector CT imaging of the chest was performed using the standard protocol during bolus administration of intravenous contrast. Multiplanar CT image reconstructions and MIPs were obtained to evaluate the vascular anatomy. CONTRAST:  16mL ISOVUE-370 IOPAMIDOL (ISOVUE-370) INJECTION 76% COMPARISON:  Standard CT chest 01/02/2009 FINDINGS: Cardiovascular: The heart is enlarged. Small pericardial effusion evident. Coronary artery calcification is evident. Atherosclerotic calcification is noted in the wall of the thoracic aorta. No filling defect in the opacified pulmonary arteries to suggest the presence of an acute pulmonary embolus. Pulmonary arterial enlargement suggests pulmonary arterial hypertension. Mediastinum/Nodes: No mediastinal lymphadenopathy. There is no hilar lymphadenopathy. The esophagus has normal imaging features. There is no axillary lymphadenopathy. Lungs/Pleura: Interlobular septal thickening is associated with bibasilar collapse/consolidation and small to moderate bilateral pleural effusions. Upper Abdomen: Unremarkable. Musculoskeletal: Bone windows reveal no worrisome lytic or sclerotic osseous lesions. Review of the MIP images confirms the above findings. IMPRESSION: 1. No CT evidence for acute pulmonary embolus. 2. Pulmonary arterial enlargement suggests pulmonary arterial hypertension. 3. Cardiomegaly with small pericardial effusion. 4. Bibasilar collapse/consolidation with small to moderate bilateral pleural effusions. 5. Coronary artery and Aortic Atherosclerois (ICD10-170.0) Electronically Signed   By: Misty Stanley M.D.   On: 04/22/2017 17:26   US Venous Img Lower Unilateral  Right  Result Date: 04/22/2017 CLINICAL DATA:  84 year old female with right lower extremity pain and swelling. EXAM: RIGHT LOWER EXTREMITY VENOUS DOPPLER ULTRASOUND TECHNIQUE: Gray-scale sonography with graded compression, as well as color Doppler and duplex ultrasound were performed to evaluate the lower extremity deep venous systems from the level of the common femoral vein and including the common femoral, femoral, profunda femoral, popliteal and calf veins including the posterior tibial, peroneal and gastrocnemius veins when visible. The superficial great saphenous vein was also interrogated. Spectral Doppler was utilized to evaluate flow at rest and with distal augmentation maneuvers in the common femoral, femoral and popliteal veins. COMPARISON:  None. FINDINGS: Normal flow, compressibility, and augmentation within the distal common femoral, proximal profunda femoral, proximal greater saphenous, entire femoral, popliteal veins, and imaged calf veins. The right calf veins are difficult to image. The left common femoral vein is patent without DVT. Subcutaneous edema is noted. A 3 x 1.4 x 1.6 cm popliteal/Baker's cyst is present. IMPRESSION: 1. No evidence of right lower extremity DVT. 2. Subcutaneous edema 3. 3 x 1.4 x 1.6 cm popliteal/Baker's cyst. Electronically Signed   By: Margarette Canada M.D.   On: 04/22/2017 18:21     Telemetry    NSR - Personally Reviewed  ECG    NSR with RBBB - Personally Reviewed   ASSESSMENT/PLAN:    1.  SOB - likely multifactorial from acute diastolic CHF with pleural effusions as well as chronic sinus drainage.  2D echo showed normal LVF with EF 55-60% with moderate LVH and moderate AS.  There are bilateral pleural effusions with bibasilar collapse/consolidation on Chest CT.  BNP is mildly elevated at 150. She does have crackles on exam so appears volume overloaded.  Also, Chest CT showed coronary artery calcifications but  she has no CP.  SOB could also be due to  coronary ischemia.  Trop neg.   - recommend increasing Lasix to 40mg  IV BID - follow renal function, I&O's and daily weights closely - consider nuclear stress test once CHF resolved to rule out ischemia.   - given underlying consolidation on Chest CT with cough and mucous production, consider underlying concommitant PNA  2.  Heart murmur with moderate AS on echo with mean AVG 28mmHg.   - will need yearly echo to follow AS  3.  HTN - BP controlled on no BP meds.  4.  RBBB - this is old as of EKG in 2014.   - plan nuclear stress test in light of coronary artery calcifications and SOB once CHF resolves.  Fransico Him, MD  04/24/2017  9:21 AM

## 2017-04-24 NOTE — Evaluation (Signed)
Clinical/Bedside Swallow Evaluation Patient Details  Name: LANA FLAIM MRN: [id number] Date of Birth: 04-15-1928  Today's Date: 04/24/2017 Time: SLP Start Time (ACUTE ONLY): 1215 SLP Stop Time (ACUTE ONLY): 1242 SLP Time Calculation (min) (ACUTE ONLY): 27 min  Past Medical History:  Past Medical History:  Diagnosis Date  . Arthritis   . CHF exacerbation ([place]) 04/22/2017  . CLL (chronic lymphocytic leukemia) ([place]) 11/10/2012  . Hypertension   . Knee pain, left   . Total knee replacement status    Past Surgical History:  Past Surgical History:  Procedure Laterality Date  . APPENDECTOMY    . carpel tunnel surgery     bilateral  . CHOLECYSTECTOMY    . COLON SURGERY    . HERNIA REPAIR     left  . JOINT REPLACEMENT     Bilateral   HPI:  Chalise Pe Dieringis a 84 y.o.femalewith medical history significant forCLL, HTN, glaucoma, presenting with cough and dyspnea.  Patient reporting that for 2 months has had daily sinus drainage that is very bothersome. She has also developed a cough that is productive in the morning. For the past two weeks has developed worsening shortness of breath and dyspnea on exertion. She thinks legs are a bit more swollen than normal, and also has worsening orthopnea. No fevers. No sick contacts. No vomiting or diarrhea. No chest pain.  Chest xray is showing bibasilar densities suggestive of atelectasis and small bilateral pleural effusions.  CT angio of the chest is showing bibasilar collapse/consolidation with small to moderate bilateral pleural effusions.     Assessment / Plan / Recommendation Clinical Impression  Clinical swallowing evaluation was completed in the setting for admission due to cough and dyspnea.  The patient was given thin liquids via cup and straw sips, pureed material and dry solids.  Oral mechanism exam was completed and unremarkable.  The patient presented with a possible oral/pharyngeal and esophageal dysphagia.  Orally the patient was  mildly slow to masticate dry solids which may have been impacted by poorly fitting lower dentures.  The pharyngeal phase was marked by what appeared to be a timely swallow trigger and hyo-laryngeal excursion was appreciated to palpation.  The patient was unable to complete the 3 oz water challenge.  The patient also complains of a globus sensation and material not wanting to go down.  She also complains of odynphagia particularly on the right side of her neck.  No consistent overt s/s of aspiration were seen.  Recommend continue with current diet pending results of MBS next date to fully assess swallowing physiology.   SLP Visit Diagnosis: Dysphagia, unspecified (R13.10)    Aspiration Risk  Mild aspiration risk    Diet Recommendation   Regular with thin liquids  Medication Administration: Whole meds with liquid    Other  Recommendations Oral Care Recommendations: Oral care BID   Follow up Recommendations Other (comment)(TBD)        Swallow Study   General Date of Onset: 04/22/17 HPI: Keesha Pellum Dieringis a 84 y.o.femalewith medical history significant forCLL, HTN, glaucoma, presenting with cough and dyspnea.  Patient reporting that for 2 months has had daily sinus drainage that is very bothersome. She has also developed a cough that is productive in the morning. For the past two weeks has developed worsening shortness of breath and dyspnea on exertion. She thinks legs are a bit more swollen than normal, and also has worsening orthopnea. No fevers. No sick contacts. No vomiting or diarrhea. No chest pain.  Chest xray is showing bibasilar densities suggestive of atelectasis and small bilateral pleural effusions.  CT angio of the chest is showing bibasilar collapse/consolidation with small to moderate bilateral pleural effusions.   Type of Study: Bedside Swallow Evaluation Previous Swallow Assessment: None noted at [facility]. Diet Prior to this Study: Regular;Thin liquids Temperature Spikes Noted:  No History of Recent Intubation: No Behavior/Cognition: Alert;Cooperative;Pleasant mood Oral Cavity Assessment: Within Functional Limits Oral Care Completed by SLP: No Oral Cavity - Dentition: Dentures, top;Dentures, bottom Vision: Functional for self-feeding Self-Feeding Abilities: Able to feed self Patient Positioning: Upright in bed Baseline Vocal Quality: Normal Volitional Cough: Strong Volitional Swallow: Able to elicit    Oral/Motor/Sensory Function Overall Oral Motor/Sensory Function: Within functional limits   Ice Chips Ice chips: Not tested   Thin Liquid Thin Liquid: Within functional limits Presentation: Cup;Straw;Self Fed    Nectar Thick Nectar Thick Liquid: Not tested   Honey Thick Honey Thick Liquid: Not tested   Puree Puree: Within functional limits Presentation: Spoon;Self Fed   Solid   GO   Solid: Within functional limits Presentation: [place], [place] SLP [phone]  Lamar Sprinkles 04/24/2017,12:52 PM

## 2017-04-24 NOTE — Plan of Care (Signed)
  Pain Managment: General experience of comfort will improve 04/24/2017 2319 - Adequate for Discharge by Mickie Kay, RN

## 2017-04-25 ENCOUNTER — Inpatient Hospital Stay (HOSPITAL_COMMUNITY): Payer: Medicare HMO

## 2017-04-25 DIAGNOSIS — I5031 Acute diastolic (congestive) heart failure: Secondary | ICD-10-CM

## 2017-04-25 DIAGNOSIS — I4510 Unspecified right bundle-branch block: Secondary | ICD-10-CM

## 2017-04-25 DIAGNOSIS — I350 Nonrheumatic aortic (valve) stenosis: Secondary | ICD-10-CM

## 2017-04-25 LAB — CBC WITH DIFFERENTIAL/PLATELET
BASOS ABS: 0 10*3/uL (ref 0.0–0.1)
BASOS PCT: 0 %
Basophils Absolute: 0 10*3/uL (ref 0.0–0.1)
Basophils Relative: 0 %
EOS PCT: 1 %
EOS PCT: 2 %
Eosinophils Absolute: 0.1 10*3/uL (ref 0.0–0.7)
Eosinophils Absolute: 0.2 10*3/uL (ref 0.0–0.7)
HCT: 30 % — ABNORMAL LOW (ref 36.0–46.0)
HEMATOCRIT: 31 % — AB (ref 36.0–46.0)
HEMOGLOBIN: 9.2 g/dL — AB (ref 12.0–15.0)
Hemoglobin: 9.3 g/dL — ABNORMAL LOW (ref 12.0–15.0)
LYMPHS PCT: 71 %
Lymphocytes Relative: 73 %
Lymphs Abs: 8 10*3/uL — ABNORMAL HIGH (ref 0.7–4.0)
Lymphs Abs: 8.4 10*3/uL — ABNORMAL HIGH (ref 0.7–4.0)
MCH: 24.6 pg — AB (ref 26.0–34.0)
MCH: 24.2 pg — ABNORMAL LOW (ref 26.0–34.0)
MCHC: 30.7 g/dL (ref 30.0–36.0)
MCHC: 30 g/dL (ref 30.0–36.0)
MCV: 80.2 fL (ref 78.0–100.0)
MCV: 80.5 fL (ref 78.0–100.0)
MONOS PCT: 5 %
Monocytes Absolute: 0.6 10*3/uL (ref 0.1–1.0)
Monocytes Absolute: 0.6 10*3/uL (ref 0.1–1.0)
Monocytes Relative: 5 %
NEUTROS PCT: 21 %
NEUTROS PCT: 22 %
Neutro Abs: 2.3 10*3/uL (ref 1.7–7.7)
Neutro Abs: 2.6 10*3/uL (ref 1.7–7.7)
Platelets: 97 10*3/uL — ABNORMAL LOW (ref 150–400)
Platelets: 80 10*3/uL — ABNORMAL LOW (ref 150–400)
RBC: 3.74 MIL/uL — AB (ref 3.87–5.11)
RBC: 3.85 MIL/uL — ABNORMAL LOW (ref 3.87–5.11)
RDW: 16.6 % — ABNORMAL HIGH (ref 11.5–15.5)
RDW: 16.6 % — ABNORMAL HIGH (ref 11.5–15.5)
WBC: 11 10*3/uL — AB (ref 4.0–10.5)
WBC: 11.8 10*3/uL — ABNORMAL HIGH (ref 4.0–10.5)

## 2017-04-25 LAB — BASIC METABOLIC PANEL
ANION GAP: 5 (ref 5–15)
BUN: 19 mg/dL (ref 6–20)
CHLORIDE: 97 mmol/L — AB (ref 101–111)
CO2: 32 mmol/L (ref 22–32)
Calcium: 9 mg/dL (ref 8.9–10.3)
Creatinine, Ser: 0.74 mg/dL (ref 0.44–1.00)
GFR calc Af Amer: >60 mL/min (ref >60)
GFR calc non Af Amer: >60 mL/min (ref >60)
GLUCOSE: 94 mg/dL (ref 65–99)
POTASSIUM: 3.9 mmol/L (ref 3.5–5.1)
Sodium: 134 mmol/L — ABNORMAL LOW (ref 135–145)

## 2017-04-25 LAB — MAGNESIUM: Magnesium: 1.6 mg/dL — ABNORMAL LOW (ref 1.7–2.4)

## 2017-04-25 LAB — PATHOLOGIST SMEAR REVIEW

## 2017-04-25 MED ORDER — SENNOSIDES-DOCUSATE SODIUM 8.6-50 MG PO TABS
1.0000 | ORAL_TABLET | Freq: Two times a day (BID) | ORAL | Status: DC
  Administered 2017-04-26 (×2): 1 via ORAL
  Filled 2017-04-25 (×3): qty 1

## 2017-04-25 MED ORDER — MAGNESIUM SULFATE 2 GM/50ML IV SOLN
2.0000 g | Freq: Once | INTRAVENOUS | Status: AC
  Administered 2017-04-25: 2 g via INTRAVENOUS
  Filled 2017-04-25: qty 50

## 2017-04-25 MED ORDER — LIVING BETTER WITH HEART FAILURE BOOK
Freq: Once | Status: AC
  Administered 2017-04-25: 15:00:00
  Filled 2017-04-25: qty 1

## 2017-04-25 NOTE — Progress Notes (Addendum)
PROGRESS NOTE  [name] LKG:[id number] DOB: [birth year] DOA: 04/22/2017 PCP: Shirline Frees, MD  Brief summary:   H/o HTN, cll started on ibrutinib in 07/2016 Noticed nasal drainage, progressive orthopnea, DOE, bilateral lower extremity edema No fever, denies chest pain  HPI/Recap of past 24 hours:  Good urine output, 1.6 liter documented Sitting up on the edge of the bed, reports feeling better,     nasal congestion and nasal drainage improving    Assessment/Plan: Active Problems:   CLL (chronic lymphocytic leukemia) (HCC)   Acute respiratory failure with hypoxia (HCC)   Chronic hypertension   Glaucoma   Acute diastolic CHF (congestive heart failure) (HCC)   Anemia   Thrombocytopenia (HCC)   Acute respiratory failure (HCC)   RBBB  Acute on chronic hypoxic respiratory failure/ acute diastolic chf, pulmonary edema, bilateral pleural effusion -per outpatient oncology notes she has hypoxia in their clinic in 01/2017 as well -on presentation this hospitalization, she is found to have SAts  86% on room air. She is placed on 2 liters of o2 with increase to 96%  -CTA chest no PE, + Pulmonary arterial enlargement suggests pulmonary arterial hypertension, +Bibasilar collapse/consolidation with small to moderate bilateral pleural effusions.Coronary artery and Aortic Atherosclerois.   -Echocardiogram with  lvef 66%, grade 1 diastolic dysfunction, moderate aortic stenosis, moderately dilated left atrium, small pericardial effusion adjacent to LV. Pulmonary artery systolic pressure could not be estimated. Dilated ivc. -d/c norvasc, hold cozzar for now to leave room for diuresis, fluids restriction, daily weight, strict I/o's --cardiotoxicity from ibrutinib could also be on differential , case discussed with oncology Dr Lebron Conners who is covering Dr Marin Olp today on 1/28. ibrutinib on hold for now. She is to follow up with oncology outpateint to discuss cll treatment.  -Consider  thoracentesis for fluids analysis/cytology if persistent pleural effusion, need to wait for plt to trend up. -oncology and cardiology consulted, input appreciated  RN reports patient appear to have significant hypoxia during sleep, cpap ordered, but patient could not tolerate, continue o2 supplement Consider outpatient sleep study.   Bilateral lower extremity edema, R>L Right lower extremity Venous doppler negative for DVT ibrutinib on hold per oncology Elevate leg, fluids restriction, diuresis  Hypokalemia/hypomagnesemia: replace k, mag   Dysphagia: MBS concerns for esophageal phase issue  DG esophagus ordered.   CLL, started on ibrutinib from 07/2016 Mild Leukocytosis with above normal range of lymphocyte  Normocytic anemia, hgb close to baseline Thrombocytopenia, seems to be progressive, could be due to heart failure, will monitor  case discussed with  Oncologist on call Dr Lebron Conners, hold ibrutinib for now, details please refer to his note.  Significant nasal congestion, drainage for several months ibrutinib could contribute to the symptom, hold ibrutinib Denies headache Nasal spray, mucinex Start oral doxycycline  Glaucoma: continue eyedrops.  Body mass index is 31.13 kg/m.   Code Status: full  Family Communication: patient   Disposition Plan: remain in the hospital   Consultants:  cardiology  oncology  Procedures:  none  Antibiotics:  Doxycycline from 1/27   Objective: BP (!) 137/57 (BP Location: Right Arm)   Pulse 68   Temp 98.2 F (36.8 C) (Oral)   Resp 17   Ht 5\' 3"  (1.6 m)   Wt 79.7 kg (175 lb 11.3 oz)   SpO2 93%   BMI 31.13 kg/m   Intake/Output Summary (Last 24 hours) at 04/25/2017 1103 Last data filed at 04/24/2017 2200 Gross per 24 hour  Intake 480 ml  Output 700  ml  Net -220 ml   Filed Weights   04/23/17 1500 04/24/17 0447 04/25/17 0458  Weight: 81.3 kg (179 lb 3.7 oz) 76.1 kg (167 lb 12.3 oz) 79.7 kg (175 lb 11.3 oz)     Exam: Patient is examined daily including today on 04/25/2017, exams remain the same as of yesterday except that has changed    General:  NAD  Cardiovascular: RRR, + 3/6 systolic murmur at right upper sternal border  Respiratory: diminished at basis, bibasilar crackles, no wheezing, no rhonchi  Abdomen: Soft/ND/NT, positive BS  Musculoskeletal: bilateral pitting Edema R>L  Neuro: alert, oriented   Data Reviewed: Basic Metabolic Panel: Recent Labs  Lab 04/22/17 1554 04/23/17 0127 04/24/17 0601 04/25/17 0553  NA 136 137 137 134*  K 3.9 3.4* 3.9 3.9  CL 103 102 101 97*  CO2 28 30 32 32  GLUCOSE 92 142* 89 94  BUN 15 13 15 19   CREATININE 0.65 0.56 0.66 0.74  CALCIUM 10.0 9.4 9.0 9.0  MG  --   --  1.7 1.6*   Liver Function Tests: Recent Labs  Lab 04/22/17 1554  AST 14*  ALT 13*  ALKPHOS 47  BILITOT 1.1  PROT 6.1*  ALBUMIN 4.0   No results for input(s): LIPASE, AMYLASE in the last 168 hours. No results for input(s): AMMONIA in the last 168 hours. CBC: Recent Labs  Lab 04/22/17 1554 04/23/17 0127 04/24/17 0601 04/25/17 0553  WBC 15.0* 12.5* 11.0* 11.8*  NEUTROABS  --  3.4 2.3 2.6  HGB 10.4* 9.7* 9.2* 9.3*  HCT 34.2* 31.5* 30.0* 31.0*  MCV 80.5 80.2 80.2 80.5  PLT 97* 90* 97* 80*   Cardiac Enzymes:   Recent Labs  Lab 04/22/17 1547  TROPONINI <0.03   BNP (last 3 results) Recent Labs    04/22/17 1554  BNP 150.9*    ProBNP (last 3 results) No results for input(s): PROBNP in the last 8760 hours.  CBG: No results for input(s): GLUCAP in the last 168 hours.  No results found for this or any previous visit (from the past 240 hour(s)).   Studies: No results found.  Scheduled Meds: . doxycycline  100 mg Oral Q12H  . enoxaparin (LOVENOX) injection  40 mg Subcutaneous Q24H  . fluticasone  1 spray Each Nare Daily  . furosemide  40 mg Intravenous BID  . guaiFENesin  600 mg Oral BID  . Living Better with Heart Failure Book   Does not apply  Once  . magnesium oxide  400 mg Oral Daily  . pantoprazole  20 mg Oral Daily  . prednisoLONE acetate  1 drop Right Eye BID PC    Continuous Infusions:   Time spent: 35 mins, case discussed with cardiology and oncology.  I have personally reviewed and interpreted on  04/25/2017 daily labs, tele strips, imagings as discussed above under date review session and assessment and plans.  I reviewed all nursing notes, pharmacy notes,  vitals, pertinent old records. Case discussed with cardiology.  I have discussed plan of care as described above with RN , patient on 04/25/2017   Florencia Reasons MD, PhD  [facility] Pager [phone]. If 7PM-7AM, please contact night-coverage at [email or web address], password [facility] 04/25/2017, 11:03 AM  LOS: 3 days

## 2017-04-25 NOTE — Progress Notes (Signed)
MBS completed, full report to follow.  Pt with functional oropharyngeal swallow with no aspiration/penetration nor significant oropharyngeal residuals.  Upon esophageal sweep, pt's distal esophagus appeared with retention - with barium tablet lodging near GE requiring more liquids to transit into stomach. Pt with referrant sensation of residuals to pharynx during MBS x2.  Above appearance of esophageal tapering, esophagus appeared dilated/wide - no radiologist present to confirm findings.  Suspect primary esophageal dysphagia based on findings.  Advised pt to mitigation strategies - she reports issues with "post-nasal drip" and denies significant deficits with swallowing.  Consideration for dedicated esophagram may be beneficial to elucidate.  Thanks for this consult.   Luanna Salk, [place] [facility] SLP [phone]

## 2017-04-25 NOTE — Consult Note (Signed)
IP PROGRESS NOTE  Subjective:  [name] is a 84 y.o. female followed by Dr Marin Olp for diagnosis of chronic lymphocytic leukemia, currently undergoing palliative systemic therapy with ibrutinib 420 mg oral daily since May 2018.  Patient was last seen in the clinic on 03/10/17 with report of no significant side effects.  It appears that the patient presented to the urgent care with shortness of breath progressive over several previous days.  She was found to have hypoxia with oxygen saturation of 86% on room air.  Chest x-ray demonstrated pleural effusion and patient was referred to emergency room for additional diagnosis on 04/22/17.  In the emergency room, patient underwent CTA of the chest demonstrated cardiomegaly, small pericardial effusion, pulmonary hypertension evidence, but no evidence of pulmonary embolism.  Small to moderate bilateral pleural effusions were also demonstrated.  Patient was admitted for additional evaluations.  Ultrasound of bilateral lower extremities showed no evidence of deep vein thrombosis.  Echocardiogram obtained on 04/23/17 demonstrated preserved left ventricular ejection fraction of 55-60%, but abnormal left ventricular relaxation issues.  Moderate stenosis of the aortic valve noted.  No report on mitral regurgitation or stenosis.  Left atrial dilation is noted.  Patient has received force diuresis with good urine output.  Continues to have nasal congestion, postnasal drip with clear drainage that is hard to clear in the mornings.  These causes significant discomfort to the patient was choking and cough.  Breathing has improved with volume management.  Denies active chest pain, shortness of breath at rest while on oxygen.  No lightheadedness or dizziness.  No palpitations, syncope, or near syncope.  Bilateral lower extremity swelling that has been present for a long period of time and does not new to the patient.  Objective: Vital signs in last 24 hours: Blood  pressure 136/65, pulse (!) 102, temperature 98.4 F (36.9 C), temperature source Oral, resp. rate 18, height 5\' 3"  (1.6 m), weight 175 lb 11.3 oz (79.7 kg), SpO2 90 %.  Intake/Output from previous day: 01/27 0701 - 01/28 0700 In: 89 [P.O.:720] Out: 1600 [Urine:1600]  Physical Exam:  Pleasant frail elderly female who is alert, awake, oriented x3. HEENT: Anicteric sclerae.  Moist mucous membranes, no oral ulcers. Lungs: Improving aeration, persistent bilateral basal crackles.  No significant dullness to percussion at this time. Cardiac: S1/S2, regular.  Loud systolic murmur over the right upper sternal border as well as the apex of the heart with a more subtle murmur over the left upper sternal border Abdomen: Soft, nontender, nondistended.  Bowel sounds are positive.  No palpable masses Extremities: Bilateral 2+ lower extremity pitting edema. Neuro: No gross focal neurological deficits  Lab Results: Recent Labs    04/24/17 0601 04/25/17 0553  WBC 11.0* 11.8*  HGB 9.2* 9.3*  HCT 30.0* 31.0*  PLT 97* 80*    BMET Recent Labs    04/24/17 0601 04/25/17 0553  NA 137 134*  K 3.9 3.9  CL 101 97*  CO2 32 32  GLUCOSE 89 94  BUN 15 19  CREATININE 0.66 0.74  CALCIUM 9.0 9.0    No results found for: CEA1  Studies/Results: Dg Swallowing Func-speech Pathology  Result Date: 04/25/2017 Objective Swallowing Evaluation: Type of Study: MBS-Modified Barium Swallow Study  Patient Details Name: [name] MRN: [id number] Date of Birth: 12-May-1928 Today's Date: 04/25/2017 Time: SLP Start Time (ACUTE ONLY): 1050 -SLP Stop Time (ACUTE ONLY): 1110 SLP Time Calculation (min) (ACUTE ONLY): 20 min Past Medical History: Past Medical History: Diagnosis Date .  Arthritis  . CHF exacerbation ([place]) 04/22/2017 . CLL (chronic lymphocytic leukemia) ([place]) 11/10/2012 . Hypertension  . Knee pain, left  . Total knee replacement status  Past Surgical History: Past Surgical History: Procedure Laterality Date .  APPENDECTOMY   . carpel tunnel surgery    bilateral . CHOLECYSTECTOMY   . COLON SURGERY   . HERNIA REPAIR    left . JOINT REPLACEMENT    Bilateral HPI: Bridget Levay Dieringis a 84 y.o.femalewith medical history significant forCLL, HTN, glaucoma, presenting with cough and dyspnea.  Patient reporting that for 2 months has had daily sinus drainage that is very bothersome. She has also developed a cough that is productive in the morning. For the past two weeks has developed worsening shortness of breath and dyspnea on exertion. She thinks legs are a bit more swollen than normal, and also has worsening orthopnea. No fevers. No sick contacts. No vomiting or diarrhea. No chest pain.  Chest xray is showing bibasilar densities suggestive of atelectasis and small bilateral pleural effusions.  CT angio of the chest is showing bibasilar collapse/consolidation with small to moderate bilateral pleural effusions.   Subjective: pt awake in chair Assessment / Plan / Recommendation CHL IP CLINICAL IMPRESSIONS 04/24/2017 Clinical Impression Pt with functional oropharyngeal swallow with no aspiration/penetration nor significant oropharyngeal residuals.  Upon esophageal sweep, pt's distal esophagus appeared with retention - with barium tablet lodging near GE requiring more liquids to transit into stomach. Pt with referrant sensation of residuals to pharynx during MBS x2.  Above appearance of esophageal tapering, esophagus appeared dilated/wide - no radiologist present to confirm findings.  Suspect primary esophageal dysphagia based on findings.  Advised pt to mitigation strategies - she reports issues with "post-nasal drip" and denies significant deficits with swallowing.  Consideration for dedicated esophagram may be beneficial to elucidate.  Thanks for this consult.  SLP Visit Diagnosis Dysphagia, unspecified (R13.10) Attention and concentration deficit following -- Frontal lobe and executive function deficit following -- Impact on  safety and function Mild aspiration risk   No flowsheet data found.  Prognosis 04/25/2017 Prognosis for Safe Diet Advancement Good Barriers to Reach Goals Other (Comment) Barriers/Prognosis Comment -- CHL IP DIET RECOMMENDATION 04/24/2017 SLP Diet Recommendations Regular solids;Thin liquid Liquid Administration via Cup;Straw Medication Administration Whole meds with liquid Compensations Slow rate;Small sips/bites Postural Changes Remain semi-upright after after feeds/meals (Comment);Seated upright at 90 degrees   CHL IP OTHER RECOMMENDATIONS 04/24/2017 Recommended Consults Consider esophageal assessment Oral Care Recommendations Oral care BID Other Recommendations --   CHL IP FOLLOW UP RECOMMENDATIONS 04/24/2017 Follow up Recommendations None   No flowsheet data found.     CHL IP ORAL PHASE 04/25/2017 Oral Phase WFL Oral - Pudding Teaspoon -- Oral - Pudding Cup -- Oral - Honey Teaspoon -- Oral - Honey Cup -- Oral - Nectar Teaspoon -- Oral - Nectar Cup WFL Oral - Nectar Straw -- Oral - Thin Teaspoon -- Oral - Thin Cup WFL Oral - Thin Straw WFL Oral - Puree WFL;Piecemeal swallowing Oral - Mech Soft -- Oral - Regular WFL Oral - Multi-Consistency -- Oral - Pill WFL Oral Phase - Comment --  CHL IP PHARYNGEAL PHASE 04/25/2017 Pharyngeal Phase WFL Pharyngeal- Pudding Teaspoon -- Pharyngeal -- Pharyngeal- Pudding Cup -- Pharyngeal -- Pharyngeal- Honey Teaspoon -- Pharyngeal -- Pharyngeal- Honey Cup -- Pharyngeal -- Pharyngeal- Nectar Teaspoon -- Pharyngeal -- Pharyngeal- Nectar Cup WFL Pharyngeal -- Pharyngeal- Nectar Straw -- Pharyngeal -- Pharyngeal- Thin Teaspoon -- Pharyngeal -- Pharyngeal- Thin Cup WFL Pharyngeal -- Pharyngeal-  Thin Straw WFL Pharyngeal -- Pharyngeal- Puree WFL Pharyngeal -- Pharyngeal- Mechanical Soft -- Pharyngeal -- Pharyngeal- Regular WFL Pharyngeal -- Pharyngeal- Multi-consistency -- Pharyngeal -- Pharyngeal- Pill WFL Pharyngeal -- Pharyngeal Comment --  CHL IP CERVICAL ESOPHAGEAL PHASE 04/25/2017  Cervical Esophageal Phase WFL Pudding Teaspoon -- Pudding Cup -- Honey Teaspoon -- Honey Cup -- Nectar Teaspoon -- Nectar Cup -- Nectar Straw -- Thin Teaspoon -- Thin Cup -- Thin Straw -- Puree -- Mechanical Soft -- Regular -- Multi-consistency -- Pill -- Cervical Esophageal Comment -- No flowsheet data found. Luanna Salk, MS [facility] SLP [phone]               Medications: I have reviewed the patient's current medications.  Assessment/Plan: 84 y.o. female with diagnosis of chronic lymphocytic leukemia, recently on treatment with ibrutinib.  Treatment with ibrutinib resulted in significant improvement in lymphocytosis, but concurrently patient has been demonstrating improved anemia but worsening thrombocytopenia over a period of time.  Now, patient is admitted to the hospital due to hypoxia with resting O2 sat of 86%.  So far, evaluation demonstrated possible volume overload with pulmonary congestion as well as presence of significant pulmonary hypertension and diastolic left ventricular dysfunction.  No previous echocardiogram available in our system to compare to the pretreatment appearance of the heart.  It is likely that pertinent past contributed, at least partially, to the current symptoms including lower extremity edema, volume overload, cardiac dysfunction, and possibly upper respiratory tract congestion.  Progressive thrombocytopenia may be due to dilutional effect with volume retention, effect of ibrutinib, or direct effect of underlying hematological disorder.  Recommendations: --Recommend holding ibrutinib at this time --Continue volume management as per primary treatment service --Arrange a follow-up with the primary oncologist on discharge from the hospital to review the clinical situation, lab work, and discuss further need for CLL-directed therapy   LOS: 3 days   Ardath Sax, MD   04/25/2017, 2:28 PM

## 2017-04-25 NOTE — Evaluation (Signed)
Physical Therapy Evaluation Patient Details Name: [name] MRN: [id number] DOB: March 05, 1929 Today's Date: 04/25/2017   History of Present Illness  [name] is a 84 y.o. female with medical history significant for CLL, HTN, glaucoma, presenting with worsening shortness of breath and dyspnea on exertion.  Clinical Impression  Patient presents with decreased independence with mobility due to LE weakness, decreased activity tolerance with new oxygen requirement.  She desaturated on 2L O2 with ambulation to 86%, back up to 90% with 1 min rest.  Will benefit from skilled PT in the acute setting to allow return home with family support and follow up HHPT.     Follow Up Recommendations Home health PT    Equipment Recommendations  None recommended by PT    Recommendations for Other Services       Precautions / Restrictions Precautions Precautions: Fall      Mobility  Bed Mobility               General bed mobility comments: sitting EOB  Transfers Overall transfer level: Modified independent Equipment used: None                Ambulation/Gait Ambulation/Gait assistance: Supervision Ambulation Distance (Feet): 200 Feet Assistive device: Rolling walker (2 wheeled) Gait Pattern/deviations: Step-through pattern;Decreased stride length     General Gait Details: decreased safety on turns, cues for technique,   Stairs            Wheelchair Mobility    Modified Rankin (Stroke Patients Only)       Balance Overall balance assessment: Needs assistance   Sitting balance-Leahy Scale: Good       Standing balance-Leahy Scale: Fair Standing balance comment: no UE support initially, mildly unsteady with gait                             Pertinent Vitals/Pain Pain Assessment: No/denies pain    Home Living Family/patient expects to be discharged to:: Private residence Living Arrangements: Spouse/significant other Available Help at  Discharge: Family Type of Home: House Home Access: Stairs to enter Entrance Stairs-Rails: Psychiatric nurse of Steps: 4 Home Layout: Two level Home Equipment: Environmental consultant - 2 wheels;Hospital bed      Prior Function Level of Independence: Independent         Comments: no longer drives     Hand Dominance        Extremity/Trunk Assessment   Upper Extremity Assessment Upper Extremity Assessment: Overall WFL for tasks assessed    Lower Extremity Assessment Lower Extremity Assessment: Generalized weakness    Cervical / Trunk Assessment Cervical / Trunk Assessment: Kyphotic  Communication   Communication: No difficulties  Cognition Arousal/Alertness: Awake/alert Behavior During Therapy: WFL for tasks assessed/performed Overall Cognitive Status: Within Functional Limits for tasks assessed                                        General Comments      Exercises     Assessment/Plan    PT Assessment Patient needs continued PT services  PT Problem List Decreased strength;Decreased mobility;Decreased balance;Decreased knowledge of use of DME       PT Treatment Interventions DME instruction;Functional mobility training;Balance training;Patient/family education;Gait training;Stair training;Therapeutic exercise;Therapeutic activities    PT Goals (Current goals can be found in the Care Plan section)  Acute Rehab PT  Goals Patient Stated Goal: To go home PT Goal Formulation: With patient Time For Goal Achievement: 05/02/17 Potential to Achieve Goals: Good    Frequency Min 3X/week   Barriers to discharge        Co-evaluation               AM-PAC PT "6 Clicks" Daily Activity  Outcome Measure Difficulty turning over in bed (including adjusting bedclothes, sheets and blankets)?: None Difficulty moving from lying on back to sitting on the side of the bed? : None Difficulty sitting down on and standing up from a chair with arms (e.g.,  wheelchair, bedside commode, etc,.)?: None Help needed moving to and from a bed to chair (including a wheelchair)?: A Little Help needed walking in hospital room?: A Little Help needed climbing 3-5 steps with a railing? : A Little 6 Click Score: 21    End of Session Equipment Utilized During Treatment: Gait belt Activity Tolerance: Patient tolerated treatment well Patient left: in chair;with call bell/phone within reach   PT Visit Diagnosis: Muscle weakness (generalized) (M62.81);Other abnormalities of gait and mobility (R26.89)    Time: 1210-1230 PT Time Calculation (min) (ACUTE ONLY): 20 min   Charges:   PT Evaluation $PT Eval Moderate Complexity: 1 Mod     PT G CodesMagda Kiel, [place] [phone] 04/25/2017   Reginia Naas 04/25/2017, 1:11 PM

## 2017-04-25 NOTE — Progress Notes (Addendum)
Progress Note  Patient Name: [name] Date of Encounter: 04/25/2017  Primary Cardiologist: New to Dr. Radford Pax this admission  Subjective   Breathing feels "so so," mild improvement from yesterday. Yesterday she felt a bandlike sensation around her whole chest - this tends to happen after she eats. No specific pain. Edema improved.  Inpatient Medications    Scheduled Meds: . doxycycline  100 mg Oral Q12H  . enoxaparin (LOVENOX) injection  40 mg Subcutaneous Q24H  . fluticasone  1 spray Each Nare Daily  . furosemide  40 mg Intravenous BID  . guaiFENesin  600 mg Oral BID  . magnesium oxide  400 mg Oral Daily  . pantoprazole  20 mg Oral Daily  . prednisoLONE acetate  1 drop Right Eye BID PC   Continuous Infusions:  PRN Meds: acetaminophen   Vital Signs    Vitals:   04/24/17 0447 04/24/17 1335 04/24/17 2000 04/25/17 0458  BP: 138/67 134/74 (!) 131/58 (!) 137/57  Pulse: 72 67 61 68  Resp: 16 16 16 17   Temp: 98.3 F (36.8 C) 99.1 F (37.3 C) 98.3 F (36.8 C) 98.2 F (36.8 C)  TempSrc: Oral Oral Oral Oral  SpO2: 97% 95% 96% 93%  Weight: 167 lb 12.3 oz (76.1 kg)   175 lb 11.3 oz (79.7 kg)  Height:        Intake/Output Summary (Last 24 hours) at 04/25/2017 9767 Last data filed at 04/24/2017 2200 Gross per 24 hour  Intake 480 ml  Output 1300 ml  Net -820 ml   Filed Weights   04/23/17 1500 04/24/17 0447 04/25/17 0458  Weight: 179 lb 3.7 oz (81.3 kg) 167 lb 12.3 oz (76.1 kg) 175 lb 11.3 oz (79.7 kg)    Telemetry    N/a - Personally Reviewed  Physical Exam   GEN: No acute distress.  HEENT: Normocephalic, atraumatic, sclera non-icteric. Neck: No JVD or bruits. Cardiac: RRR 3/6 SEM RUSB no rubs or gallops.  Radials/DP/PT 1+ and equal bilaterally.  Respiratory: mildly diminished at bases but otherwise clear to auscultation bilaterally. Breathing is unlabored. GI: Soft, nontender, non-distended, BS +x 4. MS: significant kyphosis Extremities: No clubbing  or cyanosis. Trace RLE edema. Distal pedal pulses are 2+ and equal bilaterally. Neuro:  AAOx3. Follows commands. Psych:  Responds to questions appropriately with a normal affect.  Labs    Chemistry Recent Labs  Lab 04/22/17 1554 04/23/17 0127 04/24/17 0601 04/25/17 0553  NA 136 137 137 134*  K 3.9 3.4* 3.9 3.9  CL 103 102 101 97*  CO2 28 30 32 32  GLUCOSE 92 142* 89 94  BUN 15 13 15 19   CREATININE 0.65 0.56 0.66 0.74  CALCIUM 10.0 9.4 9.0 9.0  PROT 6.1*  --   --   --   ALBUMIN 4.0  --   --   --   AST 14*  --   --   --   ALT 13*  --   --   --   ALKPHOS 47  --   --   --   BILITOT 1.1  --   --   --   GFRNONAA >60 >60 >60 >60  GFRAA >60 >60 >60 >60  ANIONGAP 5 5 4* 5     Hematology Recent Labs  Lab 04/23/17 0127 04/24/17 0601 04/25/17 0553  WBC 12.5* 11.0* 11.8*  RBC 3.93 3.74* 3.85*  HGB 9.7* 9.2* 9.3*  HCT 31.5* 30.0* 31.0*  MCV 80.2 80.2 80.5  MCH 24.7*  24.6* 24.2*  MCHC 30.8 30.7 30.0  RDW 16.3* 16.6* 16.6*  PLT 90* 97* 80*    Cardiac Enzymes Recent Labs  Lab 04/22/17 1547  TROPONINI <0.03   No results for input(s): TROPIPOC in the last 168 hours.   BNP Recent Labs  Lab 04/22/17 1554  BNP 150.9*     DDimer No results for input(s): DDIMER in the last 168 hours.   Radiology    No results found.  Cardiac Studies   2D echo 04/23/17 Study Conclusions  - Left ventricle: The cavity size was normal. Wall thickness was   increased in a pattern of moderate LVH. Systolic function was   normal. The estimated ejection fraction was in the range of 55%   to 60%. Doppler parameters are consistent with abnormal left   ventricular relaxation (grade 1 diastolic dysfunction). - Aortic valve: Mildly to moderately calcified annulus. Moderately   thickened leaflets. There was moderate stenosis. Mean gradient   (S): 25 mm Hg. Valve area (VTI): 1.43 cm^2. Valve area (Vmax):   1.39 cm^2. Valve area (Vmean): 1.36 cm^2. - Mitral valve: Mildly to moderately  calcified annulus. Mildly   thickened leaflets . - Left atrium: The atrium was moderately dilated. - Atrial septum: No defect or patent foramen ovale was identified. - Pericardium, extracardiac: Moderate left pleural effusion. Small   pericardial effusion adjacent to the LV.  Patient Profile     84 y.o. female with CLL (on ibrutinib from 07/2016), HTN, glaucoma who presented to [facility] with a 2 week history of worsening SOB and hypoxia as low as 64%, small to moderate bilateral pleural effusions. Also notes 2 month hx of nasal congestion. O2 sat at outpatient visit 01/2017 was 86%.  Assessment & Plan    1. Acute (?on chronic) hypoxic respiratory failure with pleural effusions - CT angio ruled out PE but did show bibasilar collapse/consolidation with small to moderate bilateral pleural effusions. While I agree there is likely a component of CHF at play, her profound hypoxia seems out of proportion to her minimally elevated BNP so I cannot be sure there isn't something else causing her effusions. Would continue diuresis for now. Agree with Dr. Theodosia Blender recommendation to consider underlying concomitant PNA given cough/mucus production. She is now currently on doxycycline per MAR. Imbruvica can also cause interstitial lung disease although this was not specifically noted on this admission's CT. Patient reports for the last several oncology visits, her initial O2 sat was noted to be low which does appear corroborated in the notes back to November (86%). Question whether this recently-chronic hypoxia could be contributing to some pulmonary HTN.  2. Acute diastolic CHF - tolerating diuresis thus far, would continue through today and reassess tomorrow. Weights are variable in Epic, but RLE edema is improving along with some symptomatic improvement in dyspnea. LE duplex neg for DVT. I would suggest cardiac monitoring with telemetry since she is being diuresed. Have placed order.  3. Coronary calcifications on CT:  Dr. Radford Pax also recommended to consider nuclear stress test once CHF is resolved to rule out ischemia given RBBB and coronary calcifications on CT; will review timing with Dr. Acie Fredrickson. Check lipids in AM. Not currently on aspirin, could be considered when OK with IM - currently has downtrending platelets.  4. Small pericardial effusion - no evidence of hemodynamic compromise; would follow for now.  5. CLL - per IM, who plans to review with oncology.  For questions or updates, please contact [place] Please consult [email or web address] for  contact info under Cardiology/STEMI.  Signed, Charlie Pitter, PA-C 04/25/2017, 9:22 AM    Attending Note:   The patient was seen and examined.  Agree with assessment and plan as noted above.  Changes made to the above note as needed.  Patient seen and independently examined with Melina Copa, PA .   We discussed all aspects of the encounter. I agree with the assessment and plan as stated above.  1.  Acute shortness of breath: Suspect most of her shortness of breath is from a pulmonary etiology.  We cannot exclude the possibility that she has some degree of congestive heart failure but I think it that is probably a small component.  She has normal left ventricular systolic function.  She has grade 1 diastolic dysfunction and moderate aortic stenosis. She is diuresing quite nicely.    I have spent a total of 40 minutes with patient reviewing hospital  notes , telemetry, EKGs, labs and examining patient as well as establishing an assessment and plan that was discussed with the patient. > 50% of time was spent in direct patient care.    Thayer Headings, Brooke Bonito., MD, [facility] 04/25/2017, 12:31 PM [address]  [place] Pager [phone]

## 2017-04-25 NOTE — Care Management Important Message (Signed)
Important Message  Patient Details  Name: SHARILYNN CASSITY MRN: [id number] Date of Birth: 1928/09/05   Medicare Important Message Given:  Yes    Kerin Salen 04/25/2017, 12:00 [place] Message  Patient Details  Name: LAINEY NELSON MRN: [id number] Date of Birth: June 20, 1928   Medicare Important Message Given:  Yes    Kerin Salen 04/25/2017, 12:00 PM

## 2017-04-25 NOTE — Progress Notes (Signed)
SATURATION QUALIFICATIONS: (This note is used to comply with regulatory documentation for home oxygen)  Patient Saturations on Room Air at Rest = 86%  Patient Saturations on Room Air while Ambulating = NT%  Patient Saturations on 2 Liters of oxygen while Ambulating = 86%  Please briefly explain why patient needs home oxygen:  Patient hypoxic on room air at rest and with ambulation on O2.  [place], [place] 04/25/2017

## 2017-04-26 ENCOUNTER — Encounter (HOSPITAL_COMMUNITY): Payer: Self-pay

## 2017-04-26 DIAGNOSIS — R1310 Dysphagia, unspecified: Secondary | ICD-10-CM

## 2017-04-26 DIAGNOSIS — I272 Pulmonary hypertension, unspecified: Secondary | ICD-10-CM

## 2017-04-26 DIAGNOSIS — D696 Thrombocytopenia, unspecified: Secondary | ICD-10-CM

## 2017-04-26 DIAGNOSIS — R0902 Hypoxemia: Secondary | ICD-10-CM

## 2017-04-26 DIAGNOSIS — C9110 Chronic lymphocytic leukemia of B-cell type not having achieved remission: Secondary | ICD-10-CM

## 2017-04-26 DIAGNOSIS — Z79899 Other long term (current) drug therapy: Secondary | ICD-10-CM

## 2017-04-26 DIAGNOSIS — D649 Anemia, unspecified: Secondary | ICD-10-CM

## 2017-04-26 LAB — BASIC METABOLIC PANEL
ANION GAP: 6 (ref 5–15)
BUN: 20 mg/dL (ref 6–20)
CO2: 35 mmol/L — ABNORMAL HIGH (ref 22–32)
Calcium: 9.5 mg/dL (ref 8.9–10.3)
Chloride: 92 mmol/L — ABNORMAL LOW (ref 101–111)
Creatinine, Ser: 0.62 mg/dL (ref 0.44–1.00)
GFR calc Af Amer: >60 mL/min (ref >60)
GFR calc non Af Amer: >60 mL/min (ref >60)
GLUCOSE: 103 mg/dL — AB (ref 65–99)
POTASSIUM: 4 mmol/L (ref 3.5–5.1)
Sodium: 133 mmol/L — ABNORMAL LOW (ref 135–145)

## 2017-04-26 LAB — CBC WITH DIFFERENTIAL/PLATELET
Basophils Absolute: 0 10*3/uL (ref 0.0–0.1)
Basophils Relative: 0 %
EOS PCT: 1 %
Eosinophils Absolute: 0.1 10*3/uL (ref 0.0–0.7)
HEMATOCRIT: 32.8 % — AB (ref 36.0–46.0)
Hemoglobin: 10 g/dL — ABNORMAL LOW (ref 12.0–15.0)
LYMPHS PCT: 75 %
Lymphs Abs: 11.2 10*3/uL — ABNORMAL HIGH (ref 0.7–4.0)
MCH: 24.3 pg — AB (ref 26.0–34.0)
MCHC: 30.5 g/dL (ref 30.0–36.0)
MCV: 79.8 fL (ref 78.0–100.0)
MONO ABS: 0.6 10*3/uL (ref 0.1–1.0)
Monocytes Relative: 4 %
NEUTROS ABS: 3 10*3/uL (ref 1.7–7.7)
NEUTROS PCT: 20 %
Platelets: 109 10*3/uL — ABNORMAL LOW (ref 150–400)
RBC: 4.11 MIL/uL (ref 3.87–5.11)
RDW: 16.5 % — ABNORMAL HIGH (ref 11.5–15.5)
WBC: 14.9 10*3/uL — ABNORMAL HIGH (ref 4.0–10.5)

## 2017-04-26 LAB — BILIRUBIN, FRACTIONATED(TOT/DIR/INDIR)
Bilirubin, Direct: 0.1 mg/dL (ref 0.1–0.5)
Indirect Bilirubin: 0.4 mg/dL (ref 0.3–0.9)
Total Bilirubin: 0.5 mg/dL (ref 0.3–1.2)

## 2017-04-26 LAB — LIPID PANEL
Cholesterol: 140 mg/dL (ref 0–200)
HDL: 66 mg/dL (ref >40)
LDL CALC: 66 mg/dL (ref 0–99)
TRIGLYCERIDES: 42 mg/dL (ref <150)
Total CHOL/HDL Ratio: 2.1 RATIO
VLDL: 8 mg/dL (ref 0–40)

## 2017-04-26 LAB — LACTATE DEHYDROGENASE: LDH: 104 U/L (ref 98–192)

## 2017-04-26 LAB — MAGNESIUM: Magnesium: 1.9 mg/dL (ref 1.7–2.4)

## 2017-04-26 MED ORDER — PANTOPRAZOLE SODIUM 40 MG PO PACK
40.0000 mg | PACK | Freq: Every day | ORAL | Status: DC
  Administered 2017-04-26 – 2017-04-27 (×2): 40 mg via ORAL
  Filled 2017-04-26 (×3): qty 20

## 2017-04-26 MED ORDER — GUAIFENESIN 100 MG/5ML PO SOLN
5.0000 mL | Freq: Two times a day (BID) | ORAL | Status: DC
  Administered 2017-04-26 – 2017-04-27 (×3): 100 mg via ORAL
  Filled 2017-04-26 (×3): qty 10

## 2017-04-26 MED ORDER — SALINE SPRAY 0.65 % NA SOLN
1.0000 | NASAL | Status: DC | PRN
  Filled 2017-04-26: qty 44

## 2017-04-26 NOTE — Progress Notes (Signed)
[id number]/POEUMP Davis,BSN,RN3,[phone]./hhc setup through [facility].

## 2017-04-26 NOTE — Progress Notes (Signed)
I very much appreciate Dr. Lebron Conners seen [name] in my absence yesterday.  She has some hypoxia.  She had a CT angiogram which was negative for any pulmonary embolism.  She is on IMBRUVICA.  I would be surprised that this would cause her problems.  IMBRUVICA typically has been known to cause atrial fibrillation.  I suppose she could have coronary artery disease given her age.  Her CBC today shows a white cell count 14.9.  Hemoglobin 10.  Platelet count 109,000.  She still has a lymphocytosis.  There is no adenopathy on exam.  On her CT angiogram, there is no adenopathy that was noted.  She is wearing supplemental oxygen right now.  The IMBRUVICA is on hold.  She has been on IMBRUVICA for several months.  To date, she has had no problems with IMBRUVICA.  She had an echocardiogram which did not show [place] any obvious systolic dysfunction.   On her physical exam, her vital signs show temperature of 98.3.  Pulse 62.  Blood pressure 144/66.  Oxygen saturation is 94% on 2 L.  Her head exam shows no ocular or oral lesions.  She has no adenopathy in the neck.  Lungs are with some decreased breath sounds at the bases.  She has some wheezing bilaterally.  Cardiac exam regular rate and rhythm.  She has no irregular rhythm.  She has no murmurs.  Abdomen is soft.  She is slightly obese.  She has no fluid wave.  There is no palpable liver or spleen tip.  Extremities shows some chronic trace edema in her legs.  Neurological exam shows no focal neurological deficits.   [name] is an 84 year old white female with CLL.  She is on IMBRUVICA.  Her white cell count has come down quite nicely.  Her adenopathy has resolved.  It is hard to prove that [place] is the cause of all of this.  I do not believe that IMBRUVICA is the cause.  I think that because of her age, she just has cardiac risk factors that can precipitate an event.  However, I do not have any problems with her coming off [place] for right  now.  This is all about quality of life.  I am not going to cure the CLL.  I want to make sure that her quality of life is as good as possible, given her age.  I know the staff up on [address] are doing a tremendous job taking care of her.  Lattie Haw, MD  Philippians 4:19

## 2017-04-26 NOTE — Plan of Care (Signed)
  Safety: Ability to remain free from injury will improve 04/26/2017 0241 - Progressing by Mickie Kay, RN

## 2017-04-26 NOTE — Progress Notes (Addendum)
PROGRESS NOTE  [name] PPI:[id number] DOB: [birth year] DOA: 04/22/2017 PCP: Shirline Frees, MD  Brief summary:   H/o HTN, cll started on ibrutinib in 07/2016 Noticed nasal drainage, progressive orthopnea, DOE, bilateral lower extremity edema No fever, denies chest pain  HPI/Recap of past 24 hours:  Urine output was not measured last 24hrs, weight is down from 81kg (179lb) on admission to 76kg (169lb) today  Sitting up on the edge of the bed, reports feeling better,    her main complaints are nasal congestion and nasal drainage.    Assessment/Plan: Active Problems:   CLL (chronic lymphocytic leukemia) (HCC)   Acute respiratory failure with hypoxia (HCC)   Chronic hypertension   Glaucoma   Acute diastolic CHF (congestive heart failure) (HCC)   Anemia   Thrombocytopenia (HCC)   Acute respiratory failure (HCC)   RBBB   Nonrheumatic aortic valve stenosis  Acute on chronic hypoxic respiratory failure/ acute diastolic chf, pulmonary edema, bilateral pleural effusion -per outpatient oncology notes she has hypoxia in their clinic in 01/2017 as well -on presentation this hospitalization, she is found to have SAts  86% on room air. She is placed on 2 liters of o2 with increase to 96%  -CTA chest no PE, + Pulmonary arterial enlargement suggests pulmonary arterial hypertension, +Bibasilar collapse/consolidation with small to moderate bilateral pleural effusions.Coronary artery and Aortic Atherosclerois.   -Echocardiogram with  lvef 06%, grade 1 diastolic dysfunction, moderate aortic stenosis, moderately dilated left atrium, small pericardial effusion adjacent to LV. Pulmonary artery systolic pressure could not be estimated. Dilated ivc. -d/c norvasc, hold cozzar for now to leave room for diuresis, fluids restriction, daily weight, strict I/o's --cardiotoxicity from ibrutinib could also be on differential , case discussed with oncology Dr Lebron Conners who is covering Dr Marin Olp today on  1/28. ibrutinib on hold for now. She is to follow up with oncology outpateint to discuss cll treatment.  -US guided  thoracentesis for fluids analysis/cytology now that platelet number is improving. -oncology and cardiology consulted, input appreciated  RN reports patient appear to have significant hypoxia during sleep, cpap ordered, but patient could not tolerate, continue o2 supplement Consider outpatient sleep study.   Bilateral lower extremity edema, R>L Right lower extremity Venous doppler negative for DVT ibrutinib on hold per oncology Elevate leg, fluids restriction, diuresis  Hypokalemia/hypomagnesemia: replace k, mag   Dysphagia:  MBS concerns for esophageal phase issue.  DG esophagus: "Presbyesophagus. Poor primary esophageal stripping wave. Prominent tertiary wave contractions noted throughout the exam. Moderate smooth narrowing at the gastroesophageal junction. Patient had difficulty propelling ingested 13 mm barium tablet which had to be dissolved with warm water."  Crush pills, [facility] consulted. Case discussed with Nicoletta Ba on 1/29.  CLL, started on ibrutinib from 07/2016 Mild Leukocytosis with above normal range of lymphocyte  Normocytic anemia, hgb close to baseline Thrombocytopenia, seems to be progressive, could be due to heart failure, will monitor  case discussed with  Oncologist on call Dr Lebron Conners, hold ibrutinib for now, details please refer to his note.  Significant nasal congestion, drainage for several months ibrutinib could contribute to the symptom, hold ibrutinib Denies headache Nasal spray, mucinex Start oral doxycycline Consider outpatient ENT referral if symptom persists.   Glaucoma: continue eyedrops.  Body mass index is 29.95 kg/m.   Code Status: full  Family Communication: patient   Disposition Plan: home with home health and home o2 on 1/30 after thoracentesis    Consultants:  cardiology  Oncology  GI  Procedures:  DG  esophagus on 1/28  Therapeutic and diagnostic thoracentesis on 1/29  Antibiotics:  Doxycycline from 1/27   Objective: BP (!) 144/66 (BP Location: Right Arm)   Pulse 62   Temp 98.3 F (36.8 C) (Oral)   Resp 19   Ht 5\' 3"  (1.6 m)   Wt 76.7 kg (169 lb 1.5 oz)   SpO2 94%   BMI 29.95 kg/m   Intake/Output Summary (Last 24 hours) at 04/26/2017 0830 Last data filed at 04/25/2017 1900 Gross per 24 hour  Intake 70 ml  Output -  Net 70 ml   Filed Weights   04/24/17 0447 04/25/17 0458 04/26/17 0803  Weight: 76.1 kg (167 lb 12.3 oz) 79.7 kg (175 lb 11.3 oz) 76.7 kg (169 lb 1.5 oz)    Exam: Patient is examined daily including today on 04/26/2017, exams remain the same as of yesterday except that has changed    General:  NAD  Cardiovascular: RRR, + 3/6 systolic murmur at right upper sternal border  Respiratory: diminished at basis, bibasilar crackles, no wheezing, no rhonchi  Abdomen: Soft/ND/NT, positive BS  Musculoskeletal: bilateral pitting Edema R>L  Neuro: alert, oriented   Data Reviewed: Basic Metabolic Panel: Recent Labs  Lab 04/22/17 1554 04/23/17 0127 04/24/17 0601 04/25/17 0553 04/26/17 0619  NA 136 137 137 134* 133*  K 3.9 3.4* 3.9 3.9 4.0  CL 103 102 101 97* 92*  CO2 28 30 32 32 35*  GLUCOSE 92 142* 89 94 103*  BUN 15 13 15 19 20   CREATININE 0.65 0.56 0.66 0.74 0.62  CALCIUM 10.0 9.4 9.0 9.0 9.5  MG  --   --  1.7 1.6* 1.9   Liver Function Tests: Recent Labs  Lab 04/22/17 1554  AST 14*  ALT 13*  ALKPHOS 47  BILITOT 1.1  PROT 6.1*  ALBUMIN 4.0   No results for input(s): LIPASE, AMYLASE in the last 168 hours. No results for input(s): AMMONIA in the last 168 hours. CBC: Recent Labs  Lab 04/22/17 1554 04/23/17 0127 04/24/17 0601 04/25/17 0553 04/26/17 0619  WBC 15.0* 12.5* 11.0* 11.8* 14.9*  NEUTROABS  --  3.4 2.3 2.6 3.0  HGB 10.4* 9.7* 9.2* 9.3* 10.0*  HCT 34.2* 31.5* 30.0* 31.0* 32.8*  MCV 80.5 80.2 80.2 80.5 79.8  PLT 97* 90* 97*  80* 109*   Cardiac Enzymes:   Recent Labs  Lab 04/22/17 1547  TROPONINI <0.03   BNP (last 3 results) Recent Labs    04/22/17 1554  BNP 150.9*    ProBNP (last 3 results) No results for input(s): PROBNP in the last 8760 hours.  CBG: No results for input(s): GLUCAP in the last 168 hours.  No results found for this or any previous visit (from the past 240 hour(s)).   Studies: Dg Esophagus  Result Date: 04/25/2017 CLINICAL DATA:  84 year old female with dysphagia. Initial encounter. EXAM: ESOPHOGRAM/BARIUM SWALLOW TECHNIQUE: Single contrast examination was performed using  thin barium. FLUOROSCOPY TIME:  Fluoroscopy Time:  2 minutes and 30 seconds Radiation Exposure Index: 30 mGy. COMPARISON:  None. FINDINGS: Patient is status post modified barium swallow earlier in the day. Exam had to be tailored to patient who had difficulty swallowing in the semi erect position. Patient not stable on her feet. Secondary to nauseous exam was terminated prior to obtaining oblique or lateral views. Poor primary esophageal stripping wave. Prominent tertiary wave contractions noted throughout the exam. Moderate smooth narrowing at the gastroesophageal junction. Patient had difficulty propelling ingested 13 mm barium tablet  which had to be dissolved with warm water. IMPRESSION: Presbyesophagus. Poor primary esophageal stripping wave. Prominent tertiary wave contractions noted throughout the exam. Moderate smooth narrowing at the gastroesophageal junction. Patient had difficulty propelling ingested 13 mm barium tablet which had to be dissolved with warm water. Electronically Signed   By: Genia Del M.D.   On: 04/25/2017 14:49   Dg Swallowing Func-speech Pathology  Result Date: 04/25/2017 Objective Swallowing Evaluation: Type of Study: MBS-Modified Barium Swallow Study  Patient Details Name: [name] MRN: [id number] Date of Birth: [birth year] Today's Date: 04/25/2017 Time: SLP Start Time (ACUTE ONLY): 1050  -SLP Stop Time (ACUTE ONLY): 1110 SLP Time Calculation (min) (ACUTE ONLY): 20 min Past Medical History: Past Medical History: Diagnosis Date . Arthritis  . CHF exacerbation ([place]) 04/22/2017 . CLL (chronic lymphocytic leukemia) ([place]) 11/10/2012 . Hypertension  . Knee pain, left  . Total knee replacement status  Past Surgical History: Past Surgical History: Procedure Laterality Date . APPENDECTOMY   . carpel tunnel surgery    bilateral . CHOLECYSTECTOMY   . COLON SURGERY   . HERNIA REPAIR    left . JOINT REPLACEMENT    Bilateral HPI: Bridget Bamberg Dieringis a 84 y.o.femalewith medical history significant forCLL, HTN, glaucoma, presenting with cough and dyspnea.  Patient reporting that for 2 months has had daily sinus drainage that is very bothersome. She has also developed a cough that is productive in the morning. For the past two weeks has developed worsening shortness of breath and dyspnea on exertion. She thinks legs are a bit more swollen than normal, and also has worsening orthopnea. No fevers. No sick contacts. No vomiting or diarrhea. No chest pain.  Chest xray is showing bibasilar densities suggestive of atelectasis and small bilateral pleural effusions.  CT angio of the chest is showing bibasilar collapse/consolidation with small to moderate bilateral pleural effusions.   Subjective: pt awake in chair Assessment / Plan / Recommendation CHL IP CLINICAL IMPRESSIONS 04/24/2017 Clinical Impression Pt with functional oropharyngeal swallow with no aspiration/penetration nor significant oropharyngeal residuals.  Upon esophageal sweep, pt's distal esophagus appeared with retention - with barium tablet lodging near GE requiring more liquids to transit into stomach. Pt with referrant sensation of residuals to pharynx during MBS x2.  Above appearance of esophageal tapering, esophagus appeared dilated/wide - no radiologist present to confirm findings.  Suspect primary esophageal dysphagia based on findings.  Advised pt to  mitigation strategies - she reports issues with "post-nasal drip" and denies significant deficits with swallowing.  Consideration for dedicated esophagram may be beneficial to elucidate.  Thanks for this consult.  SLP Visit Diagnosis Dysphagia, unspecified (R13.10) Attention and concentration deficit following -- Frontal lobe and executive function deficit following -- Impact on safety and function Mild aspiration risk   No flowsheet data found.  Prognosis 04/25/2017 Prognosis for Safe Diet Advancement Good Barriers to Reach Goals Other (Comment) Barriers/Prognosis Comment -- CHL IP DIET RECOMMENDATION 04/24/2017 SLP Diet Recommendations Regular solids;Thin liquid Liquid Administration via Cup;Straw Medication Administration Whole meds with liquid Compensations Slow rate;Small sips/bites Postural Changes Remain semi-upright after after feeds/meals (Comment);Seated upright at 90 degrees   CHL IP OTHER RECOMMENDATIONS 04/24/2017 Recommended Consults Consider esophageal assessment Oral Care Recommendations Oral care BID Other Recommendations --   CHL IP FOLLOW UP RECOMMENDATIONS 04/24/2017 Follow up Recommendations None   No flowsheet data found.     CHL IP ORAL PHASE 04/25/2017 Oral Phase WFL Oral - Pudding Teaspoon -- Oral - Pudding Cup -- Oral - Honey Teaspoon --  Oral - Honey Cup -- Oral - Nectar Teaspoon -- Oral - Nectar Cup WFL Oral - Nectar Straw -- Oral - Thin Teaspoon -- Oral - Thin Cup WFL Oral - Thin Straw WFL Oral - Puree WFL;Piecemeal swallowing Oral - Mech Soft -- Oral - Regular WFL Oral - Multi-Consistency -- Oral - Pill WFL Oral Phase - Comment --  CHL IP PHARYNGEAL PHASE 04/25/2017 Pharyngeal Phase WFL Pharyngeal- Pudding Teaspoon -- Pharyngeal -- Pharyngeal- Pudding Cup -- Pharyngeal -- Pharyngeal- Honey Teaspoon -- Pharyngeal -- Pharyngeal- Honey Cup -- Pharyngeal -- Pharyngeal- Nectar Teaspoon -- Pharyngeal -- Pharyngeal- Nectar Cup WFL Pharyngeal -- Pharyngeal- Nectar Straw -- Pharyngeal -- Pharyngeal-  Thin Teaspoon -- Pharyngeal -- Pharyngeal- Thin Cup WFL Pharyngeal -- Pharyngeal- Thin Straw WFL Pharyngeal -- Pharyngeal- Puree WFL Pharyngeal -- Pharyngeal- Mechanical Soft -- Pharyngeal -- Pharyngeal- Regular WFL Pharyngeal -- Pharyngeal- Multi-consistency -- Pharyngeal -- Pharyngeal- Pill WFL Pharyngeal -- Pharyngeal Comment --  CHL IP CERVICAL ESOPHAGEAL PHASE 04/25/2017 Cervical Esophageal Phase WFL Pudding Teaspoon -- Pudding Cup -- Honey Teaspoon -- Honey Cup -- Nectar Teaspoon -- Nectar Cup -- Nectar Straw -- Thin Teaspoon -- Thin Cup -- Thin Straw -- Puree -- Mechanical Soft -- Regular -- Multi-consistency -- Pill -- Cervical Esophageal Comment -- No flowsheet data found. Luanna Salk, MS [facility] SLP [phone]               Scheduled Meds: . doxycycline  100 mg Oral Q12H  . enoxaparin (LOVENOX) injection  40 mg Subcutaneous Q24H  . fluticasone  1 spray Each Nare Daily  . furosemide  40 mg Intravenous BID  . guaiFENesin  600 mg Oral BID  . magnesium oxide  400 mg Oral Daily  . pantoprazole  20 mg Oral Daily  . prednisoLONE acetate  1 drop Right Eye BID PC  . senna-docusate  1 tablet Oral BID    Continuous Infusions:   Time spent: 35 mins, case discussed with cardiology and GI.  I have personally reviewed and interpreted on  04/26/2017 daily labs, tele strips, imagings as discussed above under date review session and assessment and plans.  I reviewed all nursing notes, consult notes, vitals, pertinent old records.   I have discussed plan of care as described above with RN , patient on 04/26/2017   Florencia Reasons MD, PhD  [facility] Pager [phone]. If 7PM-7AM, please contact night-coverage at [email or web address], password [facility] 04/26/2017, 8:30 AM  LOS: 4 days

## 2017-04-26 NOTE — Consult Note (Signed)
[facility] Consult: 2:37 PM 04/26/2017  LOS: 4 days    Referring Provider: Dr Erlinda Hong.    Primary Care Physician:  Shirline Frees, MD Primary Gastroenterologist:  Dr. Richmond Campbell (who declines to see his patients when they are admitted to [facility]).    Reason for Consultation: Dysphagia   HPI: [name] is a 84 y.o. female.  Hx CLL.  Dr Marin Olp is overseeing palliative systemic therapy with ibrutinib since 07/2016. Remote history of partial colectomy for colon cancer "30 years ago".  Remote appendectomy for rupture appendix. 2002 open cholecystectomy.   09/1999 colon path report: Tubular adenoma, no HGD.  Chronic active colitis with epithelial atypia, indeterminant for dysplasia. Patient says she had a colonoscopy in 2017 or 2018 but Dr [facility] office note of Feb 2018 states "CRC screening not anticipated given patient's age" and documents last colonoscopy dated 01/2010 Patient denies previous upper endoscopy or problems with swallowing  Admitted 4 days ago with progressive dyspnea and hypoxia.  Ct angio chest 04/22/17: sugg of pulmonary artery htn.  Small pericardial effusion, CM, bil pleural effusions and bibasilar consolidation/collapse.  Aortic and coronary atherosclerosis.  Doppler studies of the lower extremity negative for DVT. Working diagnosis is acute on chronic hypoxic respiratory failure with acute diastolic heart failure.  For about 2 weeks patient has been having a phenomenon of thick, tenacious nasal secretions which drip retrograde into the back of the throat and down into the esophagus.  The secretions build up in the esophagus and cause her to cough and choke.  The problem is at its worse in the mornings.  The only strategy she has found useful for resolving the issue is to lean  over with her head towards her feet, cough and choke and the sensation subsides.  She denies problems swallowing her meals, solids and liquids included. 04/25/17 MBS swallow study: Functional oropharyngeal swallow without aspiration/penetration or significant oropharyngeal residuals.  Distal esophagus with retention; barium tablet lodged at GE junction requiring liquids to transit tablet into the stomach.  Esophagus appeared dilated and wide.  Suspect primary esophageal dysphagia. Esophagram 04/25/17: presbyesophagus.  Poor primary esophageal stripping wave.  Prominent tertiary contractions.  Moderate, smooth narrowing at GE junction difficulty propelling 13 mm tablet which had to be dissolved with warm water.    Past Medical History:  Diagnosis Date  . Arthritis   . CHF exacerbation ([place]) 04/22/2017  . CLL (chronic lymphocytic leukemia) ([place]) 11/10/2012  . Hypertension   . Knee pain, left   . Total knee replacement status     Past Surgical History:  Procedure Laterality Date  . APPENDECTOMY    . BLEPHAROPLASTY  2003  . carpel tunnel surgery     bilateral  . CHOLECYSTECTOMY    . COLON SURGERY    . HERNIA REPAIR     left  . ORIF HIP FRACTURE Left 2003   original left hip fx and ORIF 06/2001.  s/p 10/2001 Interchange and open reduction internal fixation of hip screw.  s/p 12/2001 Hardware removal left hip.  Marland Kitchen TOTAL KNEE  ARTHROPLASTY Left 2008   Dr Maureen Ralphs.      Prior to Admission medications   Medication Sig Start Date End Date Taking? Authorizing Provider  amLODipine (NORVASC) 5 MG tablet Take 1 tablet (5 mg total) by mouth daily. 12/31/12  Yes Geradine Girt, DO  Ascorbic Acid (VITAMIN C PO) Take 1 tablet by mouth daily.   Yes [provider]  IMBRUVICA 420 MG TABS TAKE 1 TABLET BY MOUTH DAILY 03/30/17  Yes Ennever, Rudell Cobb, MD  losartan (COZAAR) 50 MG tablet Take 1 tablet (50 mg total) by mouth daily. 12/31/12  Yes Vann, Jessica U, DO  prednisoLONE acetate (PRED FORTE) 1 %  ophthalmic suspension Place 1 drop into the right eye 2 (two) times daily.  08/24/16  Yes [provider]    Scheduled Meds: . doxycycline  100 mg Oral Q12H  . fluticasone  1 spray Each Nare Daily  . guaiFENesin  5 mL Oral BID  . magnesium oxide  400 mg Oral Daily  . pantoprazole sodium  40 mg Oral Daily  . prednisoLONE acetate  1 drop Right Eye BID PC  . senna-docusate  1 tablet Oral BID   Infusions:  PRN Meds: acetaminophen, sodium chloride   Allergies as of 04/22/2017  . (No Known Allergies)    History reviewed. No pertinent family history.  Social History   Socioeconomic History  . Marital status: Married    Spouse name: Not on file  . Number of children: Not on file  . Years of education: Not on file  . Highest education level: Not on file  Social Needs  . Financial resource strain: Not on file  . Food insecurity - worry: Not on file  . Food insecurity - inability: Not on file  . Transportation needs - medical: Not on file  . Transportation needs - non-medical: Not on file  Occupational History  . Not on file  Tobacco Use  . Smoking status: Former Smoker    Packs/day: 1.00    Years: 28.00    Pack years: 28.00    Types: Cigarettes    Start date: 02/15/1946    Last attempt to quit: 12/16/1973    Years since quitting: 43.3  . Smokeless tobacco: Never Used  . Tobacco comment: quit 40 years ago  Substance and Sexual Activity  . Alcohol use: Yes    Alcohol/week: 0.0 oz  . Drug use: Not on file  . Sexual activity: Not on file  Other Topics Concern  . Not on file  Social History Narrative  . Not on file    REVIEW OF SYSTEMS: Constitutional: Weakness present at the time of admission has improved. ENT:  No nose bleeds Pulm: Did not tolerate CPAP. CV:  No palpitations, no LE edema.  Has been told she has heart murmur before. GU:  No hematuria, no frequency GI:  Per HPI Heme: No excessive or unusual bruising. Transfusions: No epic records of  previous blood product transfusions. Neuro:  No headaches, no peripheral tingling or numbness Derm:  No itching, no rash or sores.  Endocrine:  No sweats or chills.  No polyuria or dysuria Immunization: None. Travel:  None beyond local counties in last few months.    PHYSICAL EXAM: Vital signs in last 24 hours: Vitals:   04/26/17 0628 04/26/17 1404  BP: (!) 144/66 123/81  Pulse: 62 66  Resp: 19 18  Temp: 98.3 F (36.8 C) 98.2 F (36.8 C)  SpO2: 94% 99%   Wt Readings  from Last 3 Encounters:  04/26/17 76.7 kg (169 lb 1.5 oz)  03/10/17 78 kg (172 lb)  01/27/17 79.4 kg (175 lb)   General: Non-ill appearing, comfortable WF.  She appears younger than stated age. Head: No facial asymmetry or swelling.  No signs of head trauma. Eyes: No scleral icterus or conjunctival pallor.  EOMI. Ears: Slightly hard of hearing Nose: No discharge.  Does not sound congested. Mouth: Pink, clear oral mucosa.  Tongue midline. Neck: No JVD, no masses, no thyromegaly. Lungs: Clear bilaterally.  No labored breathing or cough. Heart: RRR.  2/6 systolic murmur. Abdomen: Soft.  Nontender, nondistended.  Active bowel sounds.  No masses, no surgical scars consistent with previous surgeries.   Rectal: Deferred Musc/Skeltl: No joint erythema or swelling.  Some arthritic changes in the fingers. Extremities: No CCE. Neurologic: Oriented x3.  Alert.  Moves all 4 limbs.  No tremor.  Limb strength not tested. Skin: No telangiectasia, no suspicious sores or lesions. Tattoos: None Nodes: No cervical adenopathy. Psych: Cooperative.  Intake/Output from previous day: 01/28 0701 - 01/29 0700 In: 70  Out: -  Intake/Output this shift: Total I/O In: 240 [P.O.:240] Out: 300 [Urine:300]  LAB RESULTS: Recent Labs    04/24/17 0601 04/25/17 0553 04/26/17 0619  WBC 11.0* 11.8* 14.9*  HGB 9.2* 9.3* 10.0*  HCT 30.0* 31.0* 32.8*  PLT 97* 80* 109*   BMET Lab Results  Component Value Date   NA 133 (L)  04/26/2017   NA 134 (L) 04/25/2017   NA 137 04/24/2017   K 4.0 04/26/2017   K 3.9 04/25/2017   K 3.9 04/24/2017   CL 92 (L) 04/26/2017   CL 97 (L) 04/25/2017   CL 101 04/24/2017   CO2 35 (H) 04/26/2017   CO2 32 04/25/2017   CO2 32 04/24/2017   GLUCOSE 103 (H) 04/26/2017   GLUCOSE 94 04/25/2017   GLUCOSE 89 04/24/2017   BUN 20 04/26/2017   BUN 19 04/25/2017   BUN 15 04/24/2017   CREATININE 0.62 04/26/2017   CREATININE 0.74 04/25/2017   CREATININE 0.66 04/24/2017   CALCIUM 9.5 04/26/2017   CALCIUM 9.0 04/25/2017   CALCIUM 9.0 04/24/2017   LFT Recent Labs    04/26/17 0949  BILITOT 0.5  BILIDIR 0.1  IBILI 0.4   PT/INR Lab Results  Component Value Date   INR 1.2 10/31/2006   INR 1.1 10/30/2006   INR 1.1 10/29/2006   Hepatitis Panel No results for input(s): HEPBSAG, HCVAB, HEPAIGM, HEPBIGM in the last 72 hours. C-Diff No components found for: CDIFF Lipase  No results found for: LIPASE  Drugs of Abuse  No results found for: LABOPIA, COCAINSCRNUR, LABBENZ, AMPHETMU, THCU, LABBARB   RADIOLOGY STUDIES: Dg Esophagus  Result Date: 04/25/2017 CLINICAL DATA:  84 year old female with dysphagia. Initial encounter. EXAM: ESOPHOGRAM/BARIUM SWALLOW TECHNIQUE: Single contrast examination was performed using  thin barium. FLUOROSCOPY TIME:  Fluoroscopy Time:  2 minutes and 30 seconds Radiation Exposure Index: 30 mGy. COMPARISON:  None. FINDINGS: Patient is status post modified barium swallow earlier in the day. Exam had to be tailored to patient who had difficulty swallowing in the semi erect position. Patient not stable on her feet. Secondary to nauseous exam was terminated prior to obtaining oblique or lateral views. Poor primary esophageal stripping wave. Prominent tertiary wave contractions noted throughout the exam. Moderate smooth narrowing at the gastroesophageal junction. Patient had difficulty propelling ingested 13 mm barium tablet which had to be dissolved with warm water.  IMPRESSION: Presbyesophagus. Poor primary  esophageal stripping wave. Prominent tertiary wave contractions noted throughout the exam. Moderate smooth narrowing at the gastroesophageal junction. Patient had difficulty propelling ingested 13 mm barium tablet which had to be dissolved with warm water. Electronically Signed   By: Genia Del M.D.   On: 04/25/2017 14:49   Dg Swallowing Func-speech Pathology  Result Date: 04/25/2017  MBS-Modified Barium Swallow Study   CLINICAL IMPRESSIONS 04/24/2017 Pt with functional oropharyngeal swallow with no aspiration/penetration nor significant oropharyngeal residuals.  Upon esophageal sweep, pt's distal esophagus appeared with retention - with barium tablet lodging near GE requiring more liquids to transit into stomach. Pt with referrant sensation of residuals to pharynx during MBS x2.  Above appearance of esophageal tapering, esophagus appeared dilated/wide - no radiologist present to confirm findings.  Suspect primary esophageal dysphagia based on findings.  Diet Recommendations Regular solids;Thin liquid Liquid Administration via Cup;Straw Medication Administration Whole meds with liquid Compensations Slow rate;Small sips/bites Postural Changes Remain semi-upright after after feeds/meals;Seated upright at 90 degrees    Luanna Salk, MS [facility] SLP [phone]               IMPRESSION:   *   Atypical dysphagia related only to which she feels are increased, viscous/tenacious nasal secretions.  Denies dysphagia with oral intake.  Modified barium study and barium esophagram demonstrates esophageal dysmotility esophagram shows narrowing distal esophagus.  *   Acute on chronic respiratory failure with diastolic CHF.  *   CLL.  Has been receiving immunotherapy since May 2018.  *   Remote colon cancer.  Status post partial colectomy ~30 years ago.  Last colonoscopy was in 2011.  Dr. Earlean Shawl has no plans for repeat screening.    *   Thrombocytopenia.  Dates back to  07/2016.  *   Normocytic anemia.  Current Hgb levels of ~ 10 improved from 07/2016 and stable c/w fall 2018 readings.     PLAN:     *  Per Dr Ardis Hughs.    Azucena Freed  04/26/2017, 2:37 PM Pager: [phone]  ________________________________________________________________________  Velora Heckler GI MD note:  I personally examined the patient, reviewed the data and agree with the assessment and plan described above.  She has post nasal drip sensation with viscous swallowing. She does NOT have any dysphagia to liquids or solids. Her weight has been stable.  I recommended EGD to make sure there is nothing serious causing the findings on Barium esophagram which I think is very unlikely. She prefers to go home and follow up with her gastroenterologist Dr. Earlean Shawl instead.  I think that is safe, she will contact him for appt in next 1-2 weeks.   Please call or page with any further questions or concerns.    Owens Loffler, MD [facility] Pager [phone]

## 2017-04-26 NOTE — Progress Notes (Signed)
Progress Note  Patient Name: [name] Date of Encounter: 04/26/2017  Primary Cardiologist: New to Dr. Radford Pax this admission  Subjective   Remains vague about symptoms but states she's seeing some improvement in breathing. States she honestly just can't wait to go home.  Inpatient Medications    Scheduled Meds: . doxycycline  100 mg Oral Q12H  . fluticasone  1 spray Each Nare Daily  . furosemide  40 mg Intravenous BID  . guaiFENesin  600 mg Oral BID  . magnesium oxide  400 mg Oral Daily  . pantoprazole  20 mg Oral Daily  . prednisoLONE acetate  1 drop Right Eye BID PC  . senna-docusate  1 tablet Oral BID   Continuous Infusions:  PRN Meds: acetaminophen   Vital Signs    Vitals:   04/25/17 1354 04/25/17 2159 04/26/17 0628 04/26/17 0803  BP: 136/65 131/85 (!) 144/66   Pulse: (!) 102 72 62   Resp: 18 (!) 21 19   Temp: 98.4 F (36.9 C) 98.2 F (36.8 C) 98.3 F (36.8 C)   TempSrc: Oral Oral Oral   SpO2: 90% 99% 94%   Weight:    169 lb 1.5 oz (76.7 kg)  Height:        Intake/Output Summary (Last 24 hours) at 04/26/2017 1030 Last data filed at 04/26/2017 1020 Gross per 24 hour  Intake 310 ml  Output 300 ml  Net 10 ml   Filed Weights   04/24/17 0447 04/25/17 0458 04/26/17 0803  Weight: 167 lb 12.3 oz (76.1 kg) 175 lb 11.3 oz (79.7 kg) 169 lb 1.5 oz (76.7 kg)    Telemetry    NSR/sinus arrhythmia versus slower atrial tach - Personally Reviewed  Physical Exam   GEN: No acute distress.  HEENT: Normocephalic, atraumatic, sclera non-icteric. Neck: No JVD or bruits. Cardiac: RRR 3/6 SEM RUSB no rubs or gallops.  Radials/DP/PT 1+ and equal bilaterally.  Respiratory: diminished at bases bilaterally; no wheezing/rales/rhonchi. Breathing is unlabored. GI: Soft, nontender, non-distended, BS +x 4. MS: no deformity. Extremities: No clubbing or cyanosis. No edema. Distal pedal pulses are 2+ and equal bilaterally. Neuro:  AAOx3. Follows commands. Psych:   Responds to questions appropriately with a normal affect.  Labs    Chemistry Recent Labs  Lab 04/22/17 1554  04/24/17 0601 04/25/17 0553 04/26/17 0619  NA 136   < > 137 134* 133*  K 3.9   < > 3.9 3.9 4.0  CL 103   < > 101 97* 92*  CO2 28   < > 32 32 35*  GLUCOSE 92   < > 89 94 103*  BUN 15   < > 15 19 20   CREATININE 0.65   < > 0.66 0.74 0.62  CALCIUM 10.0   < > 9.0 9.0 9.5  PROT 6.1*  --   --   --   --   ALBUMIN 4.0  --   --   --   --   AST 14*  --   --   --   --   ALT 13*  --   --   --   --   ALKPHOS 47  --   --   --   --   BILITOT 1.1  --   --   --   --   GFRNONAA >60   < > >60 >60 >60  GFRAA >60   < > >60 >60 >60  ANIONGAP 5   < > 4* 5  6   < > = values in this interval not displayed.     Hematology Recent Labs  Lab 04/24/17 0601 04/25/17 0553 04/26/17 0619  WBC 11.0* 11.8* 14.9*  RBC 3.74* 3.85* 4.11  HGB 9.2* 9.3* 10.0*  HCT 30.0* 31.0* 32.8*  MCV 80.2 80.5 79.8  MCH 24.6* 24.2* 24.3*  MCHC 30.7 30.0 30.5  RDW 16.6* 16.6* 16.5*  PLT 97* 80* 109*    Cardiac Enzymes Recent Labs  Lab 04/22/17 1547  TROPONINI <0.03   No results for input(s): TROPIPOC in the last 168 hours.   BNP Recent Labs  Lab 04/22/17 1554  BNP 150.9*     DDimer No results for input(s): DDIMER in the last 168 hours.   Radiology    Dg Esophagus  Result Date: 04/25/2017 CLINICAL DATA:  84 year old female with dysphagia. Initial encounter. EXAM: ESOPHOGRAM/BARIUM SWALLOW TECHNIQUE: Single contrast examination was performed using  thin barium. FLUOROSCOPY TIME:  Fluoroscopy Time:  2 minutes and 30 seconds Radiation Exposure Index: 30 mGy. COMPARISON:  None. FINDINGS: Patient is status post modified barium swallow earlier in the day. Exam had to be tailored to patient who had difficulty swallowing in the semi erect position. Patient not stable on her feet. Secondary to nauseous exam was terminated prior to obtaining oblique or lateral views. Poor primary esophageal stripping wave.  Prominent tertiary wave contractions noted throughout the exam. Moderate smooth narrowing at the gastroesophageal junction. Patient had difficulty propelling ingested 13 mm barium tablet which had to be dissolved with warm water. IMPRESSION: Presbyesophagus. Poor primary esophageal stripping wave. Prominent tertiary wave contractions noted throughout the exam. Moderate smooth narrowing at the gastroesophageal junction. Patient had difficulty propelling ingested 13 mm barium tablet which had to be dissolved with warm water. Electronically Signed   By: Genia Del M.D.   On: 04/25/2017 14:49   Dg Swallowing Func-speech Pathology  Result Date: 04/25/2017 Objective Swallowing Evaluation: Type of Study: MBS-Modified Barium Swallow Study  Patient Details Name: [name] MRN: [id number] Date of Birth: 1928-09-07 Today's Date: 04/25/2017 Time: SLP Start Time (ACUTE ONLY): 1050 -SLP Stop Time (ACUTE ONLY): 1110 SLP Time Calculation (min) (ACUTE ONLY): 20 min Past Medical History: Past Medical History: Diagnosis Date . Arthritis  . CHF exacerbation ([place]) 04/22/2017 . CLL (chronic lymphocytic leukemia) ([place]) 11/10/2012 . Hypertension  . Knee pain, left  . Total knee replacement status  Past Surgical History: Past Surgical History: Procedure Laterality Date . APPENDECTOMY   . carpel tunnel surgery    bilateral . CHOLECYSTECTOMY   . COLON SURGERY   . HERNIA REPAIR    left . JOINT REPLACEMENT    Bilateral HPI: Bridget Culmer Dieringis a 84 y.o.femalewith medical history significant forCLL, HTN, glaucoma, presenting with cough and dyspnea.  Patient reporting that for 2 months has had daily sinus drainage that is very bothersome. She has also developed a cough that is productive in the morning. For the past two weeks has developed worsening shortness of breath and dyspnea on exertion. She thinks legs are a bit more swollen than normal, and also has worsening orthopnea. No fevers. No sick contacts. No vomiting or diarrhea. No  chest pain.  Chest xray is showing bibasilar densities suggestive of atelectasis and small bilateral pleural effusions.  CT angio of the chest is showing bibasilar collapse/consolidation with small to moderate bilateral pleural effusions.   Subjective: pt awake in chair Assessment / Plan / Recommendation CHL IP CLINICAL IMPRESSIONS 04/24/2017 Clinical Impression Pt with functional oropharyngeal swallow with  no aspiration/penetration nor significant oropharyngeal residuals.  Upon esophageal sweep, pt's distal esophagus appeared with retention - with barium tablet lodging near GE requiring more liquids to transit into stomach. Pt with referrant sensation of residuals to pharynx during MBS x2.  Above appearance of esophageal tapering, esophagus appeared dilated/wide - no radiologist present to confirm findings.  Suspect primary esophageal dysphagia based on findings.  Advised pt to mitigation strategies - she reports issues with "post-nasal drip" and denies significant deficits with swallowing.  Consideration for dedicated esophagram may be beneficial to elucidate.  Thanks for this consult.  SLP Visit Diagnosis Dysphagia, unspecified (R13.10) Attention and concentration deficit following -- Frontal lobe and executive function deficit following -- Impact on safety and function Mild aspiration risk   No flowsheet data found.  Prognosis 04/25/2017 Prognosis for Safe Diet Advancement Good Barriers to Reach Goals Other (Comment) Barriers/Prognosis Comment -- CHL IP DIET RECOMMENDATION 04/24/2017 SLP Diet Recommendations Regular solids;Thin liquid Liquid Administration via Cup;Straw Medication Administration Whole meds with liquid Compensations Slow rate;Small sips/bites Postural Changes Remain semi-upright after after feeds/meals (Comment);Seated upright at 90 degrees   CHL IP OTHER RECOMMENDATIONS 04/24/2017 Recommended Consults Consider esophageal assessment Oral Care Recommendations Oral care BID Other Recommendations --    CHL IP FOLLOW UP RECOMMENDATIONS 04/24/2017 Follow up Recommendations None   No flowsheet data found.     CHL IP ORAL PHASE 04/25/2017 Oral Phase WFL Oral - Pudding Teaspoon -- Oral - Pudding Cup -- Oral - Honey Teaspoon -- Oral - Honey Cup -- Oral - Nectar Teaspoon -- Oral - Nectar Cup WFL Oral - Nectar Straw -- Oral - Thin Teaspoon -- Oral - Thin Cup WFL Oral - Thin Straw WFL Oral - Puree WFL;Piecemeal swallowing Oral - Mech Soft -- Oral - Regular WFL Oral - Multi-Consistency -- Oral - Pill WFL Oral Phase - Comment --  CHL IP PHARYNGEAL PHASE 04/25/2017 Pharyngeal Phase WFL Pharyngeal- Pudding Teaspoon -- Pharyngeal -- Pharyngeal- Pudding Cup -- Pharyngeal -- Pharyngeal- Honey Teaspoon -- Pharyngeal -- Pharyngeal- Honey Cup -- Pharyngeal -- Pharyngeal- Nectar Teaspoon -- Pharyngeal -- Pharyngeal- Nectar Cup WFL Pharyngeal -- Pharyngeal- Nectar Straw -- Pharyngeal -- Pharyngeal- Thin Teaspoon -- Pharyngeal -- Pharyngeal- Thin Cup WFL Pharyngeal -- Pharyngeal- Thin Straw WFL Pharyngeal -- Pharyngeal- Puree WFL Pharyngeal -- Pharyngeal- Mechanical Soft -- Pharyngeal -- Pharyngeal- Regular WFL Pharyngeal -- Pharyngeal- Multi-consistency -- Pharyngeal -- Pharyngeal- Pill WFL Pharyngeal -- Pharyngeal Comment --  CHL IP CERVICAL ESOPHAGEAL PHASE 04/25/2017 Cervical Esophageal Phase WFL Pudding Teaspoon -- Pudding Cup -- Honey Teaspoon -- Honey Cup -- Nectar Teaspoon -- Nectar Cup -- Nectar Straw -- Thin Teaspoon -- Thin Cup -- Thin Straw -- Puree -- Mechanical Soft -- Regular -- Multi-consistency -- Pill -- Cervical Esophageal Comment -- No flowsheet data found. Luanna Salk, MS [facility] SLP [phone]               Cardiac Studies   2D echo 04/23/17 Study Conclusions  - Left ventricle: The cavity size was normal. Wall thickness was increased in a pattern of moderate LVH. Systolic function was normal. The estimated ejection fraction was in the range of 55% to 60%. Doppler parameters are consistent with abnormal  left ventricular relaxation (grade 1 diastolic dysfunction). - Aortic valve: Mildly to moderately calcified annulus. Moderately thickened leaflets. There was moderate stenosis. Mean gradient (S): 25 mm Hg. Valve area (VTI): 1.43 cm^2. Valve area (Vmax): 1.39 cm^2. Valve area (Vmean): 1.36 cm^2. - Mitral valve: Mildly to moderately calcified annulus. Mildly thickened  leaflets . - Left atrium: The atrium was moderately dilated. - Atrial septum: No defect or patent foramen ovale was identified. - Pericardium, extracardiac: Moderate left pleural effusion. Small pericardial effusion adjacent to the LV.    Patient Profile     84 y.o. female with CLL (on ibrutinib from 07/2016), HTN, glaucoma, recent outpatient hypoxia (O2 sat 86% 01/2017) who presented to [facility] with a 2 week history of worsening SOB and persistent hypoxia, small to moderate bilateral pleural effusions. Also notes 2 month hx of nasal congestion. IM/oncology have stopped Imbruvica due to potential contribution of symptoms.  Assessment & Plan    1. Acute (?on chronic) hypoxic respiratory failure with pleural effusions with bibasilar collapse/consolidation - the degree of hypoxia and pleural effusions seem out of proportion for her BNP to be only 150, suggesting the effusions may have a concomitant etiology. She has diuresed well clinically per her report. Weights are inconsistent but peak 179, down to 169 today which is lower than recent OP weights. I/O's have not been recorded although ordered (I see IM has placed care order clarifying need). Her labs are starting to show hyponatremia, hypochloremia and elevated bicarb. I will hold further Lasix pending discussion with Dr. Acie Fredrickson. Agree with Dr Phoebe Sharps previous thoughts that thoracentesis might be helpful for diagnostic purposes. Patient is also on doxycycline this admission.  2. Acute diastolic CHF - see above. LE duplex neg for DVT.   3. Coronary calcifications on CT: Dr.  Radford Pax also recommended to consider nuclear stress test once CHF is resolved to rule out ischemia given RBBB and coronary calcifications on CT. This can be deferred as outpatient once acute issues have resolved. Not currently on aspirin, could be considered when OK with IM.  4. Small pericardial effusion - no evidence of hemodynamic compromise; would follow for now.  5. CLL - oncology has expressed the OK to hold Imbruvica this admission.  For questions or updates, please contact [place] Please consult [email or web address] for contact info under Cardiology/STEMI.  Signed, Charlie Pitter, PA-C 04/26/2017, 10:30 AM    Attending Note:   The patient was seen and examined.  Agree with assessment and plan as noted above.  Changes made to the above note as needed.  Patient seen and independently examined with Melina Copa, PA .   We discussed all aspects of the encounter. I agree with the assessment and plan as stated above.  1.  Pleural effusions: Her effusions are not resolving with diuresis.  There is a plan to do thoracentesis tomorrow.  Chronic diastolic congestive heart failure: The patient has normal left ventricular systolic function with an EF of 55-60%.  She has grade 1 diastolic dysfunction.  It appears that she is over diuresed so we certainly need to cut back on her diuresis therapy.  She is 84 years old and is quite frail.  I do not think that she needs stress testing.  I think that she just needs standard therapy for diastolic dysfunction. She will follow-up with Dr. Radford Pax who will make further decisions about that.  We will sign off.  Please call for questions.   I have spent a total of 40 minutes with patient reviewing hospital  notes , telemetry, EKGs, labs and examining patient as well as establishing an assessment and plan that was discussed with the patient. > 50% of time was spent in direct patient care.    Thayer Headings, Brooke Bonito., MD, [facility] 04/26/2017, 2:46 PM [address],  [place] Pager [phone]

## 2017-04-26 NOTE — Care Management Note (Signed)
Case Management Note  Patient Details  Name: [name] MRN: [id number] Date of Birth: Sep 07, 1928  Subjective/Objective:                  Hypoxia and o2 supplementation  Action/Plan: Date: April 26, 2017 Velva Harman, BSN, [place], [place] Chart and notes review for patient progress and needs. Will follow for case management and discharge needs. No cm or discharge needs present at time of this review. Next review date: [id number]  Expected Discharge Date:                  Expected Discharge Plan:  Home/Self Care  In-House Referral:     Discharge planning Services  CM Consult  Post Acute Care Choice:    Choice offered to:     DME Arranged:    DME Agency:     HH Arranged:    HH Agency:     Status of Service:  In process, will continue to follow  If discussed at Long Length of Stay Meetings, dates discussed:    Additional Comments:  Leeroy Cha, RN 04/26/2017, 10:25 AM

## 2017-04-27 DIAGNOSIS — R1312 Dysphagia, oropharyngeal phase: Secondary | ICD-10-CM

## 2017-04-27 DIAGNOSIS — C9190 Lymphoid leukemia, unspecified not having achieved remission: Secondary | ICD-10-CM

## 2017-04-27 DIAGNOSIS — J9601 Acute respiratory failure with hypoxia: Secondary | ICD-10-CM

## 2017-04-27 LAB — CBC WITH DIFFERENTIAL/PLATELET
BASOS PCT: 0 %
Basophils Absolute: 0 10*3/uL (ref 0.0–0.1)
EOS ABS: 0.2 10*3/uL (ref 0.0–0.7)
Eosinophils Relative: 2 %
HCT: 30.9 % — ABNORMAL LOW (ref 36.0–46.0)
Hemoglobin: 9.3 g/dL — ABNORMAL LOW (ref 12.0–15.0)
Lymphocytes Relative: 73 %
Lymphs Abs: 7.5 10*3/uL — ABNORMAL HIGH (ref 0.7–4.0)
MCH: 24.1 pg — AB (ref 26.0–34.0)
MCHC: 30.1 g/dL (ref 30.0–36.0)
MCV: 80.1 fL (ref 78.0–100.0)
Monocytes Absolute: 0.5 10*3/uL (ref 0.1–1.0)
Monocytes Relative: 5 %
NEUTROS ABS: 2.1 10*3/uL (ref 1.7–7.7)
Neutrophils Relative %: 20 %
PLATELETS: 83 10*3/uL — AB (ref 150–400)
RBC: 3.86 MIL/uL — ABNORMAL LOW (ref 3.87–5.11)
RDW: 16.5 % — ABNORMAL HIGH (ref 11.5–15.5)
WBC: 10.3 10*3/uL (ref 4.0–10.5)

## 2017-04-27 LAB — HAPTOGLOBIN: Haptoglobin: 32 mg/dL — ABNORMAL LOW (ref 34–200)

## 2017-04-27 LAB — BASIC METABOLIC PANEL
Anion gap: 5 (ref 5–15)
BUN: 18 mg/dL (ref 6–20)
CHLORIDE: 95 mmol/L — AB (ref 101–111)
CO2: 35 mmol/L — ABNORMAL HIGH (ref 22–32)
CREATININE: 0.54 mg/dL (ref 0.44–1.00)
Calcium: 9.5 mg/dL (ref 8.9–10.3)
GFR calc Af Amer: >60 mL/min (ref >60)
Glucose, Bld: 89 mg/dL (ref 65–99)
Potassium: 3.9 mmol/L (ref 3.5–5.1)
SODIUM: 135 mmol/L (ref 135–145)

## 2017-04-27 LAB — MAGNESIUM: MAGNESIUM: 1.8 mg/dL (ref 1.7–2.4)

## 2017-04-27 MED ORDER — OXYMETAZOLINE HCL 0.05 % NA SOLN: 2.0000 | Freq: Two times a day (BID) | NASAL | 0 refills | Status: DC

## 2017-04-27 MED ORDER — LORATADINE 10 MG PO TABS: 10.0000 mg | ORAL_TABLET | Freq: Every day | ORAL | 0 refills | Status: DC

## 2017-04-27 MED ORDER — SALINE SPRAY 0.65 % NA SOLN: 1.0000 | NASAL | 0 refills | Status: DC | PRN

## 2017-04-27 MED ORDER — POTASSIUM CHLORIDE ER 10 MEQ PO TBCR: 20.0000 meq | EXTENDED_RELEASE_TABLET | Freq: Every day | ORAL | 0 refills | Status: DC

## 2017-04-27 MED ORDER — FUROSEMIDE 40 MG PO TABS: 40.0000 mg | ORAL_TABLET | Freq: Every day | ORAL | 0 refills | Status: DC

## 2017-04-27 MED ORDER — OXYMETAZOLINE HCL 0.05 % NA SOLN
2.0000 | Freq: Two times a day (BID) | NASAL | Status: DC
  Administered 2017-04-27: 2 via NASAL
  Filled 2017-04-27: qty 15

## 2017-04-27 MED ORDER — FLUTICASONE PROPIONATE 50 MCG/ACT NA SUSP: 1.0000 | Freq: Every day | NASAL | 0 refills | Status: AC

## 2017-04-27 MED ORDER — LORATADINE 10 MG PO TABS
10.0000 mg | ORAL_TABLET | Freq: Every day | ORAL | Status: DC
  Administered 2017-04-27: 10 mg via ORAL
  Filled 2017-04-27: qty 1

## 2017-04-27 NOTE — Progress Notes (Signed)
Discharge instructions and medications discussed with patient and son.  Prescriptions and AVS given to son.  All questions answered. Oxygen tanks delivered to patient's room by [facility].

## 2017-04-27 NOTE — Discharge Summary (Signed)
PATIENT DETAILS Name: [name] Age: 84 y.o. Sex: female Date of Birth: 1928/04/11 MRN: [id number]. Admitting Physician: Rise Patience, MD [id number], Gwyndolyn Saxon, MD  Admit Date: 04/22/2017 Discharge date: 04/27/2017  Recommendations for Outpatient Follow-up:  1. Follow up with PCP in 1-2 weeks 2. Please obtain BMP/CBC in one week 3. Consider outpatient thoracocentesis if she continues to have dyspnea on exertion (refused while inpatient) 4. Ensure outpatient follow-up with oncology and cardiology   Admitted From:  Home  Disposition: Home with home health services/Hospice    Home Health:  Yes  Equipment/Devices: oxygen 2L,   Discharge Condition: Stable  CODE STATUS: FULL CODE  Diet recommendation:  Heart Healthy   Brief Summary: See H&P, Labs, Consult and Test reports for all details in brief, patient is a 84 year old female who has CLL-admitted for worsening shortness of breath, nasal congestion with postnasal drip-start to have acute on chronic diastolic heart failure and admitted to the hospitalist service.  See below for further details  Brief Hospital Course: Acute hypoxemic respiratory failure thought to be secondary to acute on chronic diastolic heart failure, and bilateral pleural effusions: She was admitted and started on diuresis.  CTA of the chest did not show any pulmonary embolism, 2D echocardiogram showed preserved EF.  It was not clear whether this was secondary to cardiotoxicity from a ibruitinib for and this was placed on hold after discussion with oncology.  Cardiology was not very convinced that her symptomatology was consistent with heart failure.  Plans were to pursue thoracocentesis-however patient has refused.  Her main issue seems to be postnasal drip syndrome-that she claims provokes her shortness of breath and cough.  On discharge, we will continue to hold ibrutinib, continue gentle diuretics-and treat her postnasal drip syndrome with  Claritin, Afrin and Flonase.  She has a follow-up scheduled with her primary oncologist this coming Friday-and has been encouraged to keep that appointment.  If she does continue to have shortness of breath-and if she is agreeable to pursue a thoracocentesis-this can to be done in the outpatient setting at the discretion of her outpatient MDs.  Per nursing staff, she has qualified for home O2-we will discharge her home on oxygen as well.  ?Dysphagia: Was evaluated by gastroenterology-endoscopy was offered-but patient refused, she would rather follow-up with her primary gastroenterologist-Dr. Earlean Shawl.  Per GI note-did doubt dysphagia and attribute her symptoms to postnasal drip sensation.  Chronic rhinitis/sinusitis: She claims to have clear nasal discharge and congestion-and gives clear-cut history of postnasal drip-that has been going on intermittently for the past few months.  It seems that this appears to be her major problem when I evaluated her today.  We will go ahead and place her on Claritin, Afrin nasal spray and Flonase.  If she continues to have symptoms-would consider addition of Singulair.  She may benefit from outpatient ENT evaluation if her symptoms still persist.  Moderate aortic stenosis: Seen on echocardiogram-stable for outpatient follow-up.  Procedures/Studies: 1/26 >>Echo - Left ventricle: The cavity size was normal. Wall thickness was   increased in a pattern of moderate LVH. Systolic function was   normal. The estimated ejection fraction was in the range of 55%   to 60%. Doppler parameters are consistent with abnormal left   ventricular relaxation (grade 1 diastolic dysfunction). - Aortic valve: Mildly to moderately calcified annulus. Moderately   thickened leaflets. There was moderate stenosis. Mean gradient   (S): 25 mm Hg. Valve area (VTI): 1.43 cm^2. Valve area (Vmax):   1.39  cm^2. Valve area (Vmean): 1.36 cm^2. - Mitral valve: Mildly to moderately calcified annulus.  Mildly   thickened leaflets . - Left atrium: The atrium was moderately dilated. - Atrial septum: No defect or patent foramen ovale was identified. - Pericardium, extracardiac: Moderate left pleural effusion. Small   pericardial effusion adjacent to the LV.  Discharge Diagnoses:  Active Problems:   CLL (chronic lymphocytic leukemia) (HCC)   Acute respiratory failure with hypoxia (HCC)   Chronic hypertension   Glaucoma   Acute diastolic CHF (congestive heart failure) (HCC)   Anemia   Thrombocytopenia (HCC)   Acute respiratory failure (HCC)   RBBB   Nonrheumatic aortic valve stenosis   Discharge Instructions:  Activity:  As tolerated with Full fall precautions use walker/cane & assistance as needed   Discharge Instructions    (HEART FAILURE PATIENTS) Call MD:  Anytime you have any of the following symptoms: 1) 3 pound weight gain in 24 hours or 5 pounds in 1 week 2) shortness of breath, with or without a dry hacking cough 3) swelling in the hands, feet or stomach 4) if you have to sleep on extra pillows at night in order to breathe.   Complete by:  As directed    Call MD for:  difficulty breathing, headache or visual disturbances   Complete by:  As directed    Call MD for:  temperature >100.4   Complete by:  As directed    Diet - low sodium heart healthy   Complete by:  As directed    Discharge instructions   Complete by:  As directed    Follow with Primary MD  Shirline Frees, MD  and Dr. Marin Olp as previously scheduled  Please get a complete blood count and chemistry panel checked by your Primary MD at your next visit, and again as instructed by your Primary MD.  Get Medicines reviewed and adjusted: Please take all your medications with you for your next visit with your Primary MD  Laboratory/radiological data: Please request your Primary MD to go over all hospital tests and procedure/radiological results at the follow up, please ask your Primary MD to get all Hospital  records sent to his/her office.  In some cases, they will be blood work, cultures and biopsy results pending at the time of your discharge. Please request that your primary care M.D. follows up on these results.  Also Note the following: If you experience worsening of your admission symptoms, develop shortness of breath, life threatening emergency, suicidal or homicidal thoughts you must seek medical attention immediately by calling 911 or calling your MD immediately  if symptoms less severe.  You must read complete instructions/literature along with all the possible adverse reactions/side effects for all the Medicines you take and that have been prescribed to you. Take any new Medicines after you have completely understood and accpet all the possible adverse reactions/side effects.   Do not drive when taking Pain medications or sleeping medications (Benzodaizepines)  Do not take more than prescribed Pain, Sleep and Anxiety Medications. It is not advisable to combine anxiety,sleep and pain medications without talking with your primary care practitioner  Special Instructions: If you have smoked or chewed Tobacco  in the last 2 yrs please stop smoking, stop any regular Alcohol  and or any Recreational drug use.  Wear Seat belts while driving.  Please note: You were cared for by a hospitalist during your hospital stay. Once you are discharged, your primary care physician will handle any further medical  issues. Please note that NO REFILLS for any discharge medications will be authorized once you are discharged, as it is imperative that you return to your primary care physician (or establish a relationship with a primary care physician if you do not have one) for your post hospital discharge needs so that they can reassess your need for medications and monitor your lab values.   You have Congestive Heart Failure: Please call your Cardiologist or Primary MD-Anytime you have any of the following  symptoms:   1) 3 pound weight gain in 24 hours or 5 pounds in 1 week  2) shortness of breath, with or without a dry hacking cough 3) swelling in the hands, feet or stomach 4) if you have to sleep on extra pillows at night in order to breathe  Follow cardiac low salt diet and 1.5 lit/day fluid restriction.   Increase activity slowly   Complete by:  As directed      Allergies as of 04/27/2017   No Known Allergies     Medication List    STOP taking these medications   IMBRUVICA 420 MG Tabs Generic drug:  Ibrutinib   losartan 50 MG tablet Commonly known as:  COZAAR     TAKE these medications   amLODipine 5 MG tablet Commonly known as:  NORVASC Take 1 tablet (5 mg total) by mouth daily.   fluticasone 50 MCG/ACT nasal spray Commonly known as:  FLONASE Place 1 spray into both nostrils daily. Start taking on:  04/28/2017   furosemide 40 MG tablet Commonly known as:  LASIX Take 1 tablet (40 mg total) by mouth daily.   loratadine 10 MG tablet Commonly known as:  CLARITIN Take 1 tablet (10 mg total) by mouth daily. Start taking on:  04/28/2017   oxymetazoline 0.05 % nasal spray Commonly known as:  AFRIN Place 2 sprays into both nostrils 2 (two) times daily. For 5 more doses and then stop   potassium chloride 10 MEQ tablet Commonly known as:  K-DUR Take 2 tablets (20 mEq total) by mouth daily.   prednisoLONE acetate 1 % ophthalmic suspension Commonly known as:  PRED FORTE Place 1 drop into the right eye 2 (two) times daily.   sodium chloride 0.65 % Soln nasal spray Commonly known as:  OCEAN Place 1 spray into both nostrils as needed for congestion.   VITAMIN C PO Take 1 tablet by mouth daily.            Durable Medical Equipment  (From admission, onward)        Start     Ordered   04/25/17 1452  DME Oxygen  Once    Question Answer Comment  Mode or (Route) Nasal cannula   Liters per Minute 2   Frequency Continuous (stationary and portable oxygen unit  needed)   Oxygen conserving device Yes   Oxygen delivery system Gas      04/25/17 1451     Follow-up Information    Sueanne Margarita, MD Follow up in 1 month(s).   Specialty:  Cardiology Why:  heart failure Contact information: [address] [place] [address] [phone]        Richmond Campbell, MD Follow up in 2 week(s).   Specialty:  Gastroenterology Why:  for esophageal narrowing. consider EGD Contact information: [place] [address] [phone]        Shirline Frees, MD Follow up in 2 week(s).   Specialty:  Family Medicine Why:  hospital discharge follow up, . Contact information: [place] [address] [phone]        [name], MD Follow up.   Specialty:  Oncology Contact information: [address] [place] [address] [phone]          No Known Allergies  Consultations:   cardiology and hematology/oncology   Gastroenterology   Other Procedures/Studies: Dg Chest 2 View  Result Date: 04/22/2017 CLINICAL DATA:  84 year old with decreased oxygen saturations. Shortness of breath. EXAM: CHEST  2 VIEW COMPARISON:  04/22/2017 chest radiograph and CT. FINDINGS: Stable cardiac enlargement. Persistent bibasilar densities that are compatible with volume loss and small effusions. Prominent central vascular structures. Atherosclerotic calcifications at the aortic arch. IMPRESSION: Persistent bibasilar chest densities. Findings are suggestive for atelectasis and small effusions. Minimal change from the recent comparison examination. Vascular congestion and cannot exclude mild pulmonary edema. Electronically Signed   By: Markus Daft M.D.   On: 04/22/2017 17:24   Ct Angio Chest Pe W/cm &/or Wo Cm  Result Date: 04/22/2017 CLINICAL DATA:  Decreasing O2 sats. Shortness of breath and fatigue. History of CLL. EXAM: CT ANGIOGRAPHY CHEST WITH CONTRAST TECHNIQUE: Multidetector  CT imaging of the chest was performed using the standard protocol during bolus administration of intravenous contrast. Multiplanar CT image reconstructions and MIPs were obtained to evaluate the vascular anatomy. CONTRAST:  180mL ISOVUE-370 IOPAMIDOL (ISOVUE-370) INJECTION 76% COMPARISON:  Standard CT chest 01/02/2009 FINDINGS: Cardiovascular: The heart is enlarged. Small pericardial effusion evident. Coronary artery calcification is evident. Atherosclerotic calcification is noted in the wall of the thoracic aorta. No filling defect in the opacified pulmonary arteries to suggest the presence of an acute pulmonary embolus. Pulmonary arterial enlargement suggests pulmonary arterial hypertension. Mediastinum/Nodes: No mediastinal lymphadenopathy. There is no hilar lymphadenopathy. The esophagus has normal imaging features. There is no axillary lymphadenopathy. Lungs/Pleura: Interlobular septal thickening is associated with bibasilar collapse/consolidation and small to moderate bilateral pleural effusions. Upper Abdomen: Unremarkable. Musculoskeletal: Bone windows reveal no worrisome lytic or sclerotic osseous lesions. Review of the MIP images confirms the above findings. IMPRESSION: 1. No CT evidence for acute pulmonary embolus. 2. Pulmonary arterial enlargement suggests pulmonary arterial hypertension. 3. Cardiomegaly with small pericardial effusion. 4. Bibasilar collapse/consolidation with small to moderate bilateral pleural effusions. 5. Coronary artery and Aortic Atherosclerois (ICD10-170.0) Electronically Signed   By: Misty Stanley M.D.   On: 04/22/2017 17:26   Dg Esophagus  Result Date: 04/25/2017 CLINICAL DATA:  84 year old female with dysphagia. Initial encounter. EXAM: ESOPHOGRAM/BARIUM SWALLOW TECHNIQUE: Single contrast examination was performed using  thin barium. FLUOROSCOPY TIME:  Fluoroscopy Time:  2 minutes and 30 seconds Radiation Exposure Index: 30 mGy. COMPARISON:  None. FINDINGS: Patient is  status post modified barium swallow earlier in the day. Exam had to be tailored to patient who had difficulty swallowing in the semi erect position. Patient not stable on her feet. Secondary to nauseous exam was terminated prior to obtaining oblique or lateral views. Poor primary esophageal stripping wave. Prominent tertiary wave contractions noted throughout the exam. Moderate smooth narrowing at the gastroesophageal junction. Patient had difficulty propelling ingested 13 mm barium tablet which had to be dissolved with warm water. IMPRESSION: Presbyesophagus. Poor primary esophageal stripping wave. Prominent tertiary wave contractions noted throughout the exam. Moderate smooth narrowing at the gastroesophageal junction. Patient had difficulty propelling ingested 13 mm barium tablet which had to be dissolved with warm water. Electronically Signed   By: Alcide Evener.D.  On: 04/25/2017 14:49   US Venous Img Lower Unilateral Right  Result Date: 04/22/2017 CLINICAL DATA:  84 year old female with right lower extremity pain and swelling. EXAM: RIGHT LOWER EXTREMITY VENOUS DOPPLER ULTRASOUND TECHNIQUE: Gray-scale sonography with graded compression, as well as color Doppler and duplex ultrasound were performed to evaluate the lower extremity deep venous systems from the level of the common femoral vein and including the common femoral, femoral, profunda femoral, popliteal and calf veins including the posterior tibial, peroneal and gastrocnemius veins when visible. The superficial great saphenous vein was also interrogated. Spectral Doppler was utilized to evaluate flow at rest and with distal augmentation maneuvers in the common femoral, femoral and popliteal veins. COMPARISON:  None. FINDINGS: Normal flow, compressibility, and augmentation within the distal common femoral, proximal profunda femoral, proximal greater saphenous, entire femoral, popliteal veins, and imaged calf veins. The right calf veins are  difficult to image. The left common femoral vein is patent without DVT. Subcutaneous edema is noted. A 3 x 1.4 x 1.6 cm popliteal/Baker's cyst is present. IMPRESSION: 1. No evidence of right lower extremity DVT. 2. Subcutaneous edema 3. 3 x 1.4 x 1.6 cm popliteal/Baker's cyst. Electronically Signed   By: Margarette Canada M.D.   On: 04/22/2017 18:21   Dg Swallowing Func-speech Pathology  Result Date: 04/25/2017 Objective Swallowing Evaluation: Type of Study: MBS-Modified Barium Swallow Study  Patient Details Name: SHAUNTE TUFT MRN: [id number] Date of Birth: [birth year] Today's Date: 04/25/2017 Time: SLP Start Time (ACUTE ONLY): 1050 -SLP Stop Time (ACUTE ONLY): 1110 SLP Time Calculation (min) (ACUTE ONLY): 20 min Past Medical History: Past Medical History: Diagnosis Date . Arthritis  . CHF exacerbation ([facility]) 04/22/2017 . CLL (chronic lymphocytic leukemia) ([facility]) 11/10/2012 . Hypertension  . Knee pain, left  . Total knee replacement status  Past Surgical History: Past Surgical History: Procedure Laterality Date . APPENDECTOMY   . carpel tunnel surgery    bilateral . CHOLECYSTECTOMY   . COLON SURGERY   . HERNIA REPAIR    left . JOINT REPLACEMENT    Bilateral HPI: Aryn Safran Dieringis a 84 y.o.femalewith medical history significant forCLL, HTN, glaucoma, presenting with cough and dyspnea.  Patient reporting that for 2 months has had daily sinus drainage that is very bothersome. She has also developed a cough that is productive in the morning. For the past two weeks has developed worsening shortness of breath and dyspnea on exertion. She thinks legs are a bit more swollen than normal, and also has worsening orthopnea. No fevers. No sick contacts. No vomiting or diarrhea. No chest pain.  Chest xray is showing bibasilar densities suggestive of atelectasis and small bilateral pleural effusions.  CT angio of the chest is showing bibasilar collapse/consolidation with small to moderate bilateral pleural effusions.   Subjective:  pt awake in chair Assessment / Plan / Recommendation CHL IP CLINICAL IMPRESSIONS 04/24/2017 Clinical Impression Pt with functional oropharyngeal swallow with no aspiration/penetration nor significant oropharyngeal residuals.  Upon esophageal sweep, pt's distal esophagus appeared with retention - with barium tablet lodging near GE requiring more liquids to transit into stomach. Pt with referrant sensation of residuals to pharynx during MBS x2.  Above appearance of esophageal tapering, esophagus appeared dilated/wide - no radiologist present to confirm findings.  Suspect primary esophageal dysphagia based on findings.  Advised pt to mitigation strategies - she reports issues with "post-nasal drip" and denies significant deficits with swallowing.  Consideration for dedicated esophagram may be beneficial to elucidate.  Thanks for this consult.  SLP Visit  Diagnosis Dysphagia, unspecified (R13.10) Attention and concentration deficit following -- Frontal lobe and executive function deficit following -- Impact on safety and function Mild aspiration risk   No flowsheet data found.  Prognosis 04/25/2017 Prognosis for Safe Diet Advancement Good Barriers to Reach Goals Other (Comment) Barriers/Prognosis Comment -- CHL IP DIET RECOMMENDATION 04/24/2017 SLP Diet Recommendations Regular solids;Thin liquid Liquid Administration via Cup;Straw Medication Administration Whole meds with liquid Compensations Slow rate;Small sips/bites Postural Changes Remain semi-upright after after feeds/meals (Comment);Seated upright at 90 degrees   CHL IP OTHER RECOMMENDATIONS 04/24/2017 Recommended Consults Consider esophageal assessment Oral Care Recommendations Oral care BID Other Recommendations --   CHL IP FOLLOW UP RECOMMENDATIONS 04/24/2017 Follow up Recommendations None   No flowsheet data found.     CHL IP ORAL PHASE 04/25/2017 Oral Phase WFL Oral - Pudding Teaspoon -- Oral - Pudding Cup -- Oral - Honey Teaspoon -- Oral - Honey Cup -- Oral -  Nectar Teaspoon -- Oral - Nectar Cup WFL Oral - Nectar Straw -- Oral - Thin Teaspoon -- Oral - Thin Cup WFL Oral - Thin Straw WFL Oral - Puree WFL;Piecemeal swallowing Oral - Mech Soft -- Oral - Regular WFL Oral - Multi-Consistency -- Oral - Pill WFL Oral Phase - Comment --  CHL IP PHARYNGEAL PHASE 04/25/2017 Pharyngeal Phase WFL Pharyngeal- Pudding Teaspoon -- Pharyngeal -- Pharyngeal- Pudding Cup -- Pharyngeal -- Pharyngeal- Honey Teaspoon -- Pharyngeal -- Pharyngeal- Honey Cup -- Pharyngeal -- Pharyngeal- Nectar Teaspoon -- Pharyngeal -- Pharyngeal- Nectar Cup WFL Pharyngeal -- Pharyngeal- Nectar Straw -- Pharyngeal -- Pharyngeal- Thin Teaspoon -- Pharyngeal -- Pharyngeal- Thin Cup WFL Pharyngeal -- Pharyngeal- Thin Straw WFL Pharyngeal -- Pharyngeal- Puree WFL Pharyngeal -- Pharyngeal- Mechanical Soft -- Pharyngeal -- Pharyngeal- Regular WFL Pharyngeal -- Pharyngeal- Multi-consistency -- Pharyngeal -- Pharyngeal- Pill WFL Pharyngeal -- Pharyngeal Comment --  CHL IP CERVICAL ESOPHAGEAL PHASE 04/25/2017 Cervical Esophageal Phase WFL Pudding Teaspoon -- Pudding Cup -- Honey Teaspoon -- Honey Cup -- Nectar Teaspoon -- Nectar Cup -- Nectar Straw -- Thin Teaspoon -- Thin Cup -- Thin Straw -- Puree -- Mechanical Soft -- Regular -- Multi-consistency -- Pill -- Cervical Esophageal Comment -- No flowsheet data found. Luanna Salk, MS [facility] SLP [phone]                TODAY-DAY OF DISCHARGE:  Subjective:   [name] today has no headache,no chest abdominal pain,no new weakness tingling or numbness, feels much better wants to go home today.   Objective:   Blood pressure 131/72, pulse 64, temperature 97.7 F (36.5 C), temperature source Oral, resp. rate 16, height 5\' 3"  (1.6 m), weight 77.1 kg (169 lb 15.6 oz), SpO2 96 %.  Intake/Output Summary (Last 24 hours) at 04/27/2017 1146 Last data filed at 04/27/2017 0954 Gross per 24 hour  Intake 480 ml  Output 300 ml  Net 180 ml   Filed Weights   04/26/17  0803 04/27/17 0500 04/27/17 0622  Weight: 76.7 kg (169 lb 1.5 oz) 77.3 kg (170 lb 6.7 oz) 77.1 kg (169 lb 15.6 oz)    Exam: Awake Alert, Oriented *3, No new F.N deficits, Normal affect [place].AT,PERRAL Supple Neck,No JVD, No cervical lymphadenopathy appriciated.  Symmetrical Chest wall movement, Good air movement bilaterally, CTAB RRR,No Gallops,Rubs or new Murmurs, No Parasternal Heave +ve B.Sounds, Abd Soft, Non tender, No organomegaly appriciated, No rebound -guarding or rigidity. No Cyanosis, Clubbing or edema, No new Rash or bruise   PERTINENT RADIOLOGIC STUDIES: Dg Chest 2 View  Result Date:  04/22/2017 CLINICAL DATA:  84 year old with decreased oxygen saturations. Shortness of breath. EXAM: CHEST  2 VIEW COMPARISON:  04/22/2017 chest radiograph and CT. FINDINGS: Stable cardiac enlargement. Persistent bibasilar densities that are compatible with volume loss and small effusions. Prominent central vascular structures. Atherosclerotic calcifications at the aortic arch. IMPRESSION: Persistent bibasilar chest densities. Findings are suggestive for atelectasis and small effusions. Minimal change from the recent comparison examination. Vascular congestion and cannot exclude mild pulmonary edema. Electronically Signed   By: Markus Daft M.D.   On: 04/22/2017 17:24   Ct Angio Chest Pe W/cm &/or Wo Cm  Result Date: 04/22/2017 CLINICAL DATA:  Decreasing O2 sats. Shortness of breath and fatigue. History of CLL. EXAM: CT ANGIOGRAPHY CHEST WITH CONTRAST TECHNIQUE: Multidetector CT imaging of the chest was performed using the standard protocol during bolus administration of intravenous contrast. Multiplanar CT image reconstructions and MIPs were obtained to evaluate the vascular anatomy. CONTRAST:  169mL ISOVUE-370 IOPAMIDOL (ISOVUE-370) INJECTION 76% COMPARISON:  Standard CT chest 01/02/2009 FINDINGS: Cardiovascular: The heart is enlarged. Small pericardial effusion evident. Coronary artery calcification is  evident. Atherosclerotic calcification is noted in the wall of the thoracic aorta. No filling defect in the opacified pulmonary arteries to suggest the presence of an acute pulmonary embolus. Pulmonary arterial enlargement suggests pulmonary arterial hypertension. Mediastinum/Nodes: No mediastinal lymphadenopathy. There is no hilar lymphadenopathy. The esophagus has normal imaging features. There is no axillary lymphadenopathy. Lungs/Pleura: Interlobular septal thickening is associated with bibasilar collapse/consolidation and small to moderate bilateral pleural effusions. Upper Abdomen: Unremarkable. Musculoskeletal: Bone windows reveal no worrisome lytic or sclerotic osseous lesions. Review of the MIP images confirms the above findings. IMPRESSION: 1. No CT evidence for acute pulmonary embolus. 2. Pulmonary arterial enlargement suggests pulmonary arterial hypertension. 3. Cardiomegaly with small pericardial effusion. 4. Bibasilar collapse/consolidation with small to moderate bilateral pleural effusions. 5. Coronary artery and Aortic Atherosclerois (ICD10-170.0) Electronically Signed   By: Misty Stanley M.D.   On: 04/22/2017 17:26   Dg Esophagus  Result Date: 04/25/2017 CLINICAL DATA:  84 year old female with dysphagia. Initial encounter. EXAM: ESOPHOGRAM/BARIUM SWALLOW TECHNIQUE: Single contrast examination was performed using  thin barium. FLUOROSCOPY TIME:  Fluoroscopy Time:  2 minutes and 30 seconds Radiation Exposure Index: 30 mGy. COMPARISON:  None. FINDINGS: Patient is status post modified barium swallow earlier in the day. Exam had to be tailored to patient who had difficulty swallowing in the semi erect position. Patient not stable on her feet. Secondary to nauseous exam was terminated prior to obtaining oblique or lateral views. Poor primary esophageal stripping wave. Prominent tertiary wave contractions noted throughout the exam. Moderate smooth narrowing at the gastroesophageal junction. Patient had  difficulty propelling ingested 13 mm barium tablet which had to be dissolved with warm water. IMPRESSION: Presbyesophagus. Poor primary esophageal stripping wave. Prominent tertiary wave contractions noted throughout the exam. Moderate smooth narrowing at the gastroesophageal junction. Patient had difficulty propelling ingested 13 mm barium tablet which had to be dissolved with warm water. Electronically Signed   By: Genia Del M.D.   On: 04/25/2017 14:49   US Venous Img Lower Unilateral Right  Result Date: 04/22/2017 CLINICAL DATA:  84 year old female with right lower extremity pain and swelling. EXAM: RIGHT LOWER EXTREMITY VENOUS DOPPLER ULTRASOUND TECHNIQUE: Gray-scale sonography with graded compression, as well as color Doppler and duplex ultrasound were performed to evaluate the lower extremity deep venous systems from the level of the common femoral vein and including the common femoral, femoral, profunda femoral, popliteal and calf veins including the  posterior tibial, peroneal and gastrocnemius veins when visible. The superficial great saphenous vein was also interrogated. Spectral Doppler was utilized to evaluate flow at rest and with distal augmentation maneuvers in the common femoral, femoral and popliteal veins. COMPARISON:  None. FINDINGS: Normal flow, compressibility, and augmentation within the distal common femoral, proximal profunda femoral, proximal greater saphenous, entire femoral, popliteal veins, and imaged calf veins. The right calf veins are difficult to image. The left common femoral vein is patent without DVT. Subcutaneous edema is noted. A 3 x 1.4 x 1.6 cm popliteal/Baker's cyst is present. IMPRESSION: 1. No evidence of right lower extremity DVT. 2. Subcutaneous edema 3. 3 x 1.4 x 1.6 cm popliteal/Baker's cyst. Electronically Signed   By: Margarette Canada M.D.   On: 04/22/2017 18:21   Dg Swallowing Func-speech Pathology  Result Date: 04/25/2017 Objective Swallowing Evaluation: Type  of Study: MBS-Modified Barium Swallow Study  Patient Details Name: KERLY RIGSBEE MRN: [id number] Date of Birth: November 06, 1928 Today's Date: 04/25/2017 Time: SLP Start Time (ACUTE ONLY): 1050 -SLP Stop Time (ACUTE ONLY): 1110 SLP Time Calculation (min) (ACUTE ONLY): 20 min Past Medical History: Past Medical History: Diagnosis Date . Arthritis  . CHF exacerbation ([place]) 04/22/2017 . CLL (chronic lymphocytic leukemia) ([place]) 11/10/2012 . Hypertension  . Knee pain, left  . Total knee replacement status  Past Surgical History: Past Surgical History: Procedure Laterality Date . APPENDECTOMY   . carpel tunnel surgery    bilateral . CHOLECYSTECTOMY   . COLON SURGERY   . HERNIA REPAIR    left . JOINT REPLACEMENT    Bilateral HPI: Agness Sibrian Dieringis a 84 y.o.femalewith medical history significant forCLL, HTN, glaucoma, presenting with cough and dyspnea.  Patient reporting that for 2 months has had daily sinus drainage that is very bothersome. She has also developed a cough that is productive in the morning. For the past two weeks has developed worsening shortness of breath and dyspnea on exertion. She thinks legs are a bit more swollen than normal, and also has worsening orthopnea. No fevers. No sick contacts. No vomiting or diarrhea. No chest pain.  Chest xray is showing bibasilar densities suggestive of atelectasis and small bilateral pleural effusions.  CT angio of the chest is showing bibasilar collapse/consolidation with small to moderate bilateral pleural effusions.   Subjective: pt awake in chair Assessment / Plan / Recommendation CHL IP CLINICAL IMPRESSIONS 04/24/2017 Clinical Impression Pt with functional oropharyngeal swallow with no aspiration/penetration nor significant oropharyngeal residuals.  Upon esophageal sweep, pt's distal esophagus appeared with retention - with barium tablet lodging near GE requiring more liquids to transit into stomach. Pt with referrant sensation of residuals to pharynx during MBS x2.  Above  appearance of esophageal tapering, esophagus appeared dilated/wide - no radiologist present to confirm findings.  Suspect primary esophageal dysphagia based on findings.  Advised pt to mitigation strategies - she reports issues with "post-nasal drip" and denies significant deficits with swallowing.  Consideration for dedicated esophagram may be beneficial to elucidate.  Thanks for this consult.  SLP Visit Diagnosis Dysphagia, unspecified (R13.10) Attention and concentration deficit following -- Frontal lobe and executive function deficit following -- Impact on safety and function Mild aspiration risk   No flowsheet data found.  Prognosis 04/25/2017 Prognosis for Safe Diet Advancement Good Barriers to Reach Goals Other (Comment) Barriers/Prognosis Comment -- CHL IP DIET RECOMMENDATION 04/24/2017 SLP Diet Recommendations Regular solids;Thin liquid Liquid Administration via Cup;Straw Medication Administration Whole meds with liquid Compensations Slow rate;Small sips/bites Postural Changes Remain semi-upright after  after feeds/meals (Comment);Seated upright at 90 degrees   CHL IP OTHER RECOMMENDATIONS 04/24/2017 Recommended Consults Consider esophageal assessment Oral Care Recommendations Oral care BID Other Recommendations --   CHL IP FOLLOW UP RECOMMENDATIONS 04/24/2017 Follow up Recommendations None   No flowsheet data found.     CHL IP ORAL PHASE 04/25/2017 Oral Phase WFL Oral - Pudding Teaspoon -- Oral - Pudding Cup -- Oral - Honey Teaspoon -- Oral - Honey Cup -- Oral - Nectar Teaspoon -- Oral - Nectar Cup WFL Oral - Nectar Straw -- Oral - Thin Teaspoon -- Oral - Thin Cup WFL Oral - Thin Straw WFL Oral - Puree WFL;Piecemeal swallowing Oral - Mech Soft -- Oral - Regular WFL Oral - Multi-Consistency -- Oral - Pill WFL Oral Phase - Comment --  CHL IP PHARYNGEAL PHASE 04/25/2017 Pharyngeal Phase WFL Pharyngeal- Pudding Teaspoon -- Pharyngeal -- Pharyngeal- Pudding Cup -- Pharyngeal -- Pharyngeal- Honey Teaspoon -- Pharyngeal  -- Pharyngeal- Honey Cup -- Pharyngeal -- Pharyngeal- Nectar Teaspoon -- Pharyngeal -- Pharyngeal- Nectar Cup WFL Pharyngeal -- Pharyngeal- Nectar Straw -- Pharyngeal -- Pharyngeal- Thin Teaspoon -- Pharyngeal -- Pharyngeal- Thin Cup WFL Pharyngeal -- Pharyngeal- Thin Straw WFL Pharyngeal -- Pharyngeal- Puree WFL Pharyngeal -- Pharyngeal- Mechanical Soft -- Pharyngeal -- Pharyngeal- Regular WFL Pharyngeal -- Pharyngeal- Multi-consistency -- Pharyngeal -- Pharyngeal- Pill WFL Pharyngeal -- Pharyngeal Comment --  CHL IP CERVICAL ESOPHAGEAL PHASE 04/25/2017 Cervical Esophageal Phase WFL Pudding Teaspoon -- Pudding Cup -- Honey Teaspoon -- Honey Cup -- Nectar Teaspoon -- Nectar Cup -- Nectar Straw -- Thin Teaspoon -- Thin Cup -- Thin Straw -- Puree -- Mechanical Soft -- Regular -- Multi-consistency -- Pill -- Cervical Esophageal Comment -- No flowsheet data found. Luanna Salk, MS [facility] SLP [phone]                PERTINENT LAB RESULTS: CBC: Recent Labs    04/26/17 0619 04/27/17 0615  WBC 14.9* 10.3  HGB 10.0* 9.3*  HCT 32.8* 30.9*  PLT 109* 83*   CMET CMP     Component Value Date/Time   NA 135 04/27/2017 0615   NA 143 03/10/2017 1154   NA 140 10/15/2016 1155   K 3.9 04/27/2017 0615   K 4.5 03/10/2017 1154   K 4.1 10/15/2016 1155   CL 95 (L) 04/27/2017 0615   CL 103 03/10/2017 1154   CO2 35 (H) 04/27/2017 0615   CO2 31 03/10/2017 1154   CO2 27 10/15/2016 1155   GLUCOSE 89 04/27/2017 0615   GLUCOSE 93 03/10/2017 1154   BUN 18 04/27/2017 0615   BUN 12 03/10/2017 1154   BUN 18.6 10/15/2016 1155   CREATININE 0.54 04/27/2017 0615   CREATININE 0.7 03/10/2017 1154   CREATININE 0.7 10/15/2016 1155   CALCIUM 9.5 04/27/2017 0615   CALCIUM 9.9 03/10/2017 1154   CALCIUM 10.2 10/15/2016 1155   PROT 6.1 (L) 04/22/2017 1554   PROT 5.8 (L) 03/10/2017 1154   PROT 5.7 (L) 10/15/2016 1155   ALBUMIN 4.0 04/22/2017 1554   ALBUMIN 3.8 03/10/2017 1154   ALBUMIN 3.8 10/15/2016 1155   AST 14 (L)  04/22/2017 1554   AST 15 03/10/2017 1154   AST 12 10/15/2016 1155   ALT 13 (L) 04/22/2017 1554   ALT 17 03/10/2017 1154   ALT 8 10/15/2016 1155   ALKPHOS 47 04/22/2017 1554   ALKPHOS 55 03/10/2017 1154   ALKPHOS 46 10/15/2016 1155   BILITOT 0.5 04/26/2017 0949   BILITOT 1.30 03/10/2017 1154   BILITOT  2.09 (H) 10/15/2016 1155   GFRNONAA >60 04/27/2017 0615   GFRAA >60 04/27/2017 0615    GFR Estimated Creatinine Clearance: 49.6 mL/min (by C-G formula based on SCr of 0.54 mg/dL). No results for input(s): LIPASE, AMYLASE in the last 72 hours. No results for input(s): CKTOTAL, CKMB, CKMBINDEX, TROPONINI in the last 72 hours. Invalid input(s): POCBNP No results for input(s): DDIMER in the last 72 hours. No results for input(s): HGBA1C in the last 72 hours. Recent Labs    04/26/17 0619  CHOL 140  HDL 66  LDLCALC 66  TRIG 42  CHOLHDL 2.1   No results for input(s): TSH, T4TOTAL, T3FREE, THYROIDAB in the last 72 hours.  Invalid input(s): FREET3 No results for input(s): VITAMINB12, FOLATE, FERRITIN, TIBC, IRON, RETICCTPCT in the last 72 hours. Coags: No results for input(s): INR in the last 72 hours.  Invalid input(s): PT Microbiology: No results found for this or any previous visit (from the past 240 hour(s)).  FURTHER DISCHARGE INSTRUCTIONS:  Get Medicines reviewed and adjusted: Please take all your medications with you for your next visit with your Primary MD  Laboratory/radiological data: Please request your Primary MD to go over all hospital tests and procedure/radiological results at the follow up, please ask your Primary MD to get all Hospital records sent to his/her office.  In some cases, they will be blood work, cultures and biopsy results pending at the time of your discharge. Please request that your primary care M.D. goes through all the records of your hospital data and follows up on these results.  Also Note the following: If you experience worsening of your  admission symptoms, develop shortness of breath, life threatening emergency, suicidal or homicidal thoughts you must seek medical attention immediately by calling 911 or calling your MD immediately  if symptoms less severe.  You must read complete instructions/literature along with all the possible adverse reactions/side effects for all the Medicines you take and that have been prescribed to you. Take any new Medicines after you have completely understood and accpet all the possible adverse reactions/side effects.   Do not drive when taking Pain medications or sleeping medications (Benzodaizepines)  Do not take more than prescribed Pain, Sleep and Anxiety Medications. It is not advisable to combine anxiety,sleep and pain medications without talking with your primary care practitioner  Special Instructions: If you have smoked or chewed Tobacco  in the last 2 yrs please stop smoking, stop any regular Alcohol  and or any Recreational drug use.  Wear Seat belts while driving.  Please note: You were cared for by a hospitalist during your hospital stay. Once you are discharged, your primary care physician will handle any further medical issues. Please note that NO REFILLS for any discharge medications will be authorized once you are discharged, as it is imperative that you return to your primary care physician (or establish a relationship with a primary care physician if you do not have one) for your post hospital discharge needs so that they can reassess your need for medications and monitor your lab values.  Total Time spent coordinating discharge including counseling, education and face to face time equals 45 minutes.  SignedOren Binet 04/27/2017 11:46 AM

## 2017-04-28 ENCOUNTER — Other Ambulatory Visit: Payer: Self-pay | Admitting: Hematology & Oncology

## 2017-04-28 DIAGNOSIS — C9110 Chronic lymphocytic leukemia of B-cell type not having achieved remission: Secondary | ICD-10-CM

## 2017-04-29 ENCOUNTER — Encounter: Payer: Self-pay | Admitting: Hematology & Oncology

## 2017-04-29 ENCOUNTER — Other Ambulatory Visit: Payer: Self-pay

## 2017-04-29 ENCOUNTER — Inpatient Hospital Stay: Payer: Medicare HMO | Attending: Hematology & Oncology

## 2017-04-29 ENCOUNTER — Inpatient Hospital Stay (HOSPITAL_BASED_OUTPATIENT_CLINIC_OR_DEPARTMENT_OTHER): Payer: Medicare HMO | Admitting: Hematology & Oncology

## 2017-04-29 VITALS — BP 156/57 | HR 66 | Temp 98.0°F | Wt 171.1 lb

## 2017-04-29 DIAGNOSIS — D500 Iron deficiency anemia secondary to blood loss (chronic): Secondary | ICD-10-CM

## 2017-04-29 DIAGNOSIS — Z79899 Other long term (current) drug therapy: Secondary | ICD-10-CM | POA: Insufficient documentation

## 2017-04-29 DIAGNOSIS — D649 Anemia, unspecified: Secondary | ICD-10-CM | POA: Insufficient documentation

## 2017-04-29 DIAGNOSIS — R011 Cardiac murmur, unspecified: Secondary | ICD-10-CM | POA: Diagnosis not present

## 2017-04-29 DIAGNOSIS — C9110 Chronic lymphocytic leukemia of B-cell type not having achieved remission: Secondary | ICD-10-CM | POA: Diagnosis not present

## 2017-04-29 DIAGNOSIS — I509 Heart failure, unspecified: Secondary | ICD-10-CM | POA: Insufficient documentation

## 2017-04-29 DIAGNOSIS — D801 Nonfamilial hypogammaglobulinemia: Secondary | ICD-10-CM | POA: Diagnosis not present

## 2017-04-29 HISTORY — DX: Nonfamilial hypogammaglobulinemia: D80.1

## 2017-04-29 LAB — CMP (CANCER CENTER ONLY)
ALT: 15 U/L (ref 0–55)
ANION GAP: 6 (ref 5–15)
AST: 19 U/L (ref 5–34)
Albumin: 3.5 g/dL (ref 3.5–5.0)
Alkaline Phosphatase: 52 U/L (ref 26–84)
BILIRUBIN TOTAL: 0.9 mg/dL (ref 0.2–1.2)
BUN: 21 mg/dL (ref 7–22)
CALCIUM: 10 mg/dL (ref 8.0–10.3)
CO2: 34 mmol/L — AB (ref 18–33)
CREATININE: 0.7 mg/dL (ref 0.60–1.10)
Chloride: 101 mmol/L (ref 98–108)
GLUCOSE: 147 mg/dL — AB (ref 73–118)
Potassium: 4.1 mmol/L (ref 3.3–4.7)
Sodium: 141 mmol/L (ref 128–145)
TOTAL PROTEIN: 5.7 g/dL — AB (ref 6.4–8.1)

## 2017-04-29 LAB — CBC WITH DIFFERENTIAL (CANCER CENTER ONLY)
BASOS ABS: 0 10*3/uL (ref 0.0–0.1)
Basophils Relative: 0 %
Eosinophils Absolute: 0.1 10*3/uL (ref 0.0–0.5)
Eosinophils Relative: 1 %
HCT: 33 % — ABNORMAL LOW (ref 34.8–46.6)
HEMOGLOBIN: 9.8 g/dL — AB (ref 11.6–15.9)
LYMPHS ABS: 4.2 10*3/uL — AB (ref 0.9–3.3)
LYMPHS PCT: 62 %
MCH: 24.3 pg — AB (ref 26.0–34.0)
MCHC: 29.7 g/dL — ABNORMAL LOW (ref 32.0–36.0)
MCV: 81.7 fL (ref 81.0–101.0)
Monocytes Absolute: 0.5 10*3/uL (ref 0.1–0.9)
Monocytes Relative: 7 %
NEUTROS PCT: 30 %
Neutro Abs: 2 10*3/uL (ref 1.5–6.5)
Platelet Count: 80 10*3/uL — ABNORMAL LOW (ref 145–400)
RBC: 4.04 MIL/uL (ref 3.70–5.32)
RDW: 16.1 % — ABNORMAL HIGH (ref 11.1–15.7)
WBC: 6.7 10*3/uL (ref 3.9–10.0)

## 2017-04-29 LAB — LACTATE DEHYDROGENASE: LDH: 154 U/L (ref 125–245)

## 2017-04-29 LAB — SAVE SMEAR

## 2017-04-29 NOTE — Progress Notes (Signed)
Hematology and Oncology Follow Up Visit  WILLADEEN COLANTUONO [id number] Jun 09, 1928 84 y.o. 04/29/2017   Principle Diagnosis:  Chronic lymphocytic leukemia-stage C Hypogammaglobulinemia-acquired   Current Therapy:    Imbruvica 420mg  po q day -on hold since 04/18/2017  IVIG-first dose given on 05/04/2017     Interim History:  Ms.  Connery is back for followup.  She actually was discharged from the hospital.  She was at [facility]  She was admitted because of shortness of breath.  It was not known what was the cause of this.  To no surprise, people thought that this was from the [facility].  As such, this was stopped.  She had an echocardiogram done while hospitalized.  She had a good ejection fraction of 55-60%.  She did not have any pulmonary function test done.  She did have a CT angiogram done.  This was unremarkable.  There is no adenopathy in the mediastinum or hilum.  She had no pleural fluid.  She had no upper abdominal issues.  She was discharged on supplemental oxygen.  She is not to happy about being on supplemental oxygen.  She has had no bleeding.  She has had no problems with bowels or bladder.  She has had no leg swelling.  She has had no rashes.  Currently, her performance status is ECOG 2-3.  Medications:  Current Outpatient Medications:  .  amLODipine (NORVASC) 5 MG tablet, Take 1 tablet (5 mg total) by mouth daily., Disp: 30 tablet, Rfl: 0 .  Ascorbic Acid (VITAMIN C PO), Take 1 tablet by mouth daily., Disp: , Rfl:  .  fluticasone (FLONASE) 50 MCG/ACT nasal spray, Place 1 spray into both nostrils daily., Disp: 16 g, Rfl: 0 .  furosemide (LASIX) 40 MG tablet, Take 1 tablet (40 mg total) by mouth daily., Disp: 30 tablet, Rfl: 0 .  IMBRUVICA 420 MG TABS, TAKE 1 TABLET BY MOUTH DAILY, Disp: 28 tablet, Rfl: 0 .  loratadine (CLARITIN) 10 MG tablet, Take 1 tablet (10 mg total) by mouth daily., Disp: 15 tablet, Rfl: 0 .  oxymetazoline (AFRIN) 0.05 % nasal spray,  Place 2 sprays into both nostrils 2 (two) times daily. For 5 more doses and then stop, Disp: 20 mL, Rfl: 0 .  potassium chloride (K-DUR) 10 MEQ tablet, Take 2 tablets (20 mEq total) by mouth daily., Disp: 30 tablet, Rfl: 0 .  prednisoLONE acetate (PRED FORTE) 1 % ophthalmic suspension, Place 1 drop into the right eye 2 (two) times daily. , Disp: , Rfl:  .  sodium chloride (OCEAN) 0.65 % SOLN nasal spray, Place 1 spray into both nostrils as needed for congestion., Disp: 30 mL, Rfl: 0  Allergies: No Known Allergies  Past Medical History.  Review of Systems: Review of Systems  Constitutional: Positive for malaise/fatigue.  HENT: Positive for congestion and sore throat.   Eyes: Negative.   Respiratory: Positive for cough and shortness of breath.   Cardiovascular: Negative.   Gastrointestinal: Negative.   Genitourinary: Negative.   Musculoskeletal: Positive for joint pain.  Skin: Negative.   Neurological: Positive for tingling and weakness.  Endo/Heme/Allergies: Negative.   Psychiatric/Behavioral: The patient is nervous/anxious.     Physical Exam:  weight is 171 lb 1.6 oz (77.6 kg). Her oral temperature is 98 F (36.7 C). Her blood pressure is 156/57 (abnormal) and her pulse is 66. Her oxygen saturation is 100%.   Physical Exam  Constitutional: She is oriented to person, place, and time.  HENT:  Head: Normocephalic  and atraumatic.  Mouth/Throat: Oropharynx is clear and moist.  Eyes: EOM are normal. Pupils are equal, round, and reactive to light.  Neck: Normal range of motion.  Cardiovascular: Normal rate, regular rhythm and normal heart sounds.  Pulmonary/Chest: Effort normal and breath sounds normal.  Abdominal: Soft. Bowel sounds are normal.  Musculoskeletal: Normal range of motion. She exhibits no edema, tenderness or deformity.  Lymphadenopathy:    She has no cervical adenopathy.  Neurological: She is alert and oriented to person, place, and time.  Skin: Skin is warm and  dry. No rash noted. No erythema.  Psychiatric: She has a normal mood and affect. Her behavior is normal. Judgment and thought content normal.  Vitals reviewed.  .  Lab Results  Component Value Date   WBC 6.7 04/29/2017   HGB 9.3 (L) 04/27/2017   HCT 33.0 (L) 04/29/2017   MCV 81.7 04/29/2017   PLT 80 (L) 04/29/2017     Chemistry      Component Value Date/Time   NA 141 04/29/2017 1219   NA 143 03/10/2017 1154   NA 140 10/15/2016 1155   K 4.1 04/29/2017 1219   K 4.5 03/10/2017 1154   K 4.1 10/15/2016 1155   CL 101 04/29/2017 1219   CL 103 03/10/2017 1154   CO2 34 (H) 04/29/2017 1219   CO2 31 03/10/2017 1154   CO2 27 10/15/2016 1155   BUN 21 04/29/2017 1219   BUN 12 03/10/2017 1154   BUN 18.6 10/15/2016 1155   CREATININE 0.54 04/27/2017 0615   CREATININE 0.7 03/10/2017 1154   CREATININE 0.7 10/15/2016 1155      Component Value Date/Time   CALCIUM 10.0 04/29/2017 1219   CALCIUM 9.9 03/10/2017 1154   CALCIUM 10.2 10/15/2016 1155   ALKPHOS 52 04/29/2017 1219   ALKPHOS 55 03/10/2017 1154   ALKPHOS 46 10/15/2016 1155   AST 19 04/29/2017 1219   AST 12 10/15/2016 1155   ALT 15 04/29/2017 1219   ALT 17 03/10/2017 1154   ALT 8 10/15/2016 1155   BILITOT 0.9 04/29/2017 1219   BILITOT 2.09 (H) 10/15/2016 1155       Impression and Plan: Ms. Lansberry is an 84 year old white female with CLL. She has stage C disease.   I just feel bad that she has had these issues.  I just do not believe that this is from [facility].  If it is, this would be a very unusual side effect.  Given that her blood count is doing okay, I think we can hold on the [facility] for right now.  She is somewhat anemic.  I am checking her iron studies.   She does have an element of hypogammaglobulinemia.  This might be part of the issue.  As such, I will see about giving her IVIG.  I think that she has a sinusitis.  She may have an element of bronchitis.  She does have an altered immune system.  I think that  IVIG could improve this.  And I spoke to she and her daughter for about 45 minutes.  Over 50% of the time was spent face-to-face with her.  I went through her entire hospitalization records.  I reviewed her lab work.  I explained why IVIG might help.  She and her daughter are in agreement.  Her CLL has responded incredibly well to [facility].  If we find that her CLL is worsening, we certainly have other options that we can consider.  I would like to see her back  in another 3-4 weeks.  She ristmas and a happy new year.Marland Kitchen  [name], MD 2/1/20193:59 PM

## 2017-05-02 DIAGNOSIS — C9110 Chronic lymphocytic leukemia of B-cell type not having achieved remission: Secondary | ICD-10-CM | POA: Diagnosis not present

## 2017-05-02 DIAGNOSIS — I5032 Chronic diastolic (congestive) heart failure: Secondary | ICD-10-CM | POA: Diagnosis not present

## 2017-05-02 DIAGNOSIS — H409 Unspecified glaucoma: Secondary | ICD-10-CM | POA: Diagnosis not present

## 2017-05-02 DIAGNOSIS — Z9981 Dependence on supplemental oxygen: Secondary | ICD-10-CM | POA: Diagnosis not present

## 2017-05-02 DIAGNOSIS — R1310 Dysphagia, unspecified: Secondary | ICD-10-CM | POA: Diagnosis not present

## 2017-05-02 DIAGNOSIS — I350 Nonrheumatic aortic (valve) stenosis: Secondary | ICD-10-CM | POA: Diagnosis not present

## 2017-05-02 DIAGNOSIS — I110 Hypertensive heart disease with heart failure: Secondary | ICD-10-CM | POA: Diagnosis not present

## 2017-05-02 DIAGNOSIS — I4510 Unspecified right bundle-branch block: Secondary | ICD-10-CM | POA: Diagnosis not present

## 2017-05-02 DIAGNOSIS — J9610 Chronic respiratory failure, unspecified whether with hypoxia or hypercapnia: Secondary | ICD-10-CM | POA: Diagnosis not present

## 2017-05-02 DIAGNOSIS — D649 Anemia, unspecified: Secondary | ICD-10-CM | POA: Diagnosis not present

## 2017-05-02 LAB — IRON AND TIBC
Iron: 62 ug/dL (ref 41–142)
Saturation Ratios: 19 % — ABNORMAL LOW (ref 21–57)
TIBC: 334 ug/dL (ref 236–444)
UIBC: 272 ug/dL

## 2017-05-02 LAB — FERRITIN: Ferritin: 16 ng/mL (ref 9–269)

## 2017-05-03 DIAGNOSIS — I4510 Unspecified right bundle-branch block: Secondary | ICD-10-CM | POA: Diagnosis not present

## 2017-05-03 DIAGNOSIS — D649 Anemia, unspecified: Secondary | ICD-10-CM | POA: Diagnosis not present

## 2017-05-03 DIAGNOSIS — C9110 Chronic lymphocytic leukemia of B-cell type not having achieved remission: Secondary | ICD-10-CM | POA: Diagnosis not present

## 2017-05-03 DIAGNOSIS — H409 Unspecified glaucoma: Secondary | ICD-10-CM | POA: Diagnosis not present

## 2017-05-03 DIAGNOSIS — I350 Nonrheumatic aortic (valve) stenosis: Secondary | ICD-10-CM | POA: Diagnosis not present

## 2017-05-03 DIAGNOSIS — R1310 Dysphagia, unspecified: Secondary | ICD-10-CM | POA: Diagnosis not present

## 2017-05-03 DIAGNOSIS — J9610 Chronic respiratory failure, unspecified whether with hypoxia or hypercapnia: Secondary | ICD-10-CM | POA: Diagnosis not present

## 2017-05-03 DIAGNOSIS — I110 Hypertensive heart disease with heart failure: Secondary | ICD-10-CM | POA: Diagnosis not present

## 2017-05-03 DIAGNOSIS — Z9981 Dependence on supplemental oxygen: Secondary | ICD-10-CM | POA: Diagnosis not present

## 2017-05-03 DIAGNOSIS — I5032 Chronic diastolic (congestive) heart failure: Secondary | ICD-10-CM | POA: Diagnosis not present

## 2017-05-06 DIAGNOSIS — J9610 Chronic respiratory failure, unspecified whether with hypoxia or hypercapnia: Secondary | ICD-10-CM | POA: Diagnosis not present

## 2017-05-06 DIAGNOSIS — R1310 Dysphagia, unspecified: Secondary | ICD-10-CM | POA: Diagnosis not present

## 2017-05-06 DIAGNOSIS — I5032 Chronic diastolic (congestive) heart failure: Secondary | ICD-10-CM | POA: Diagnosis not present

## 2017-05-06 DIAGNOSIS — R0902 Hypoxemia: Secondary | ICD-10-CM | POA: Diagnosis not present

## 2017-05-06 DIAGNOSIS — C9110 Chronic lymphocytic leukemia of B-cell type not having achieved remission: Secondary | ICD-10-CM | POA: Diagnosis not present

## 2017-05-06 DIAGNOSIS — E876 Hypokalemia: Secondary | ICD-10-CM | POA: Diagnosis not present

## 2017-05-06 DIAGNOSIS — R600 Localized edema: Secondary | ICD-10-CM | POA: Diagnosis not present

## 2017-05-06 DIAGNOSIS — I350 Nonrheumatic aortic (valve) stenosis: Secondary | ICD-10-CM | POA: Diagnosis not present

## 2017-05-06 DIAGNOSIS — I110 Hypertensive heart disease with heart failure: Secondary | ICD-10-CM | POA: Diagnosis not present

## 2017-05-06 DIAGNOSIS — I4510 Unspecified right bundle-branch block: Secondary | ICD-10-CM | POA: Diagnosis not present

## 2017-05-06 DIAGNOSIS — I10 Essential (primary) hypertension: Secondary | ICD-10-CM | POA: Diagnosis not present

## 2017-05-06 DIAGNOSIS — H409 Unspecified glaucoma: Secondary | ICD-10-CM | POA: Diagnosis not present

## 2017-05-06 DIAGNOSIS — Z9981 Dependence on supplemental oxygen: Secondary | ICD-10-CM | POA: Diagnosis not present

## 2017-05-06 DIAGNOSIS — D649 Anemia, unspecified: Secondary | ICD-10-CM | POA: Diagnosis not present

## 2017-05-09 ENCOUNTER — Inpatient Hospital Stay: Payer: Medicare HMO

## 2017-05-09 VITALS — BP 142/57 | HR 65 | Temp 97.8°F | Resp 18

## 2017-05-09 DIAGNOSIS — Z79899 Other long term (current) drug therapy: Secondary | ICD-10-CM | POA: Diagnosis not present

## 2017-05-09 DIAGNOSIS — I509 Heart failure, unspecified: Secondary | ICD-10-CM | POA: Diagnosis not present

## 2017-05-09 DIAGNOSIS — D649 Anemia, unspecified: Secondary | ICD-10-CM | POA: Diagnosis not present

## 2017-05-09 DIAGNOSIS — D801 Nonfamilial hypogammaglobulinemia: Secondary | ICD-10-CM

## 2017-05-09 DIAGNOSIS — C9110 Chronic lymphocytic leukemia of B-cell type not having achieved remission: Secondary | ICD-10-CM

## 2017-05-09 DIAGNOSIS — R011 Cardiac murmur, unspecified: Secondary | ICD-10-CM | POA: Diagnosis not present

## 2017-05-09 LAB — CMP (CANCER CENTER ONLY)
ALBUMIN: 3.5 g/dL (ref 3.5–5.0)
ALT: 13 U/L (ref 0–55)
AST: 17 U/L (ref 5–34)
Alkaline Phosphatase: 51 U/L (ref 26–84)
Anion gap: 10 (ref 5–15)
BUN: 22 mg/dL (ref 7–22)
CHLORIDE: 103 mmol/L (ref 98–108)
CO2: 29 mmol/L (ref 18–33)
CREATININE: 0.7 mg/dL (ref 0.60–1.10)
Calcium: 9.9 mg/dL (ref 8.0–10.3)
Glucose, Bld: 107 mg/dL (ref 73–118)
Potassium: 3.8 mmol/L (ref 3.3–4.7)
SODIUM: 142 mmol/L (ref 128–145)
Total Bilirubin: 1.2 mg/dL (ref 0.2–1.2)
Total Protein: 5.7 g/dL — ABNORMAL LOW (ref 6.4–8.1)

## 2017-05-09 LAB — LACTATE DEHYDROGENASE: LDH: 141 U/L (ref 125–245)

## 2017-05-09 LAB — CBC WITH DIFFERENTIAL (CANCER CENTER ONLY)
BASOS PCT: 0 %
Basophils Absolute: 0 10*3/uL (ref 0.0–0.1)
EOS ABS: 0.1 10*3/uL (ref 0.0–0.5)
EOS PCT: 2 %
HCT: 32.4 % — ABNORMAL LOW (ref 34.8–46.6)
Hemoglobin: 9.6 g/dL — ABNORMAL LOW (ref 11.6–15.9)
LYMPHS ABS: 2.1 10*3/uL (ref 0.9–3.3)
Lymphocytes Relative: 34 %
MCH: 24.5 pg — ABNORMAL LOW (ref 26.0–34.0)
MCHC: 29.6 g/dL — AB (ref 32.0–36.0)
MCV: 82.7 fL (ref 81.0–101.0)
MONO ABS: 0.5 10*3/uL (ref 0.1–0.9)
MONOS PCT: 7 %
NEUTROS PCT: 57 %
Neutro Abs: 3.4 10*3/uL (ref 1.5–6.5)
PLATELETS: 133 10*3/uL — AB (ref 145–400)
RBC: 3.92 MIL/uL (ref 3.70–5.32)
RDW: 18 % — AB (ref 11.1–15.7)
WBC Count: 6.1 10*3/uL (ref 3.9–10.0)

## 2017-05-09 MED ORDER — DIPHENHYDRAMINE HCL 25 MG PO TABS
25.0000 mg | ORAL_TABLET | Freq: Once | ORAL | Status: AC
  Administered 2017-05-09: 25 mg via ORAL
  Filled 2017-05-09: qty 1

## 2017-05-09 MED ORDER — SODIUM CHLORIDE 0.9 % IV SOLN
750.0000 mg | Freq: Once | INTRAVENOUS | Status: AC
  Administered 2017-05-09: 750 mg via INTRAVENOUS
  Filled 2017-05-09: qty 15

## 2017-05-09 MED ORDER — DIPHENHYDRAMINE HCL 25 MG PO CAPS
ORAL_CAPSULE | ORAL | Status: AC
  Filled 2017-05-09: qty 1

## 2017-05-09 MED ORDER — DEXAMETHASONE SODIUM PHOSPHATE 10 MG/ML IJ SOLN
INTRAMUSCULAR | Status: AC
Start: 2017-05-09 — End: 2017-05-09
  Filled 2017-05-09: qty 1

## 2017-05-09 MED ORDER — FAMOTIDINE IN NACL 20-0.9 MG/50ML-% IV SOLN
20.0000 mg | Freq: Once | INTRAVENOUS | Status: AC
  Administered 2017-05-09: 20 mg via INTRAVENOUS

## 2017-05-09 MED ORDER — ACETAMINOPHEN 325 MG PO TABS
ORAL_TABLET | ORAL | Status: AC
  Filled 2017-05-09: qty 2

## 2017-05-09 MED ORDER — ACETAMINOPHEN 325 MG PO TABS
650.0000 mg | ORAL_TABLET | Freq: Once | ORAL | Status: AC
  Administered 2017-05-09: 650 mg via ORAL

## 2017-05-09 MED ORDER — SODIUM CHLORIDE 0.9 % IV SOLN
Freq: Once | INTRAVENOUS | Status: AC
  Administered 2017-05-09: 12:00:00 via INTRAVENOUS

## 2017-05-09 MED ORDER — FAMOTIDINE IN NACL 20-0.9 MG/50ML-% IV SOLN
INTRAVENOUS | Status: AC
  Filled 2017-05-09: qty 50

## 2017-05-09 MED ORDER — IMMUNE GLOBULIN (HUMAN) 20 GM/200ML IV SOLN
400.0000 mg/kg | Freq: Once | INTRAVENOUS | Status: AC
  Administered 2017-05-09: 30 g via INTRAVENOUS
  Filled 2017-05-09: qty 100

## 2017-05-09 MED ORDER — DEXAMETHASONE SODIUM PHOSPHATE 10 MG/ML IJ SOLN
10.0000 mg | Freq: Once | INTRAMUSCULAR | Status: AC
  Administered 2017-05-09: 10 mg via INTRAVENOUS

## 2017-05-09 MED ORDER — SODIUM CHLORIDE 0.9 % IV SOLN: 10.0000 mg | Freq: Once | INTRAVENOUS | Status: DC

## 2017-05-09 NOTE — Patient Instructions (Addendum)
Ferric carboxymaltose injection What is this medicine? FERRIC CARBOXYMALTOSE (ferr-ik car-box-ee-mol-toes) is an iron complex. Iron is used to make healthy red blood cells, which carry oxygen and nutrients throughout the body. This medicine is used to treat anemia in people with chronic kidney disease or people who cannot take iron by mouth. This medicine may be used for other purposes; ask your health care provider or pharmacist if you have questions. COMMON BRAND NAME(S): Injectafer What should I tell my health care provider before I take this medicine? They need to know if you have any of these conditions: -anemia not caused by low iron levels -high levels of iron in the blood -liver disease -an unusual or allergic reaction to iron, other medicines, foods, dyes, or preservatives -pregnant or trying to get pregnant -breast-feeding How should I use this medicine? This medicine is for infusion into a vein. It is given by a health care professional in a hospital or clinic setting. Talk to your pediatrician regarding the use of this medicine in children. Special care may be needed. Overdosage: If you think you have taken too much of this medicine contact a poison control center or emergency room at once. NOTE: This medicine is only for you. Do not share this medicine with others. What if I miss a dose? It is important not to miss your dose. Call your doctor or health care professional if you are unable to keep an appointment. What may interact with this medicine? Do not take this medicine with any of the following medications: -deferoxamine -dimercaprol -other iron products This medicine may also interact with the following medications: -chloramphenicol -deferasirox This list may not describe all possible interactions. Give your health care provider a list of all the medicines, herbs, non-prescription drugs, or dietary supplements you use. Also tell them if you smoke, drink alcohol, or  use illegal drugs. Some items may interact with your medicine. What should I watch for while using this medicine? Visit your doctor or health care professional regularly. Tell your doctor if your symptoms do not start to get better or if they get worse. You may need blood work done while you are taking this medicine. You may need to follow a special diet. Talk to your doctor. Foods that contain iron include: whole grains/cereals, dried fruits, beans, or peas, leafy green vegetables, and organ meats (liver, kidney). What side effects may I notice from receiving this medicine? Side effects that you should report to your doctor or health care professional as soon as possible: -allergic reactions like skin rash, itching or hives, swelling of the face, lips, or tongue -breathing problems -changes in blood pressure -feeling faint or lightheaded, falls -flushing, sweating, or hot feelings Side effects that usually do not require medical attention (report to your doctor or health care professional if they continue or are bothersome): -changes in taste -constipation -dizziness -headache -nausea -pain, redness, or irritation at site where injected -vomiting This list may not describe all possible side effects. Call your doctor for medical advice about side effects. You may report side effects to FDA at 1-800-FDA-1088. Where should I keep my medicine? This drug is given in a hospital or clinic and will not be stored at home. NOTE: This sheet is a summary. It may not cover all possible information. If you have questions about this medicine, talk to your doctor, pharmacist, or health care provider.  2018 Elsevier/Gold Standard (2015-04-17 11:20:47) Immune Globulin Injection What is this medicine? IMMUNE GLOBULIN (im MUNE GLOB yoo lin)  helps to prevent or reduce the severity of certain infections in patients who are at risk. This medicine is collected from the pooled blood of many donors. It is used to  treat immune system problems, thrombocytopenia, and Kawasaki syndrome. This medicine may be used for other purposes; ask your health care provider or pharmacist if you have questions. COMMON BRAND NAME(S): Baygam, BIVIGAM, Carimune, Carimune NF, Cuvitru, Flebogamma, Flebogamma DIF, GamaSTAN S/D, Gamimune N, Gammagard, Gammagard S/D, Gammaked, Gammaplex, Gammar-P IV, Gamunex, Gamunex-C, Hizentra, Iveegam, Iveegam EN, Octagam, Panglobulin, Panglobulin NF, Polygam S/D, Privigen, Sandoglobulin, Venoglobulin-S, Vigam, Vivaglobulin What should I tell my health care provider before I take this medicine? They need to know if you have any of these conditions: - diabetes - extremely low or no immune antibodies in the blood - heart disease - history of blood clots - hyperprolinemia - infection in the blood, sepsis - kidney disease - taking medicine that may change kidney function - ask your health care provider about your medicine - an unusual or allergic reaction to human immune globulin, albumin, maltose, sucrose, polysorbate 80, other medicines, foods, dyes, or preservatives - pregnant or trying to get pregnant - breast-feeding How should I use this medicine? This medicine is for injection into a muscle or infusion into a vein or skin. It is usually given by a health care professional in a hospital or clinic setting. In rare cases, some brands of this medicine might be given at home. You will be taught how to give this medicine. Use exactly as directed. Take your medicine at regular intervals. Do not take your medicine more often than directed. Talk to your pediatrician regarding the use of this medicine in children. Special care may be needed. Overdosage: If you think you have taken too much of this medicine contact a poison control center or emergency room at once. NOTE: This medicine is only for you. Do not share this medicine with others. What if I miss a dose? It is important not to  miss your dose. Call your doctor or health care professional if you are unable to keep an appointment. If you give yourself the medicine and you miss a dose, take it as soon as you can. If it is almost time for your next dose, take only that dose. Do not take double or extra doses. What may interact with this medicine? -aspirin and aspirin-like medicines -cisplatin -cyclosporine -medicines for infection like acyclovir, adefovir, amphotericin B, bacitracin, cidofovir, foscarnet, ganciclovir, gentamicin, pentamidine, vancomycin -NSAIDS, medicines for pain and inflammation, like ibuprofen or naproxen -pamidronate -vaccines -zoledronic acid This list may not describe all possible interactions. Give your health care provider a list of all the medicines, herbs, non-prescription drugs, or dietary supplements you use. Also tell them if you smoke, drink alcohol, or use illegal drugs. Some items may interact with your medicine. What should I watch for while using this medicine? Your condition will be monitored carefully while you are receiving this medicine. This medicine is made from pooled blood donations of many different people. It may be possible to pass an infection in this medicine. However, the donors are screened for infections and all products are tested for HIV and hepatitis. The medicine is treated to kill most or all bacteria and viruses. Talk to your doctor about the risks and benefits of this medicine. Do not have vaccinations for at least 14 days before, or until at least 3 months after receiving this medicine. What side effects may I notice from receiving this medicine?  Side effects that you should report to your doctor or health care professional as soon as possible: -allergic reactions like skin rash, itching or hives, swelling of the face, lips, or tongue -breathing problems -chest pain or tightness -fever, chills -headache with nausea, vomiting -neck pain or difficulty moving  neck -pain when moving eyes -pain, swelling, warmth in the leg -problems with balance, talking, walking -sudden weight gain -swelling of the ankles, feet, hands -trouble passing urine or change in the amount of urine Side effects that usually do not require medical attention (report to your doctor or health care professional if they continue or are bothersome): -dizzy, drowsy -flushing -increased sweating -leg cramps -muscle aches and pains -pain at site where injected This list may not describe all possible side effects. Call your doctor for medical advice about side effects. You may report side effects to FDA at 1-800-FDA-1088. Where should I keep my medicine? Keep out of the reach of children. This drug is usually given in a hospital or clinic and will not be stored at home. In rare cases, some brands of this medicine may be given at home. If you are using this medicine at home, you will be instructed on how to store this medicine. Throw away any unused medicine after the expiration date on the label. NOTE: This sheet is a summary. It may not cover all possible information. If you have questions about this medicine, talk to your doctor, pharmacist, or health care provider.  2018 Elsevier/Gold Standard (2008-06-05 11:44:49)

## 2017-05-16 DIAGNOSIS — H353212 Exudative age-related macular degeneration, right eye, with inactive choroidal neovascularization: Secondary | ICD-10-CM | POA: Diagnosis not present

## 2017-05-16 DIAGNOSIS — H353114 Nonexudative age-related macular degeneration, right eye, advanced atrophic with subfoveal involvement: Secondary | ICD-10-CM | POA: Diagnosis not present

## 2017-05-16 DIAGNOSIS — H353221 Exudative age-related macular degeneration, left eye, with active choroidal neovascularization: Secondary | ICD-10-CM | POA: Diagnosis not present

## 2017-05-20 ENCOUNTER — Other Ambulatory Visit: Payer: Self-pay

## 2017-05-20 ENCOUNTER — Inpatient Hospital Stay (HOSPITAL_BASED_OUTPATIENT_CLINIC_OR_DEPARTMENT_OTHER): Payer: Medicare HMO | Admitting: Hematology & Oncology

## 2017-05-20 ENCOUNTER — Encounter: Payer: Self-pay | Admitting: Hematology & Oncology

## 2017-05-20 ENCOUNTER — Inpatient Hospital Stay: Payer: Medicare HMO

## 2017-05-20 VITALS — BP 154/74 | HR 75 | Temp 98.0°F | Resp 19 | Wt 170.0 lb

## 2017-05-20 DIAGNOSIS — D500 Iron deficiency anemia secondary to blood loss (chronic): Secondary | ICD-10-CM

## 2017-05-20 DIAGNOSIS — C9110 Chronic lymphocytic leukemia of B-cell type not having achieved remission: Secondary | ICD-10-CM

## 2017-05-20 DIAGNOSIS — I509 Heart failure, unspecified: Secondary | ICD-10-CM | POA: Diagnosis not present

## 2017-05-20 DIAGNOSIS — Z79899 Other long term (current) drug therapy: Secondary | ICD-10-CM

## 2017-05-20 DIAGNOSIS — R011 Cardiac murmur, unspecified: Secondary | ICD-10-CM | POA: Diagnosis not present

## 2017-05-20 DIAGNOSIS — D801 Nonfamilial hypogammaglobulinemia: Secondary | ICD-10-CM | POA: Diagnosis not present

## 2017-05-20 DIAGNOSIS — I5031 Acute diastolic (congestive) heart failure: Secondary | ICD-10-CM

## 2017-05-20 DIAGNOSIS — D649 Anemia, unspecified: Secondary | ICD-10-CM

## 2017-05-20 LAB — CBC WITH DIFFERENTIAL (CANCER CENTER ONLY)
BASOS ABS: 0 10*3/uL (ref 0.0–0.1)
BASOS PCT: 0 %
EOS ABS: 0.1 10*3/uL (ref 0.0–0.5)
EOS PCT: 3 %
HEMATOCRIT: 33 % — AB (ref 34.8–46.6)
Hemoglobin: 10.3 g/dL — ABNORMAL LOW (ref 11.6–15.9)
Lymphocytes Relative: 33 %
Lymphs Abs: 1.2 10*3/uL (ref 0.9–3.3)
MCH: 26.4 pg (ref 26.0–34.0)
MCHC: 31.2 g/dL — AB (ref 32.0–36.0)
MCV: 84.6 fL (ref 81.0–101.0)
MONO ABS: 0.4 10*3/uL (ref 0.1–0.9)
Monocytes Relative: 10 %
NEUTROS ABS: 2 10*3/uL (ref 1.5–6.5)
Neutrophils Relative %: 54 %
PLATELETS: 113 10*3/uL — AB (ref 145–400)
RBC: 3.9 MIL/uL (ref 3.70–5.32)
RDW: 22.7 % — ABNORMAL HIGH (ref 11.1–15.7)
WBC Count: 3.7 10*3/uL — ABNORMAL LOW (ref 3.9–10.0)

## 2017-05-20 LAB — CMP (CANCER CENTER ONLY)
ALBUMIN: 3.7 g/dL (ref 3.5–5.0)
ALT: 12 U/L (ref 10–47)
ANION GAP: 2 — AB (ref 5–15)
AST: 15 U/L (ref 11–38)
Alkaline Phosphatase: 68 U/L (ref 26–84)
BUN: 12 mg/dL (ref 7–22)
CALCIUM: 9.7 mg/dL (ref 8.0–10.3)
CO2: 31 mmol/L (ref 18–33)
CREATININE: 0.7 mg/dL (ref 0.60–1.20)
Chloride: 108 mmol/L (ref 98–108)
GLUCOSE: 114 mg/dL (ref 73–118)
Potassium: 4.2 mmol/L (ref 3.3–4.7)
Sodium: 141 mmol/L (ref 128–145)
TOTAL PROTEIN: 6 g/dL — AB (ref 6.4–8.1)
Total Bilirubin: 0.9 mg/dL (ref 0.2–1.6)

## 2017-05-20 LAB — SAVE SMEAR

## 2017-05-20 LAB — LACTATE DEHYDROGENASE: LDH: 132 U/L (ref 125–245)

## 2017-05-20 NOTE — Progress Notes (Signed)
Hematology and Oncology Follow Up Visit  [name] [id number] 16-May-1928 84 y.o. 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 is back for followup.  She is still on supplemental oxygen.  I did take the oxygen off her for about 1/2-hour.  When I checked her oxygen level off the oxygen.  She has saturation of 89%.  As such, I told her that she probably needs to wear the oxygen most of the time.  She deftly needs to wear at nighttime when she is sleeping.  During the daytime, I would wear if she is active and out of the house.  She continues to stay off the [facility].  She is doing well off IMBRUVICA.  We did go ahead and give her a dose of IV iron.  She was quite fatigued.  Her iron stores back in early February showed a ferritin of only 16 with iron saturation of 19%.  Given all of her issues, this was quite low.  She got a dose of Injectafer.  This is making her feel a little bit better.  She did get IVIG a couple weeks ago.  This was to help try to help with her immune status.  When we checked her IgG level in December, it was 590.  Since she was hospitalized with with a bad Haemophilus bronchitis, I felt that the IVIG would be helpful.  She comes in with her daughter.  Her husband is not doing too well right now.  She has had no bleeding.  She is had no diarrhea.  She has had no incontinence.  Her appetite is picking up a little bit.  Overall, her performance status is ECOG 2.    Medications:  Current Outpatient Medications:  .  amLODipine (NORVASC) 5 MG tablet, Take 1 tablet (5 mg total) by mouth daily., Disp: 30 tablet, Rfl: 0 .  Ascorbic  Acid (VITAMIN C PO), Take 1 tablet by mouth daily., Disp: , Rfl:  .  fluticasone (FLONASE) 50 MCG/ACT nasal spray, Place 1 spray into both nostrils daily., Disp: 16 g, Rfl: 0 .  furosemide (LASIX) 40 MG tablet, Take 1 tablet (40 mg total) by mouth daily., Disp: 30 tablet, Rfl: 0 .  IMBRUVICA 420 MG TABS, TAKE 1 TABLET BY MOUTH DAILY, Disp: 28 tablet, Rfl: 0 .  loratadine (CLARITIN) 10 MG tablet, Take 1 tablet (10 mg total) by mouth daily., Disp: 15 tablet, Rfl: 0 .  oxymetazoline (AFRIN) 0.05 % nasal spray, Place 2 sprays into both nostrils 2 (two) times daily. For 5 more doses and then stop, Disp: 20 mL, Rfl: 0 .  potassium chloride (K-DUR) 10 MEQ tablet, Take 2 tablets (20 mEq total) by mouth daily., Disp: 30 tablet, Rfl: 0 .  prednisoLONE acetate (PRED FORTE) 1 % ophthalmic suspension, Place 1 drop into the right eye 2 (two) times daily. , Disp: , Rfl:  .  sodium chloride (OCEAN) 0.65 % SOLN nasal spray, Place 1 spray into both nostrils as needed for congestion., Disp: 30 mL, Rfl: 0  Allergies: No Known Allergies  Past Medical History.  Review of Systems: Review of Systems  Constitutional: Positive for malaise/fatigue.  HENT: Positive for  congestion and sore throat.   Eyes: Negative.   Respiratory: Positive for cough and shortness of breath.   Cardiovascular: Negative.   Gastrointestinal: Negative.   Genitourinary: Negative.   Musculoskeletal: Positive for joint pain.  Skin: Negative.   Neurological: Positive for tingling and weakness.  Endo/Heme/Allergies: Negative.   Psychiatric/Behavioral: The patient is nervous/anxious.     Physical Exam:  vitals were not taken for this visit.   Physical Exam  Constitutional: She is oriented to person, place, and time.  HENT:  Head: Normocephalic and atraumatic.  Mouth/Throat: Oropharynx is clear and moist.  Eyes: EOM are normal. Pupils are equal, round, and reactive to light.  Neck: Normal range of motion.  Cardiovascular: Normal rate,  regular rhythm and normal heart sounds.  Pulmonary/Chest: Effort normal and breath sounds normal.  Abdominal: Soft. Bowel sounds are normal.  Musculoskeletal: Normal range of motion. She exhibits no edema, tenderness or deformity.  Lymphadenopathy:    She has no cervical adenopathy.  Neurological: She is alert and oriented to person, place, and time.  Skin: Skin is warm and dry. No rash noted. No erythema.  Psychiatric: She has a normal mood and affect. Her behavior is normal. Judgment and thought content normal.  Vitals reviewed.  .  Lab Results  Component Value Date   WBC 3.7 (L) 05/20/2017   HGB 9.3 (L) 04/27/2017   HCT 33.0 (L) 05/20/2017   MCV 84.6 05/20/2017   PLT 113 (L) 05/20/2017     Chemistry      Component Value Date/Time   NA 142 05/09/2017 1144   NA 143 03/10/2017 1154   NA 140 10/15/2016 1155   K 3.8 05/09/2017 1144   K 4.5 03/10/2017 1154   K 4.1 10/15/2016 1155   CL 103 05/09/2017 1144   CL 103 03/10/2017 1154   CO2 29 05/09/2017 1144   CO2 31 03/10/2017 1154   CO2 27 10/15/2016 1155   BUN 22 05/09/2017 1144   BUN 12 03/10/2017 1154   BUN 18.6 10/15/2016 1155   CREATININE 0.70 05/09/2017 1144   CREATININE 0.7 03/10/2017 1154   CREATININE 0.7 10/15/2016 1155      Component Value Date/Time   CALCIUM 9.9 05/09/2017 1144   CALCIUM 9.9 03/10/2017 1154   CALCIUM 10.2 10/15/2016 1155   ALKPHOS 51 05/09/2017 1144   ALKPHOS 55 03/10/2017 1154   ALKPHOS 46 10/15/2016 1155   AST 17 05/09/2017 1144   AST 12 10/15/2016 1155   ALT 13 05/09/2017 1144   ALT 17 03/10/2017 1154   ALT 8 10/15/2016 1155   BILITOT 1.2 05/09/2017 1144   BILITOT 2.09 (H) 10/15/2016 1155       Impression and Plan: [name] is an 84 year old white female with CLL. She has stage C disease.   She does look better.  I am little surprised that her white cell count is down a little bit.  We will see what her iron stores show.  She still has a marginal performance status.  I do  not see any reason that we have to restart the [place].  I spent about 40 minutes with she and her daughter.  Over 50% of the time was spent face-to-face discussing all of the labs, making appointments for her, and trying to explain my recommendations.  She will get an echocardiogram.  She does have a significant heart murmur.  She does not have a cardiologist.  She rarely sees her family physician.  I encouraged her to try to see  her family doctor a little more often given that she has these issues now with her heart and lungs.  I will plan to see her back in another 4 or 5 weeks.  [name], MD 2/22/201911:16 AM

## 2017-05-21 LAB — IGG, IGA, IGM
IgA: 39 mg/dL — ABNORMAL LOW (ref 64–422)
IgG (Immunoglobin G), Serum: 858 mg/dL (ref 700–1600)
IgM (Immunoglobulin M), Srm: 73 mg/dL (ref 26–217)

## 2017-05-26 ENCOUNTER — Other Ambulatory Visit: Payer: Self-pay

## 2017-05-26 ENCOUNTER — Ambulatory Visit (HOSPITAL_COMMUNITY): Payer: Medicare HMO | Attending: Cardiovascular Disease

## 2017-05-26 DIAGNOSIS — R079 Chest pain, unspecified: Secondary | ICD-10-CM | POA: Insufficient documentation

## 2017-05-26 DIAGNOSIS — J9601 Acute respiratory failure with hypoxia: Secondary | ICD-10-CM | POA: Diagnosis not present

## 2017-05-26 DIAGNOSIS — I5031 Acute diastolic (congestive) heart failure: Secondary | ICD-10-CM

## 2017-05-26 DIAGNOSIS — I313 Pericardial effusion (noninflammatory): Secondary | ICD-10-CM | POA: Insufficient documentation

## 2017-05-26 DIAGNOSIS — I080 Rheumatic disorders of both mitral and aortic valves: Secondary | ICD-10-CM | POA: Insufficient documentation

## 2017-05-26 DIAGNOSIS — I110 Hypertensive heart disease with heart failure: Secondary | ICD-10-CM | POA: Insufficient documentation

## 2017-05-27 ENCOUNTER — Ambulatory Visit (HOSPITAL_COMMUNITY): Payer: Medicare HMO

## 2017-06-01 DIAGNOSIS — I5032 Chronic diastolic (congestive) heart failure: Secondary | ICD-10-CM | POA: Diagnosis not present

## 2017-06-01 DIAGNOSIS — Z9981 Dependence on supplemental oxygen: Secondary | ICD-10-CM | POA: Diagnosis not present

## 2017-06-01 DIAGNOSIS — R1310 Dysphagia, unspecified: Secondary | ICD-10-CM | POA: Diagnosis not present

## 2017-06-01 DIAGNOSIS — D649 Anemia, unspecified: Secondary | ICD-10-CM | POA: Diagnosis not present

## 2017-06-01 DIAGNOSIS — C9110 Chronic lymphocytic leukemia of B-cell type not having achieved remission: Secondary | ICD-10-CM | POA: Diagnosis not present

## 2017-06-01 DIAGNOSIS — J9610 Chronic respiratory failure, unspecified whether with hypoxia or hypercapnia: Secondary | ICD-10-CM | POA: Diagnosis not present

## 2017-06-01 DIAGNOSIS — I110 Hypertensive heart disease with heart failure: Secondary | ICD-10-CM | POA: Diagnosis not present

## 2017-06-01 DIAGNOSIS — H409 Unspecified glaucoma: Secondary | ICD-10-CM | POA: Diagnosis not present

## 2017-06-01 DIAGNOSIS — I4510 Unspecified right bundle-branch block: Secondary | ICD-10-CM | POA: Diagnosis not present

## 2017-06-01 DIAGNOSIS — I350 Nonrheumatic aortic (valve) stenosis: Secondary | ICD-10-CM | POA: Diagnosis not present

## 2017-06-08 ENCOUNTER — Other Ambulatory Visit: Payer: Medicare HMO

## 2017-06-08 ENCOUNTER — Ambulatory Visit: Payer: Medicare HMO | Admitting: Hematology & Oncology

## 2017-06-17 ENCOUNTER — Inpatient Hospital Stay: Payer: Medicare HMO | Attending: Hematology & Oncology | Admitting: Family

## 2017-06-17 ENCOUNTER — Inpatient Hospital Stay: Payer: Medicare HMO

## 2017-06-17 VITALS — BP 145/86 | HR 72 | Temp 98.0°F | Resp 18

## 2017-06-17 DIAGNOSIS — I5031 Acute diastolic (congestive) heart failure: Secondary | ICD-10-CM

## 2017-06-17 DIAGNOSIS — C9110 Chronic lymphocytic leukemia of B-cell type not having achieved remission: Secondary | ICD-10-CM | POA: Insufficient documentation

## 2017-06-17 DIAGNOSIS — Z79899 Other long term (current) drug therapy: Secondary | ICD-10-CM | POA: Diagnosis not present

## 2017-06-17 DIAGNOSIS — D801 Nonfamilial hypogammaglobulinemia: Secondary | ICD-10-CM

## 2017-06-17 DIAGNOSIS — R011 Cardiac murmur, unspecified: Secondary | ICD-10-CM | POA: Insufficient documentation

## 2017-06-17 DIAGNOSIS — I509 Heart failure, unspecified: Secondary | ICD-10-CM | POA: Diagnosis not present

## 2017-06-17 DIAGNOSIS — D649 Anemia, unspecified: Secondary | ICD-10-CM | POA: Diagnosis not present

## 2017-06-17 DIAGNOSIS — D729 Disorder of white blood cells, unspecified: Secondary | ICD-10-CM | POA: Diagnosis not present

## 2017-06-17 LAB — CBC WITH DIFFERENTIAL (CANCER CENTER ONLY)
BASOS PCT: 0 %
Basophils Absolute: 0 10*3/uL (ref 0.0–0.1)
Eosinophils Absolute: 0.1 10*3/uL (ref 0.0–0.5)
Eosinophils Relative: 2 %
HEMATOCRIT: 38.5 % (ref 34.8–46.6)
HEMOGLOBIN: 12.3 g/dL (ref 11.6–15.9)
LYMPHS ABS: 2.2 10*3/uL (ref 0.9–3.3)
LYMPHS PCT: 50 %
MCH: 28.1 pg (ref 26.0–34.0)
MCHC: 31.9 g/dL — ABNORMAL LOW (ref 32.0–36.0)
MCV: 88.1 fL (ref 81.0–101.0)
MONOS PCT: 7 %
Monocytes Absolute: 0.3 10*3/uL (ref 0.1–0.9)
NEUTROS ABS: 1.8 10*3/uL (ref 1.5–6.5)
NEUTROS PCT: 41 %
Platelet Count: 108 10*3/uL — ABNORMAL LOW (ref 145–400)
RBC: 4.37 MIL/uL (ref 3.70–5.32)
RDW: 18.8 % — ABNORMAL HIGH (ref 11.1–15.7)
WBC Count: 4.3 10*3/uL (ref 3.9–10.0)

## 2017-06-17 LAB — CMP (CANCER CENTER ONLY)
ALT: 16 U/L (ref 10–47)
ANION GAP: 6 (ref 5–15)
AST: 18 U/L (ref 11–38)
Albumin: 3.9 g/dL (ref 3.5–5.0)
Alkaline Phosphatase: 64 U/L (ref 26–84)
BILIRUBIN TOTAL: 0.9 mg/dL (ref 0.2–1.6)
BUN: 16 mg/dL (ref 7–22)
CHLORIDE: 107 mmol/L (ref 98–108)
CO2: 30 mmol/L (ref 18–33)
Calcium: 9.7 mg/dL (ref 8.0–10.3)
Creatinine: 0.6 mg/dL (ref 0.60–1.20)
Glucose, Bld: 100 mg/dL (ref 73–118)
POTASSIUM: 4.8 mmol/L — AB (ref 3.3–4.7)
Sodium: 143 mmol/L (ref 128–145)
Total Protein: 5.9 g/dL — ABNORMAL LOW (ref 6.4–8.1)

## 2017-06-17 LAB — FERRITIN: FERRITIN: 46 ng/mL (ref 9–269)

## 2017-06-17 LAB — LACTATE DEHYDROGENASE: LDH: 140 U/L (ref 125–245)

## 2017-06-17 LAB — SAVE SMEAR

## 2017-06-17 LAB — IRON AND TIBC
Iron: 45 ug/dL (ref 41–142)
Saturation Ratios: 20 % — ABNORMAL LOW (ref 21–57)
TIBC: 233 ug/dL — AB (ref 236–444)
UIBC: 187 ug/dL

## 2017-06-17 MED ORDER — IMMUNE GLOBULIN (HUMAN) 20 GM/200ML IV SOLN
40.0000 g | Freq: Once | INTRAVENOUS | Status: AC
  Administered 2017-06-17: 40 g via INTRAVENOUS
  Filled 2017-06-17: qty 400

## 2017-06-17 NOTE — Patient Instructions (Signed)

## 2017-06-18 LAB — IGG, IGA, IGM
IGA: 39 mg/dL — AB (ref 64–422)
IGM (IMMUNOGLOBULIN M), SRM: 70 mg/dL (ref 26–217)
IgG (Immunoglobin G), Serum: 776 mg/dL (ref 700–1600)

## 2017-06-20 DIAGNOSIS — I5032 Chronic diastolic (congestive) heart failure: Secondary | ICD-10-CM | POA: Diagnosis not present

## 2017-06-20 DIAGNOSIS — I10 Essential (primary) hypertension: Secondary | ICD-10-CM | POA: Diagnosis not present

## 2017-06-20 DIAGNOSIS — C9110 Chronic lymphocytic leukemia of B-cell type not having achieved remission: Secondary | ICD-10-CM | POA: Diagnosis not present

## 2017-06-20 DIAGNOSIS — R739 Hyperglycemia, unspecified: Secondary | ICD-10-CM | POA: Diagnosis not present

## 2017-06-25 DIAGNOSIS — J9601 Acute respiratory failure with hypoxia: Secondary | ICD-10-CM | POA: Diagnosis not present

## 2017-06-25 DIAGNOSIS — I5031 Acute diastolic (congestive) heart failure: Secondary | ICD-10-CM | POA: Diagnosis not present

## 2017-06-27 DIAGNOSIS — H5712 Ocular pain, left eye: Secondary | ICD-10-CM | POA: Diagnosis not present

## 2017-06-27 DIAGNOSIS — H3562 Retinal hemorrhage, left eye: Secondary | ICD-10-CM | POA: Diagnosis not present

## 2017-06-27 DIAGNOSIS — H353114 Nonexudative age-related macular degeneration, right eye, advanced atrophic with subfoveal involvement: Secondary | ICD-10-CM | POA: Diagnosis not present

## 2017-06-27 DIAGNOSIS — H353221 Exudative age-related macular degeneration, left eye, with active choroidal neovascularization: Secondary | ICD-10-CM | POA: Diagnosis not present

## 2017-06-27 DIAGNOSIS — Z711 Person with feared health complaint in whom no diagnosis is made: Secondary | ICD-10-CM | POA: Diagnosis not present

## 2017-07-22 ENCOUNTER — Encounter: Payer: Self-pay | Admitting: Family

## 2017-07-22 ENCOUNTER — Inpatient Hospital Stay: Payer: Medicare HMO

## 2017-07-22 ENCOUNTER — Inpatient Hospital Stay: Payer: Medicare HMO | Attending: Hematology & Oncology | Admitting: Family

## 2017-07-22 ENCOUNTER — Other Ambulatory Visit: Payer: Self-pay

## 2017-07-22 VITALS — BP 195/99 | HR 75 | Temp 97.9°F | Resp 18

## 2017-07-22 VITALS — BP 168/70 | HR 68 | Temp 97.8°F | Resp 16 | Wt 171.0 lb

## 2017-07-22 DIAGNOSIS — D801 Nonfamilial hypogammaglobulinemia: Secondary | ICD-10-CM

## 2017-07-22 DIAGNOSIS — Z79899 Other long term (current) drug therapy: Secondary | ICD-10-CM

## 2017-07-22 DIAGNOSIS — C9110 Chronic lymphocytic leukemia of B-cell type not having achieved remission: Secondary | ICD-10-CM | POA: Diagnosis not present

## 2017-07-22 DIAGNOSIS — D500 Iron deficiency anemia secondary to blood loss (chronic): Secondary | ICD-10-CM | POA: Diagnosis not present

## 2017-07-22 DIAGNOSIS — I509 Heart failure, unspecified: Secondary | ICD-10-CM | POA: Diagnosis not present

## 2017-07-22 LAB — CBC WITH DIFFERENTIAL (CANCER CENTER ONLY)
Basophils Absolute: 0 10*3/uL (ref 0.0–0.1)
Basophils Relative: 0 %
EOS PCT: 3 %
Eosinophils Absolute: 0.2 10*3/uL (ref 0.0–0.5)
HEMATOCRIT: 40.8 % (ref 34.8–46.6)
Hemoglobin: 13.2 g/dL (ref 11.6–15.9)
LYMPHS ABS: 2.8 10*3/uL (ref 0.9–3.3)
LYMPHS PCT: 52 %
MCH: 28.9 pg (ref 26.0–34.0)
MCHC: 32.4 g/dL (ref 32.0–36.0)
MCV: 89.3 fL (ref 81.0–101.0)
Monocytes Absolute: 0.4 10*3/uL (ref 0.1–0.9)
Monocytes Relative: 7 %
NEUTROS ABS: 2 10*3/uL (ref 1.5–6.5)
Neutrophils Relative %: 38 %
PLATELETS: 104 10*3/uL — AB (ref 145–400)
RBC: 4.57 MIL/uL (ref 3.70–5.32)
RDW: 16.4 % — ABNORMAL HIGH (ref 11.1–15.7)
WBC: 5.2 10*3/uL (ref 3.9–10.0)

## 2017-07-22 LAB — CMP (CANCER CENTER ONLY)
ALK PHOS: 67 U/L (ref 26–84)
ALT: 18 U/L (ref 10–47)
AST: 17 U/L (ref 11–38)
Albumin: 3.8 g/dL (ref 3.5–5.0)
Anion gap: 3 — ABNORMAL LOW (ref 5–15)
BILIRUBIN TOTAL: 1.1 mg/dL (ref 0.2–1.6)
BUN: 12 mg/dL (ref 7–22)
CALCIUM: 10.2 mg/dL (ref 8.0–10.3)
CHLORIDE: 107 mmol/L (ref 98–108)
CO2: 31 mmol/L (ref 18–33)
CREATININE: 0.7 mg/dL (ref 0.60–1.20)
Glucose, Bld: 111 mg/dL (ref 73–118)
Potassium: 4.5 mmol/L (ref 3.3–4.7)
Sodium: 141 mmol/L (ref 128–145)
TOTAL PROTEIN: 6.4 g/dL (ref 6.4–8.1)

## 2017-07-22 LAB — LACTATE DEHYDROGENASE: LDH: 151 U/L (ref 125–245)

## 2017-07-22 MED ORDER — IMMUNE GLOBULIN (HUMAN) 20 GM/200ML IV SOLN
40.0000 g | Freq: Once | INTRAVENOUS | Status: AC
  Administered 2017-07-22: 40 g via INTRAVENOUS
  Filled 2017-07-22: qty 400

## 2017-07-22 MED ORDER — DEXTROSE 5 % IV SOLN
INTRAVENOUS | Status: DC
  Administered 2017-07-22: 11:00:00 via INTRAVENOUS

## 2017-07-22 MED ORDER — IMMUNE GLOBULIN (HUMAN) 20 GM/200ML IV SOLN
40.0000 g | Freq: Once | INTRAVENOUS | Status: DC
Start: 2017-07-22 — End: 2017-07-22
  Filled 2017-07-22: qty 400

## 2017-07-22 NOTE — Progress Notes (Signed)
Hematology and Oncology Follow Up Visit  [name] [id number] 12/15/1928 84 y.o. 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 She is doing well but has noticed some lingering fatigue over the last 2 weeks. She has not been using her supplemental O2 at night because she states that cannula won't stay on her face and she does not want to tape it. I did advise she try to wears this at night if possible. She wears it during the day as needed. Her O2 sat now on RA is 95%.  No fall or syncopal episodes.  She continues to do well off of Imbruvica. No lymphadenopathy noted on exam. Hgb 13.2, WBC count 5.2 and platelet count 104.  IgG level was 776 in March. She has had a nice response to the IVIG and no recent flares of upper respiratory infection.  No fever, chills, n/v, cough, rash, dizziness, SOB, chest pain, palpitations, abdominal pain or changes in bowel or bladder habits.  No swelling, tenderness or tingling in her extremities at this time. She has intermittent numbness in her fingertips.  She has a good appetite and is staying well hydrated. Her weight is stable.   ECOG Performance Status: 1 - Symptomatic but completely ambulatory  Medications:  Allergies as of 07/22/2017   No Known Allergies     Medication List        Accurate as of 07/22/17  9:54 AM. Always use your most recent med list.          amLODipine 5 MG tablet Commonly known as:  NORVASC Take 1 tablet (5 mg total) by mouth daily.   fluticasone 50 MCG/ACT nasal spray Commonly known as:  FLONASE Place 1 spray into both nostrils daily.   furosemide 40 MG tablet Commonly known  as:  LASIX Take 1 tablet (40 mg total) by mouth daily.   IMBRUVICA 420 MG Tabs Generic drug:  Ibrutinib TAKE 1 TABLET BY MOUTH DAILY   KLOR-CON M10 10 MEQ tablet Generic drug:  potassium chloride Take 20 mEq by mouth daily.   loratadine 10 MG tablet Commonly known as:  CLARITIN Take 1 tablet (10 mg total) by mouth daily.   losartan 50 MG tablet Commonly known as:  COZAAR Take 50 mg by mouth daily.   oxymetazoline 0.05 % nasal spray Commonly known as:  AFRIN Place 2 sprays into both nostrils 2 (two) times daily. For 5 more doses and then stop   potassium chloride 10 MEQ tablet Commonly known as:  K-DUR Take 2 tablets (20 mEq total) by mouth daily.   prednisoLONE acetate 1 % ophthalmic suspension Commonly known as:  PRED FORTE Place 1 drop into the right eye 2 (two) times daily.   sodium chloride 0.65 % Soln nasal spray Commonly known as:  OCEAN Place 1 spray into both nostrils as needed for congestion.   VITAMIN C PO Take 1 tablet by mouth daily.       Allergies: No Known Allergies  Past Medical History, Surgical history, Social history, and Family History were reviewed and updated.  Review of Systems: All other 10 point review of systems is negative.   Physical Exam:  vitals were not taken for  this visit.   Wt Readings from Last 3 Encounters:  05/20/17 170 lb (77.1 kg)  04/29/17 171 lb 1.6 oz (77.6 kg)  04/27/17 169 lb 15.6 oz (77.1 kg)    Ocular: Sclerae unicteric, pupils equal, round and reactive to light Ear-nose-throat: Oropharynx clear, dentition fair Lymphatic: No cervical, supraclavicular or axillary adenopathy Lungs no rales or rhonchi, good excursion bilaterally Heart regular rate and rhythm, no murmur appreciated Abd soft, nontender, positive bowel sounds, no liver or spleen tip palpated on exam, no fluid wave  MSK no focal spinal tenderness, no joint edema Neuro: non-focal, well-oriented, appropriate affect Breasts: Deferred   Lab Results    Component Value Date   WBC 5.2 07/22/2017   HGB 13.2 07/22/2017   HCT 40.8 07/22/2017   MCV 89.3 07/22/2017   PLT 104 (L) 07/22/2017   Lab Results  Component Value Date   FERRITIN 46 06/17/2017   IRON 45 06/17/2017   TIBC 233 (L) 06/17/2017   UIBC 187 06/17/2017   IRONPCTSAT 20 (L) 06/17/2017   Lab Results  Component Value Date   RETICCTPCT 0.8 02/28/2008   RBC 4.57 07/22/2017   RETICCTABS 36.9 02/28/2008   No results found for: [facility]ils P[facility] Lab Results  Component Value Date   IGGSERUM 776 06/17/2017   IGA 39 (L) 06/17/2017   IGMSERUM 70 06/17/2017   Lab Results  Component Value Date   TOTALPROTELP 6.3 02/28/2008   ALBUMINELP 64.2 02/28/2008   A1GS 4.3 02/28/2008   A2GS 8.7 02/28/2008   BETS 5.9 02/28/2008   BETA2SER 4.1 02/28/2008   GAMS 12.8 02/28/2008   MSPIKE NOT DET 02/28/2008   SPEI * 02/28/2008     Chemistry      Component Value Date/Time   NA 143 06/17/2017 1105   NA 143 03/10/2017 1154   NA 140 10/15/2016 1155   K 4.8 (H) 06/17/2017 1105   K 4.5 03/10/2017 1154   K 4.1 10/15/2016 1155   CL 107 06/17/2017 1105   CL 103 03/10/2017 1154   CO2 30 06/17/2017 1105   CO2 31 03/10/2017 1154   CO2 27 10/15/2016 1155   BUN 16 06/17/2017 1105   BUN 12 03/10/2017 1154   BUN 18.6 10/15/2016 1155   CREATININE 0.60 06/17/2017 1105   CREATININE 0.7 03/10/2017 1154   CREATININE 0.7 10/15/2016 1155      Component Value Date/Time   CALCIUM 9.7 06/17/2017 1105   CALCIUM 9.9 03/10/2017 1154   CALCIUM 10.2 10/15/2016 1155   ALKPHOS 64 06/17/2017 1105   ALKPHOS 55 03/10/2017 1154   ALKPHOS 46 10/15/2016 1155   AST 18 06/17/2017 1105   AST 12 10/15/2016 1155   ALT 16 06/17/2017 1105   ALT 17 03/10/2017 1154   ALT 8 10/15/2016 1155   BILITOT 0.9 06/17/2017 1105   BILITOT 2.09 (H) 10/15/2016 1155      Impression and Plan: [name] is a very pleasant 84 yo caucasian female with CLL stage C. She is doing well and other than some  fatigue has no complaints.  We will proceed with IVIG today as planned. This has significantly imropoved her immune system and prevented frequent bouts of respiratory infection.  We will see what her iron studies show and bring her back in for infusion if needed.  She will remain off of Imbruvica.  We will plan to see her back in another month for follow-up and treatment.  She will contact our office with any questions or concerns. We can certainly  see her sooner if need be.   Laverna Peace, NP 4/26/20199:54 AM

## 2017-07-22 NOTE — Progress Notes (Signed)
Dr. Marin Olp aware of pt.'s BP. Pt states she did not take her bp medicine this morning and that she will take it as soon as soon as she gets home.

## 2017-07-25 LAB — FLOW CYTOMETRY

## 2017-07-26 DIAGNOSIS — J9601 Acute respiratory failure with hypoxia: Secondary | ICD-10-CM | POA: Diagnosis not present

## 2017-07-26 DIAGNOSIS — I5031 Acute diastolic (congestive) heart failure: Secondary | ICD-10-CM | POA: Diagnosis not present

## 2017-08-03 DIAGNOSIS — R7309 Other abnormal glucose: Secondary | ICD-10-CM | POA: Diagnosis not present

## 2017-08-03 DIAGNOSIS — I5032 Chronic diastolic (congestive) heart failure: Secondary | ICD-10-CM | POA: Diagnosis not present

## 2017-08-03 DIAGNOSIS — C9110 Chronic lymphocytic leukemia of B-cell type not having achieved remission: Secondary | ICD-10-CM | POA: Diagnosis not present

## 2017-08-03 DIAGNOSIS — I10 Essential (primary) hypertension: Secondary | ICD-10-CM | POA: Diagnosis not present

## 2017-08-16 DIAGNOSIS — H353221 Exudative age-related macular degeneration, left eye, with active choroidal neovascularization: Secondary | ICD-10-CM | POA: Diagnosis not present

## 2017-08-16 DIAGNOSIS — H3562 Retinal hemorrhage, left eye: Secondary | ICD-10-CM | POA: Diagnosis not present

## 2017-08-16 DIAGNOSIS — H353212 Exudative age-related macular degeneration, right eye, with inactive choroidal neovascularization: Secondary | ICD-10-CM | POA: Diagnosis not present

## 2017-08-16 DIAGNOSIS — H353114 Nonexudative age-related macular degeneration, right eye, advanced atrophic with subfoveal involvement: Secondary | ICD-10-CM | POA: Diagnosis not present

## 2017-08-19 ENCOUNTER — Other Ambulatory Visit: Payer: Self-pay

## 2017-08-19 ENCOUNTER — Inpatient Hospital Stay: Payer: Medicare HMO

## 2017-08-19 ENCOUNTER — Encounter: Payer: Self-pay | Admitting: Family

## 2017-08-19 ENCOUNTER — Inpatient Hospital Stay: Payer: Medicare HMO | Attending: Hematology & Oncology | Admitting: Family

## 2017-08-19 VITALS — BP 156/88 | HR 73 | Temp 98.1°F | Resp 19 | Wt 166.0 lb

## 2017-08-19 DIAGNOSIS — D500 Iron deficiency anemia secondary to blood loss (chronic): Secondary | ICD-10-CM | POA: Diagnosis not present

## 2017-08-19 DIAGNOSIS — C9110 Chronic lymphocytic leukemia of B-cell type not having achieved remission: Secondary | ICD-10-CM

## 2017-08-19 DIAGNOSIS — D801 Nonfamilial hypogammaglobulinemia: Secondary | ICD-10-CM

## 2017-08-19 DIAGNOSIS — Z79899 Other long term (current) drug therapy: Secondary | ICD-10-CM | POA: Diagnosis not present

## 2017-08-19 DIAGNOSIS — I509 Heart failure, unspecified: Secondary | ICD-10-CM | POA: Diagnosis not present

## 2017-08-19 LAB — CBC WITH DIFFERENTIAL (CANCER CENTER ONLY)
BASOS ABS: 0 10*3/uL (ref 0.0–0.1)
Basophils Relative: 0 %
Eosinophils Absolute: 0.3 10*3/uL (ref 0.0–0.5)
Eosinophils Relative: 5 %
HCT: 40.9 % (ref 34.8–46.6)
Hemoglobin: 13.4 g/dL (ref 11.6–15.9)
Lymphocytes Relative: 51 %
Lymphs Abs: 3.2 10*3/uL (ref 0.9–3.3)
MCH: 28.9 pg (ref 26.0–34.0)
MCHC: 32.8 g/dL (ref 32.0–36.0)
MCV: 88.3 fL (ref 81.0–101.0)
MONO ABS: 0.4 10*3/uL (ref 0.1–0.9)
Monocytes Relative: 7 %
Neutro Abs: 2.3 10*3/uL (ref 1.5–6.5)
Neutrophils Relative %: 37 %
Platelet Count: 113 10*3/uL — ABNORMAL LOW (ref 145–400)
RBC: 4.63 MIL/uL (ref 3.70–5.32)
RDW: 15.3 % (ref 11.1–15.7)
WBC Count: 6.3 10*3/uL (ref 3.9–10.0)

## 2017-08-19 LAB — CMP (CANCER CENTER ONLY)
ALT: 14 U/L (ref 10–47)
ANION GAP: 1 — AB (ref 5–15)
AST: 17 U/L (ref 11–38)
Albumin: 4.1 g/dL (ref 3.5–5.0)
Alkaline Phosphatase: 62 U/L (ref 26–84)
BILIRUBIN TOTAL: 1.1 mg/dL (ref 0.2–1.6)
BUN: 15 mg/dL (ref 7–22)
CALCIUM: 10.1 mg/dL (ref 8.0–10.3)
CO2: 32 mmol/L (ref 18–33)
Chloride: 107 mmol/L (ref 98–108)
Creatinine: 0.7 mg/dL (ref 0.60–1.20)
Glucose, Bld: 101 mg/dL (ref 73–118)
Potassium: 5 mmol/L — ABNORMAL HIGH (ref 3.3–4.7)
Sodium: 140 mmol/L (ref 128–145)
TOTAL PROTEIN: 6.4 g/dL (ref 6.4–8.1)

## 2017-08-19 LAB — IRON AND TIBC
IRON: 55 ug/dL (ref 41–142)
SATURATION RATIOS: 22 % (ref 21–57)
TIBC: 248 ug/dL (ref 236–444)
UIBC: 193 ug/dL

## 2017-08-19 LAB — FERRITIN: Ferritin: 47 ng/mL (ref 9–269)

## 2017-08-19 LAB — LACTATE DEHYDROGENASE: LDH: 141 U/L (ref 125–245)

## 2017-08-19 NOTE — Progress Notes (Signed)
Hematology and Oncology Follow Up Visit  [name] [id number] [birth year] 84 y.o. 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 Unfortunately, there is a shortage of IVIG right now so she will not be getting her infusion. We did do follow-up labs today with her follow-up.  Hgb is stable at 13.4, WBC count 6.3 and platelet count 113.  She has had no issue with infections. No fever, chills, n/v, cough, rash, dizziness, SOB, chest pain, palpitations, abdominal pain or changes in bowel or bowel habits.  She has generalized aches and pains due to arthritis. No swelling, numbness or tingling in her extremities at this time.  No lymphadenopathy noted on exam.  No episodes of bleeding. Her skin is thin and she does bruise easily on her arms and hands.  She has a good appetite and is staying well hydrated. Her weight is stable.  She has been busy in her yard getting her flower beds ready. The heat can be hard on her at times and she will take breaks to rest as needed.   ECOG Performance Status: 1 - Symptomatic but completely ambulatory  Medications:  Allergies as of 08/19/2017   No Known Allergies     Medication List        Accurate as of 08/19/17  9:28 AM. Always use your most recent med list.          amLODipine 5 MG tablet Commonly known as:  NORVASC Take 1 tablet (5 mg total) by mouth daily.   fluticasone 50 MCG/ACT nasal spray Commonly known as:  FLONASE Place 1 spray into both nostrils daily.   furosemide 40 MG tablet Commonly known as:  LASIX Take 1 tablet (40 mg total) by mouth daily.   IMBRUVICA 420 MG Tabs Generic drug:   Ibrutinib TAKE 1 TABLET BY MOUTH DAILY   KLOR-CON M10 10 MEQ tablet Generic drug:  potassium chloride Take 20 mEq by mouth daily.   loratadine 10 MG tablet Commonly known as:  CLARITIN Take 1 tablet (10 mg total) by mouth daily.   losartan 50 MG tablet Commonly known as:  COZAAR Take 50 mg by mouth daily.   oxymetazoline 0.05 % nasal spray Commonly known as:  AFRIN Place 2 sprays into both nostrils 2 (two) times daily. For 5 more doses and then stop   potassium chloride 10 MEQ tablet Commonly known as:  K-DUR Take 2 tablets (20 mEq total) by mouth daily.   prednisoLONE acetate 1 % ophthalmic suspension Commonly known as:  PRED FORTE Place 1 drop into the right eye 2 (two) times daily.   sodium chloride 0.65 % Soln nasal spray Commonly known as:  OCEAN Place 1 spray into both nostrils as needed for congestion.   VITAMIN C PO Take 1 tablet by mouth daily.       Allergies: No Known Allergies  Past Medical History, Surgical history, Social history, and Family History were reviewed and updated.  Review of Systems: All other 10 point review of systems is negative.   Physical Exam:  vitals were not taken for this visit.   Wt Readings from Last 3 Encounters:  07/22/17 171 lb (77.6 kg)  05/20/17 170 lb (77.1  kg)  04/29/17 171 lb 1.6 oz (77.6 kg)    Ocular: Sclerae unicteric, pupils equal, round and reactive to light Ear-nose-throat: Oropharynx clear, dentition fair Lymphatic: No cervical, supraclavicular or axillary adenopathy Lungs no rales or rhonchi, good excursion bilaterally Heart regular rate and rhythm, no murmur appreciated Abd soft, nontender, positive bowel sounds, no liver or spleen tip palpated on exam, no fluid wave  MSK no focal spinal tenderness, no joint edema Neuro: non-focal, well-oriented, appropriate affect Breasts: Deferred   Lab Results  Component Value Date   WBC 6.3 08/19/2017   HGB 13.4 08/19/2017   HCT 40.9 08/19/2017   MCV 88.3  08/19/2017   PLT 113 (L) 08/19/2017   Lab Results  Component Value Date   FERRITIN 46 06/17/2017   IRON 45 06/17/2017   TIBC 233 (L) 06/17/2017   UIBC 187 06/17/2017   IRONPCTSAT 20 (L) 06/17/2017   Lab Results  Component Value Date   RETICCTPCT 0.8 02/28/2008   RBC 4.63 08/19/2017   RETICCTABS 36.9 02/28/2008   No results found for: Nils Pyle [facility] Lab Results  Component Value Date   IGGSERUM 776 06/17/2017   IGA 39 (L) 06/17/2017   IGMSERUM 70 06/17/2017   Lab Results  Component Value Date   TOTALPROTELP 6.3 02/28/2008   ALBUMINELP 64.2 02/28/2008   A1GS 4.3 02/28/2008   A2GS 8.7 02/28/2008   BETS 5.9 02/28/2008   BETA2SER 4.1 02/28/2008   GAMS 12.8 02/28/2008   MSPIKE NOT DET 02/28/2008   SPEI * 02/28/2008     Chemistry      Component Value Date/Time   NA 141 07/22/2017 0940   NA 143 03/10/2017 1154   NA 140 10/15/2016 1155   K 4.5 07/22/2017 0940   K 4.5 03/10/2017 1154   K 4.1 10/15/2016 1155   CL 107 07/22/2017 0940   CL 103 03/10/2017 1154   CO2 31 07/22/2017 0940   CO2 31 03/10/2017 1154   CO2 27 10/15/2016 1155   BUN 12 07/22/2017 0940   BUN 12 03/10/2017 1154   BUN 18.6 10/15/2016 1155   CREATININE 0.70 07/22/2017 0940   CREATININE 0.7 03/10/2017 1154   CREATININE 0.7 10/15/2016 1155      Component Value Date/Time   CALCIUM 10.2 07/22/2017 0940   CALCIUM 9.9 03/10/2017 1154   CALCIUM 10.2 10/15/2016 1155   ALKPHOS 67 07/22/2017 0940   ALKPHOS 55 03/10/2017 1154   ALKPHOS 46 10/15/2016 1155   AST 17 07/22/2017 0940   AST 12 10/15/2016 1155   ALT 18 07/22/2017 0940   ALT 17 03/10/2017 1154   ALT 8 10/15/2016 1155   BILITOT 1.1 07/22/2017 0940   BILITOT 2.09 (H) 10/15/2016 1155      Impression and Plan: [name] is a pleasant 84 yo caucasian female with CLL stage C and acquired hypogammaglobulinemia.  She continues to do well and has no complaints at this time. No recent infections.  We are having an IVIG  shortage so she will not get her infusion today.  We will see what her iron studies show and bring her back for infusion if needed.  We will plan to see her back again in another month for follow-up and hopefully infusion.  She will contact our office with any questions or concerns. We can certainly see her sooner if need be.   Laverna Peace, NP 5/24/20199:28 AM\

## 2017-08-20 LAB — IGG, IGA, IGM
IGA: 40 mg/dL — AB (ref 64–422)
IGG (IMMUNOGLOBIN G), SERUM: 1096 mg/dL (ref 700–1600)
IgM (Immunoglobulin M), Srm: 66 mg/dL (ref 26–217)

## 2017-08-25 DIAGNOSIS — J9601 Acute respiratory failure with hypoxia: Secondary | ICD-10-CM | POA: Diagnosis not present

## 2017-08-25 DIAGNOSIS — I5031 Acute diastolic (congestive) heart failure: Secondary | ICD-10-CM | POA: Diagnosis not present

## 2017-09-16 ENCOUNTER — Other Ambulatory Visit: Payer: Self-pay

## 2017-09-16 ENCOUNTER — Inpatient Hospital Stay: Payer: Medicare HMO

## 2017-09-16 ENCOUNTER — Inpatient Hospital Stay: Payer: Medicare HMO | Attending: Hematology & Oncology | Admitting: Hematology & Oncology

## 2017-09-16 ENCOUNTER — Encounter: Payer: Self-pay | Admitting: Hematology & Oncology

## 2017-09-16 ENCOUNTER — Ambulatory Visit: Payer: Medicare HMO

## 2017-09-16 VITALS — BP 174/77 | HR 73 | Temp 97.6°F | Resp 18 | Wt 164.0 lb

## 2017-09-16 DIAGNOSIS — Z79899 Other long term (current) drug therapy: Secondary | ICD-10-CM | POA: Insufficient documentation

## 2017-09-16 DIAGNOSIS — C9110 Chronic lymphocytic leukemia of B-cell type not having achieved remission: Secondary | ICD-10-CM

## 2017-09-16 DIAGNOSIS — D801 Nonfamilial hypogammaglobulinemia: Secondary | ICD-10-CM | POA: Diagnosis not present

## 2017-09-16 DIAGNOSIS — D500 Iron deficiency anemia secondary to blood loss (chronic): Secondary | ICD-10-CM | POA: Diagnosis not present

## 2017-09-16 LAB — CBC WITH DIFFERENTIAL (CANCER CENTER ONLY)
BASOS ABS: 0 10*3/uL (ref 0.0–0.1)
BASOS PCT: 0 %
EOS PCT: 6 %
Eosinophils Absolute: 0.4 10*3/uL (ref 0.0–0.5)
HCT: 41.3 % (ref 34.8–46.6)
Hemoglobin: 13.3 g/dL (ref 11.6–15.9)
Lymphocytes Relative: 51 %
Lymphs Abs: 3 10*3/uL (ref 0.9–3.3)
MCH: 28 pg (ref 26.0–34.0)
MCHC: 32.2 g/dL (ref 32.0–36.0)
MCV: 86.9 fL (ref 81.0–101.0)
MONO ABS: 0.3 10*3/uL (ref 0.1–0.9)
MONOS PCT: 6 %
Neutro Abs: 2.2 10*3/uL (ref 1.5–6.5)
Neutrophils Relative %: 37 %
PLATELETS: 116 10*3/uL — AB (ref 145–400)
RBC: 4.75 MIL/uL (ref 3.70–5.32)
RDW: 14.6 % (ref 11.1–15.7)
WBC Count: 5.9 10*3/uL (ref 3.9–10.0)

## 2017-09-16 LAB — CMP (CANCER CENTER ONLY)
ALBUMIN: 4.1 g/dL (ref 3.5–5.0)
ALT: 18 U/L (ref 10–47)
ANION GAP: 8 (ref 5–15)
AST: 17 U/L (ref 11–38)
Alkaline Phosphatase: 64 U/L (ref 26–84)
BILIRUBIN TOTAL: 1.1 mg/dL (ref 0.2–1.6)
BUN: 14 mg/dL (ref 7–22)
CHLORIDE: 103 mmol/L (ref 98–108)
CO2: 33 mmol/L (ref 18–33)
Calcium: 10.4 mg/dL — ABNORMAL HIGH (ref 8.0–10.3)
Creatinine: 0.5 mg/dL — ABNORMAL LOW (ref 0.60–1.20)
GLUCOSE: 105 mg/dL (ref 73–118)
Potassium: 4.2 mmol/L (ref 3.3–4.7)
Sodium: 144 mmol/L (ref 128–145)
Total Protein: 6.4 g/dL (ref 6.4–8.1)

## 2017-09-16 LAB — LACTATE DEHYDROGENASE: LDH: 150 U/L (ref 125–245)

## 2017-09-16 MED ORDER — FUROSEMIDE 40 MG PO TABS: 40.0000 mg | ORAL_TABLET | Freq: Two times a day (BID) | ORAL | 0 refills | Status: DC

## 2017-09-16 NOTE — Progress Notes (Signed)
Hematology and Oncology Follow Up Visit  [name] [id number] [birth year] 84 y.o. 09/16/2017   Principle Diagnosis:  Chronic lymphocytic leukemia-stage C Hypogammaglobulinemia-acquired Congestive Heart Failure Iron deficiency anemia secondary to bleeding   Current Therapy:    Imbruvica 420mg  po q day -on hold since 04/18/2017  IVIG-first dose given on 05/04/2017 - q month  IV iron with Injectafer-dose given on 05/09/2017      Interim History:  Ms.  [name] is back for followup.  Unfortunately, she had just gotten back from [place].  1 of her daughters passed away from uterine cancer.  She was 82 years old.  I know this is quite tough on her.  Thankfully, she comes in with 1 of her other daughters.  Thankfully, her CLL has not done anything while off Imbruvica.  She has been off Imbruvica for 6 months.  I am just happy that the CLL has been holding steady.  She does have a little bit of unsteadiness.  This is been chronic.  She is had no fever.  She is had no cough.  She is had some slight leg swelling.  She had been on Lasix.  She ran out of her prescription.  I went ahead and refilled it for her.  She is had no nausea or vomiting.  She is looking forward to the summer.  She does some gardening.  Overall, her performance status is ECOG 2.   Medications:  Current Outpatient Medications:  .  amLODipine (NORVASC) 5 MG tablet, Take 1 tablet (5 mg total) by mouth daily., Disp: 30 tablet, Rfl: 0 .  Ascorbic Acid (VITAMIN C PO), Take 1 tablet by mouth daily., Disp: , Rfl:  .  fluticasone (FLONASE) 50 MCG/ACT nasal spray, Place 1 spray into both nostrils daily., Disp: 16 g, Rfl: 0 .  furosemide (LASIX) 40 MG tablet, Take 1 tablet (40 mg total) by mouth daily., Disp: 30 tablet, Rfl: 0 .  IMBRUVICA 420 MG TABS, TAKE 1 TABLET BY MOUTH DAILY, Disp: 28 tablet, Rfl: 0 .  KLOR-CON M10 10 MEQ tablet, Take 20 mEq by mouth daily., Disp: , Rfl: 0 .  loratadine (CLARITIN) 10 MG tablet,  Take 1 tablet (10 mg total) by mouth daily., Disp: 15 tablet, Rfl: 0 .  losartan (COZAAR) 50 MG tablet, Take 50 mg by mouth daily., Disp: , Rfl:  .  oxymetazoline (AFRIN) 0.05 % nasal spray, Place 2 sprays into both nostrils 2 (two) times daily. For 5 more doses and then stop, Disp: 20 mL, Rfl: 0 .  potassium chloride (K-DUR) 10 MEQ tablet, Take 2 tablets (20 mEq total) by mouth daily., Disp: 30 tablet, Rfl: 0 .  prednisoLONE acetate (PRED FORTE) 1 % ophthalmic suspension, Place 1 drop into the right eye 2 (two) times daily. , Disp: , Rfl:  .  sodium chloride (OCEAN) 0.65 % SOLN nasal spray, Place 1 spray into both nostrils as needed for congestion., Disp: 30 mL, Rfl: 0  Allergies: No Known Allergies  Past Medical History.  Review of Systems: Review of Systems  Constitutional: Positive for malaise/fatigue.  HENT: Positive for congestion and sore throat.   Eyes: Negative.   Respiratory: Positive for cough and shortness of breath.   Cardiovascular: Negative.   Gastrointestinal: Negative.   Genitourinary: Negative.   Musculoskeletal: Positive for joint pain.  Skin: Negative.   Neurological: Positive for tingling and weakness.  Endo/Heme/Allergies: Negative.   Psychiatric/Behavioral: The patient is nervous/anxious.     Physical Exam:  weight is  164 lb (74.4 kg). Her oral temperature is 97.6 F (36.4 C). Her blood pressure is 174/77 (abnormal) and her pulse is 73. Her respiration is 18 and oxygen saturation is 97%.   Physical Exam  Constitutional: She is oriented to person, place, and time.  HENT:  Head: Normocephalic and atraumatic.  Mouth/Throat: Oropharynx is clear and moist.  Eyes: Pupils are equal, round, and reactive to light. EOM are normal.  Neck: Normal range of motion.  Cardiovascular: Normal rate, regular rhythm and normal heart sounds.  Pulmonary/Chest: Effort normal and breath sounds normal.  Abdominal: Soft. Bowel sounds are normal.  Musculoskeletal: Normal range of  motion. She exhibits no edema, tenderness or deformity.  Lymphadenopathy:    She has no cervical adenopathy.  Neurological: She is alert and oriented to person, place, and time.  Skin: Skin is warm and dry. No rash noted. No erythema.  Psychiatric: She has a normal mood and affect. Her behavior is normal. Judgment and thought content normal.  Vitals reviewed.  .  Lab Results  Component Value Date   WBC 5.9 09/16/2017   HGB 13.3 09/16/2017   HCT 41.3 09/16/2017   MCV 86.9 09/16/2017   PLT 116 (L) 09/16/2017     Chemistry      Component Value Date/Time   NA 144 09/16/2017 1005   NA 143 03/10/2017 1154   NA 140 10/15/2016 1155   K 4.2 09/16/2017 1005   K 4.5 03/10/2017 1154   K 4.1 10/15/2016 1155   CL 103 09/16/2017 1005   CL 103 03/10/2017 1154   CO2 33 09/16/2017 1005   CO2 31 03/10/2017 1154   CO2 27 10/15/2016 1155   BUN 14 09/16/2017 1005   BUN 12 03/10/2017 1154   BUN 18.6 10/15/2016 1155   CREATININE 0.50 (L) 09/16/2017 1005   CREATININE 0.7 03/10/2017 1154   CREATININE 0.7 10/15/2016 1155      Component Value Date/Time   CALCIUM 10.4 (H) 09/16/2017 1005   CALCIUM 9.9 03/10/2017 1154   CALCIUM 10.2 10/15/2016 1155   ALKPHOS 64 09/16/2017 1005   ALKPHOS 55 03/10/2017 1154   ALKPHOS 46 10/15/2016 1155   AST 17 09/16/2017 1005   AST 12 10/15/2016 1155   ALT 18 09/16/2017 1005   ALT 17 03/10/2017 1154   ALT 8 10/15/2016 1155   BILITOT 1.1 09/16/2017 1005   BILITOT 2.09 (H) 10/15/2016 1155       Impression and Plan: [name] is an 84 year old white female with CLL. She has stage C disease.   We will continue to follow her off therapy.  I am just happy that she has done well.  We will see her back in 6 weeks.  We are holding her immunoglobulin infusion.  There is the shortage.  Her last IgG level was 1100 mg/dL.  As such, we have some flexibility to hold her therapy.   [name], MD 6/21/201911:42 AM

## 2017-09-17 LAB — IGG, IGA, IGM
IgA: 42 mg/dL — ABNORMAL LOW (ref 64–422)
IgG (Immunoglobin G), Serum: 945 mg/dL (ref 700–1600)
IgM (Immunoglobulin M), Srm: 76 mg/dL (ref 26–217)

## 2017-09-19 ENCOUNTER — Telehealth: Payer: Self-pay | Admitting: *Deleted

## 2017-09-19 LAB — IRON AND TIBC
Iron: 54 ug/dL (ref 41–142)
SATURATION RATIOS: 22 % (ref 21–57)
TIBC: 250 ug/dL (ref 236–444)
UIBC: 196 ug/dL

## 2017-09-19 LAB — FERRITIN: Ferritin: 59 ng/mL (ref 9–269)

## 2017-09-19 NOTE — Telephone Encounter (Addendum)
Patient is aware of results   ----- Message from Bridget Napoleon, MD sent at 09/19/2017 11:40 AM EDT ----- Call - iron is ok!!  Laurey Arrow

## 2017-09-20 LAB — PROTEIN ELECTROPHORESIS, SERUM, WITH REFLEX
A/G RATIO SPE: 1.5 (ref 0.7–1.7)
ALBUMIN ELP: 3.8 g/dL (ref 2.9–4.4)
ALPHA-1-GLOBULIN: 0.2 g/dL (ref 0.0–0.4)
Alpha-2-Globulin: 0.5 g/dL (ref 0.4–1.0)
Beta Globulin: 0.6 g/dL — ABNORMAL LOW (ref 0.7–1.3)
Gamma Globulin: 1.1 g/dL (ref 0.4–1.8)
Globulin, Total: 2.5 g/dL (ref 2.2–3.9)
TOTAL PROTEIN ELP: 6.3 g/dL (ref 6.0–8.5)

## 2017-09-25 DIAGNOSIS — J9601 Acute respiratory failure with hypoxia: Secondary | ICD-10-CM | POA: Diagnosis not present

## 2017-09-25 DIAGNOSIS — I5031 Acute diastolic (congestive) heart failure: Secondary | ICD-10-CM | POA: Diagnosis not present

## 2017-10-08 ENCOUNTER — Other Ambulatory Visit: Payer: Self-pay | Admitting: Hematology & Oncology

## 2017-10-19 DIAGNOSIS — H353114 Nonexudative age-related macular degeneration, right eye, advanced atrophic with subfoveal involvement: Secondary | ICD-10-CM | POA: Diagnosis not present

## 2017-10-19 DIAGNOSIS — H353123 Nonexudative age-related macular degeneration, left eye, advanced atrophic without subfoveal involvement: Secondary | ICD-10-CM | POA: Diagnosis not present

## 2017-10-19 DIAGNOSIS — H353212 Exudative age-related macular degeneration, right eye, with inactive choroidal neovascularization: Secondary | ICD-10-CM | POA: Diagnosis not present

## 2017-10-19 DIAGNOSIS — H353221 Exudative age-related macular degeneration, left eye, with active choroidal neovascularization: Secondary | ICD-10-CM | POA: Diagnosis not present

## 2017-10-19 DIAGNOSIS — H35361 Drusen (degenerative) of macula, right eye: Secondary | ICD-10-CM | POA: Diagnosis not present

## 2017-10-25 DIAGNOSIS — J9601 Acute respiratory failure with hypoxia: Secondary | ICD-10-CM | POA: Diagnosis not present

## 2017-10-25 DIAGNOSIS — I5031 Acute diastolic (congestive) heart failure: Secondary | ICD-10-CM | POA: Diagnosis not present

## 2017-10-28 ENCOUNTER — Inpatient Hospital Stay: Payer: Medicare HMO

## 2017-10-28 ENCOUNTER — Other Ambulatory Visit: Payer: Self-pay

## 2017-10-28 ENCOUNTER — Encounter: Payer: Self-pay | Admitting: Hematology & Oncology

## 2017-10-28 ENCOUNTER — Inpatient Hospital Stay: Payer: Medicare HMO | Attending: Hematology & Oncology | Admitting: Hematology & Oncology

## 2017-10-28 VITALS — BP 172/79 | HR 68 | Temp 97.9°F | Resp 18 | Wt 162.0 lb

## 2017-10-28 DIAGNOSIS — D801 Nonfamilial hypogammaglobulinemia: Secondary | ICD-10-CM

## 2017-10-28 DIAGNOSIS — D500 Iron deficiency anemia secondary to blood loss (chronic): Secondary | ICD-10-CM

## 2017-10-28 DIAGNOSIS — C9110 Chronic lymphocytic leukemia of B-cell type not having achieved remission: Secondary | ICD-10-CM | POA: Diagnosis not present

## 2017-10-28 LAB — CBC WITH DIFFERENTIAL (CANCER CENTER ONLY)
BASOS ABS: 0 10*3/uL (ref 0.0–0.1)
BASOS PCT: 0 %
EOS ABS: 0.2 10*3/uL (ref 0.0–0.5)
EOS PCT: 3 %
HCT: 41.5 % (ref 34.8–46.6)
Hemoglobin: 13.1 g/dL (ref 11.6–15.9)
Lymphocytes Relative: 55 %
Lymphs Abs: 4 10*3/uL — ABNORMAL HIGH (ref 0.9–3.3)
MCH: 27.6 pg (ref 26.0–34.0)
MCHC: 31.6 g/dL — ABNORMAL LOW (ref 32.0–36.0)
MCV: 87.4 fL (ref 81.0–101.0)
Monocytes Absolute: 0.4 10*3/uL (ref 0.1–0.9)
Monocytes Relative: 5 %
Neutro Abs: 2.8 10*3/uL (ref 1.5–6.5)
Neutrophils Relative %: 37 %
PLATELETS: 111 10*3/uL — AB (ref 145–400)
RBC: 4.75 MIL/uL (ref 3.70–5.32)
RDW: 14.9 % (ref 11.1–15.7)
WBC Count: 7.4 10*3/uL (ref 3.9–10.0)

## 2017-10-28 LAB — RETICULOCYTES
RBC.: 4.76 MIL/uL (ref 3.70–5.45)
Retic Count, Absolute: 66.6 10*3/uL (ref 33.7–90.7)
Retic Ct Pct: 1.4 % (ref 0.7–2.1)

## 2017-10-28 LAB — CMP (CANCER CENTER ONLY)
ALBUMIN: 4 g/dL (ref 3.5–5.0)
ALT: 14 U/L (ref 10–47)
AST: 16 U/L (ref 11–38)
Alkaline Phosphatase: 74 U/L (ref 26–84)
Anion gap: 4 — ABNORMAL LOW (ref 5–15)
BUN: 20 mg/dL (ref 7–22)
CO2: 32 mmol/L (ref 18–33)
Calcium: 10.2 mg/dL (ref 8.0–10.3)
Chloride: 103 mmol/L (ref 98–108)
Creatinine: 0.7 mg/dL (ref 0.60–1.20)
GLUCOSE: 106 mg/dL (ref 73–118)
Potassium: 4.5 mmol/L (ref 3.3–4.7)
Sodium: 139 mmol/L (ref 128–145)
Total Bilirubin: 1 mg/dL (ref 0.2–1.6)
Total Protein: 6.4 g/dL (ref 6.4–8.1)

## 2017-10-28 LAB — IRON AND TIBC
Iron: 59 ug/dL (ref 41–142)
SATURATION RATIOS: 23 % (ref 21–57)
TIBC: 259 ug/dL (ref 236–444)
UIBC: 200 ug/dL

## 2017-10-28 LAB — FERRITIN: Ferritin: 60 ng/mL (ref 11–307)

## 2017-10-28 LAB — LACTATE DEHYDROGENASE: LDH: 136 U/L (ref 98–192)

## 2017-10-28 LAB — SAVE SMEAR

## 2017-10-28 NOTE — Progress Notes (Signed)
Hematology and Oncology Follow Up Visit  [name] [id number] 1928/04/22 84 y.o. 10/28/2017   Principle Diagnosis:  Chronic lymphocytic leukemia-stage C Hypogammaglobulinemia-acquired Congestive Heart Failure Iron deficiency anemia secondary to bleeding   Current Therapy:    Imbruvica 420mg  po q day -on hold since 04/18/2017  IVIG-first dose given on 05/04/2017 - q month  IV iron with Injectafer-dose given on 05/09/2017      Interim History:  [name] is back for followup.  Everything is doing okay.  She is still recovering from her daughter's passing from uterine cancer.  I think this happened back in June.  She is still off the [place].  She is done remarkably well off Imbruvica.  She is now been off Imbruvica for 7 months.  She has had no problems with fever.  She is had no swollen lymph nodes.  She is had no change in bowel or bladder habits.  She is still constipated.  She is had no cardiac issues.  We have been watching her iron levels.  When I saw her in June, her ferritin was 59 with an iron saturation of 22%.  Her last IgG level was 945 mg/dL.  Overall, her performance status is ECOG 2.   Medications:  Current Outpatient Medications:  .  amLODipine (NORVASC) 5 MG tablet, Take 1 tablet (5 mg total) by mouth daily., Disp: 30 tablet, Rfl: 0 .  Ascorbic Acid (VITAMIN C PO), Take 1 tablet by mouth daily., Disp: , Rfl:  .  fluticasone (FLONASE) 50 MCG/ACT nasal spray, Place 1 spray into both nostrils daily., Disp: 16 g, Rfl: 0 .  furosemide (LASIX) 40 MG tablet, TAKE 1 TABLET BY MOUTH TWICE A DAY, Disp: 60 tablet, Rfl: 0 .  KLOR-CON M10 10 MEQ tablet, Take 20 mEq by mouth daily., Disp: , Rfl: 0 .  loratadine (CLARITIN) 10 MG tablet, Take 1 tablet (10 mg total) by mouth daily., Disp: 15 tablet, Rfl: 0 .  losartan (COZAAR) 50 MG tablet, Take 50 mg by mouth daily., Disp: , Rfl:  .  oxymetazoline (AFRIN) 0.05 % nasal spray, Place 2 sprays into both nostrils 2  (two) times daily. For 5 more doses and then stop, Disp: 20 mL, Rfl: 0 .  potassium chloride (K-DUR) 10 MEQ tablet, Take 2 tablets (20 mEq total) by mouth daily., Disp: 30 tablet, Rfl: 0 .  prednisoLONE acetate (PRED FORTE) 1 % ophthalmic suspension, Place 1 drop into the right eye 2 (two) times daily. , Disp: , Rfl:  .  sodium chloride (OCEAN) 0.65 % SOLN nasal spray, Place 1 spray into both nostrils as needed for congestion., Disp: 30 mL, Rfl: 0  Allergies: No Known Allergies  Past Medical History.  Review of Systems: Review of Systems  Constitutional: Positive for malaise/fatigue.  HENT: Positive for congestion and sore throat.   Eyes: Negative.   Respiratory: Positive for cough and shortness of breath.   Cardiovascular: Negative.   Gastrointestinal: Negative.   Genitourinary: Negative.   Musculoskeletal: Positive for joint pain.  Skin: Negative.   Neurological: Positive for tingling and weakness.  Endo/Heme/Allergies: Negative.   Psychiatric/Behavioral: The patient is nervous/anxious.     Physical Exam:  weight is 162 lb (73.5 kg). Her oral temperature is 97.9 F (36.6 C). Her blood pressure is 172/79 (abnormal) and her pulse is 68. Her respiration is 18 and oxygen saturation is 96%.   Physical Exam  Constitutional: She is oriented to person, place, and time.  HENT:  Head: Normocephalic  and atraumatic.  Mouth/Throat: Oropharynx is clear and moist.  Eyes: Pupils are equal, round, and reactive to light. EOM are normal.  Neck: Normal range of motion.  Cardiovascular: Normal rate, regular rhythm and normal heart sounds.  Pulmonary/Chest: Effort normal and breath sounds normal.  Abdominal: Soft. Bowel sounds are normal.  Musculoskeletal: Normal range of motion. She exhibits no edema, tenderness or deformity.  Lymphadenopathy:    She has no cervical adenopathy.  Neurological: She is alert and oriented to person, place, and time.  Skin: Skin is warm and dry. No rash noted. No  erythema.  Psychiatric: She has a normal mood and affect. Her behavior is normal. Judgment and thought content normal.  Vitals reviewed.  .  Lab Results  Component Value Date   WBC 7.4 10/28/2017   HGB 13.1 10/28/2017   HCT 41.5 10/28/2017   MCV 87.4 10/28/2017   PLT 111 (L) 10/28/2017     Chemistry      Component Value Date/Time   NA 144 09/16/2017 1005   NA 143 03/10/2017 1154   NA 140 10/15/2016 1155   K 4.2 09/16/2017 1005   K 4.5 03/10/2017 1154   K 4.1 10/15/2016 1155   CL 103 09/16/2017 1005   CL 103 03/10/2017 1154   CO2 33 09/16/2017 1005   CO2 31 03/10/2017 1154   CO2 27 10/15/2016 1155   BUN 14 09/16/2017 1005   BUN 12 03/10/2017 1154   BUN 18.6 10/15/2016 1155   CREATININE 0.50 (L) 09/16/2017 1005   CREATININE 0.7 03/10/2017 1154   CREATININE 0.7 10/15/2016 1155      Component Value Date/Time   CALCIUM 10.4 (H) 09/16/2017 1005   CALCIUM 9.9 03/10/2017 1154   CALCIUM 10.2 10/15/2016 1155   ALKPHOS 64 09/16/2017 1005   ALKPHOS 55 03/10/2017 1154   ALKPHOS 46 10/15/2016 1155   AST 17 09/16/2017 1005   AST 12 10/15/2016 1155   ALT 18 09/16/2017 1005   ALT 17 03/10/2017 1154   ALT 8 10/15/2016 1155   BILITOT 1.1 09/16/2017 1005   BILITOT 2.09 (H) 10/15/2016 1155       Impression and Plan: [name] is an 84 year old white female with CLL. She has stage C disease.   We will continue to follow her off therapy.  I am just happy that she has done well.  We will see her back in 8 weeks.  I still do not think that we have to give her IVIG.  Her IgG level is doing quite well.  I prayed for her with respect to her daughter's passing.  I know this is very hard on her.  I am just absolutely pleased that there is no evidence that her CLL is relapsing.  [name], MD 8/2/201910:24 AM

## 2017-10-29 LAB — IGG, IGA, IGM
IGA: 43 mg/dL — AB (ref 64–422)
IGG (IMMUNOGLOBIN G), SERUM: 870 mg/dL (ref 700–1600)
IgM (Immunoglobulin M), Srm: 68 mg/dL (ref 26–217)

## 2017-11-05 ENCOUNTER — Other Ambulatory Visit: Payer: Self-pay | Admitting: Hematology & Oncology

## 2017-11-10 ENCOUNTER — Emergency Department (HOSPITAL_COMMUNITY): Payer: Medicare HMO

## 2017-11-10 ENCOUNTER — Emergency Department (HOSPITAL_COMMUNITY)
Admission: EM | Admit: 2017-11-10 | Discharge: 2017-11-10 | Disposition: A | Discharge status: Home / Self Care | Payer: Medicare HMO | Attending: Emergency Medicine | Admitting: Emergency Medicine

## 2017-11-10 ENCOUNTER — Encounter (HOSPITAL_COMMUNITY): Payer: Self-pay | Admitting: Emergency Medicine

## 2017-11-10 ENCOUNTER — Other Ambulatory Visit: Payer: Self-pay

## 2017-11-10 DIAGNOSIS — R0789 Other chest pain: Secondary | ICD-10-CM | POA: Diagnosis not present

## 2017-11-10 DIAGNOSIS — I110 Hypertensive heart disease with heart failure: Secondary | ICD-10-CM | POA: Insufficient documentation

## 2017-11-10 DIAGNOSIS — Z87891 Personal history of nicotine dependence: Secondary | ICD-10-CM | POA: Diagnosis not present

## 2017-11-10 DIAGNOSIS — I4510 Unspecified right bundle-branch block: Secondary | ICD-10-CM | POA: Diagnosis not present

## 2017-11-10 DIAGNOSIS — R55 Syncope and collapse: Secondary | ICD-10-CM | POA: Diagnosis not present

## 2017-11-10 DIAGNOSIS — Z79899 Other long term (current) drug therapy: Secondary | ICD-10-CM | POA: Diagnosis not present

## 2017-11-10 DIAGNOSIS — I10 Essential (primary) hypertension: Secondary | ICD-10-CM | POA: Diagnosis not present

## 2017-11-10 DIAGNOSIS — R42 Dizziness and giddiness: Secondary | ICD-10-CM | POA: Diagnosis not present

## 2017-11-10 DIAGNOSIS — I5031 Acute diastolic (congestive) heart failure: Secondary | ICD-10-CM | POA: Diagnosis not present

## 2017-11-10 DIAGNOSIS — R0902 Hypoxemia: Secondary | ICD-10-CM | POA: Diagnosis not present

## 2017-11-10 LAB — I-STAT TROPONIN, ED: TROPONIN I, POC: 0.01 ng/mL (ref 0.00–0.08)

## 2017-11-10 LAB — CBC
HCT: 40.8 % (ref 36.0–46.0)
HEMOGLOBIN: 12.3 g/dL (ref 12.0–15.0)
MCH: 27.1 pg (ref 26.0–34.0)
MCHC: 30.1 g/dL (ref 30.0–36.0)
MCV: 89.9 fL (ref 78.0–100.0)
Platelets: 104 10*3/uL — ABNORMAL LOW (ref 150–400)
RBC: 4.54 MIL/uL (ref 3.87–5.11)
RDW: 14.6 % (ref 11.5–15.5)
WBC: 6.8 10*3/uL (ref 4.0–10.5)

## 2017-11-10 LAB — BASIC METABOLIC PANEL
ANION GAP: 6 (ref 5–15)
BUN: 14 mg/dL (ref 8–23)
CALCIUM: 9.9 mg/dL (ref 8.9–10.3)
CO2: 30 mmol/L (ref 22–32)
Chloride: 103 mmol/L (ref 98–111)
Creatinine, Ser: 0.66 mg/dL (ref 0.44–1.00)
GFR calc Af Amer: >60 mL/min (ref >60)
Glucose, Bld: 116 mg/dL — ABNORMAL HIGH (ref 70–99)
Potassium: 4.2 mmol/L (ref 3.5–5.1)
Sodium: 139 mmol/L (ref 135–145)

## 2017-11-10 LAB — ETHANOL: ALCOHOL ETHYL (B): 59 mg/dL — AB (ref <10)

## 2017-11-10 LAB — CBG MONITORING, ED: GLUCOSE-CAPILLARY: 104 mg/dL — AB (ref 70–99)

## 2017-11-10 MED ORDER — ONDANSETRON HCL 4 MG/2ML IJ SOLN
4.0000 mg | Freq: Once | INTRAMUSCULAR | Status: AC
  Administered 2017-11-10: 4 mg via INTRAVENOUS
  Filled 2017-11-10: qty 2

## 2017-11-10 NOTE — ED Provider Notes (Signed)
[facility] EMERGENCY DEPARTMENT Provider Note   CSN: [id number] Arrival date & time: 11/10/17  0204     History   Chief Complaint Chief Complaint  Patient presents with  . Chest Pain  . Loss of Consciousness    HPI [name] is a 84 y.o. female.  The history is provided by the patient and a relative.  Dizziness  Quality:  Lightheadedness Severity:  Moderate Onset quality:  Gradual Progression:  Improving Chronicity:  New Relieved by:  Nothing Worsened by:  Nothing Associated symptoms: headaches   Associated symptoms: no chest pain, no diarrhea and no vomiting   Patient with history of CLL, hypertension Presents with dizziness.  Patient reports having difficulty sleeping well because they have had multiple family members in town staying with her.  She has not been eating well, she has not been sleeping well, and she drank a small bottle of rum tonight to help her sleep.  Soon after that she became very dizzy, but there was no LOC.  She reports indigestion, but denies chest pain or shortness of breath  Past Medical History:  Diagnosis Date  . Arthritis   . CHF exacerbation ([place]) 04/22/2017  . CLL (chronic lymphocytic leukemia) ([place]) 11/10/2012  . Hypertension   . Hypogammaglobulinemia ([place]) 04/29/2017  . Knee pain, left   . Total knee replacement status     Patient Active Problem List   Diagnosis Date Noted  . Hypogammaglobulinemia ([place]) 04/29/2017  . RBBB 04/25/2017  . Nonrheumatic aortic valve stenosis   . Acute respiratory failure with hypoxia ([place]) 04/22/2017  . Chronic hypertension 04/22/2017  . Glaucoma 04/22/2017  . Acute diastolic CHF (congestive heart failure) ([place]) 04/22/2017  . Anemia 04/22/2017  . Thrombocytopenia ([place]) 04/22/2017  . Acute respiratory failure ([place]) 04/22/2017  . Chest pain 12/30/2012  . Hypertensive urgency 12/30/2012  . CLL (chronic lymphocytic leukemia) ([place]) 11/10/2012    Past Surgical History:    Procedure Laterality Date  . APPENDECTOMY    . BLEPHAROPLASTY  2003  . carpel tunnel surgery     bilateral  . CHOLECYSTECTOMY    . COLON SURGERY    . HERNIA REPAIR     left  . ORIF HIP FRACTURE Left 2003   original left hip fx and ORIF 06/2001.  s/p 10/2001 Interchange and open reduction internal fixation of hip screw.  s/p 12/2001 Hardware removal left hip.  Marland Kitchen TOTAL KNEE ARTHROPLASTY Left 2008   Dr Maureen Ralphs.       OB History   None      Home Medications    Prior to Admission medications   Medication Sig Start Date End Date Taking? Authorizing Provider  amLODipine (NORVASC) 5 MG tablet Take 1 tablet (5 mg total) by mouth daily. 12/31/12  Yes Geradine Girt, DO  Ascorbic Acid (VITAMIN C PO) Take 1 tablet by mouth daily.   Yes [provider]  fluticasone (FLONASE) 50 MCG/ACT nasal spray Place 1 spray into both nostrils daily. Patient taking differently: Place 1 spray into both nostrils daily as needed for allergies or rhinitis.  04/28/17  Yes Ghimire, Henreitta Leber, MD  losartan (COZAAR) 50 MG tablet Take 50 mg by mouth daily. 04/29/17  Yes [provider]  furosemide (LASIX) 40 MG tablet TAKE 1 TABLET BY MOUTH TWICE A DAY Patient not taking: Reported on 11/10/2017 11/07/17   [name], MD  loratadine (CLARITIN) 10 MG tablet Take 1 tablet (10 mg total) by mouth daily. Patient not  taking: Reported on 11/10/2017 04/28/17   Jonetta Osgood, MD  potassium chloride (K-DUR) 10 MEQ tablet Take 2 tablets (20 mEq total) by mouth daily. Patient not taking: Reported on 11/10/2017 04/27/17   Jonetta Osgood, MD    Family History No family history on file.  Social History Social History   Tobacco Use  . Smoking status: Former Smoker    Packs/day: 1.00    Years: 28.00    Pack years: 28.00    Types: Cigarettes    Start date: 02/15/1946    Last attempt to quit: 12/16/1973    Years since quitting: 43.9  . Smokeless tobacco: Never Used  . Tobacco comment: quit 40  years ago  Substance Use Topics  . Alcohol use: Yes    Alcohol/week: 0.0 standard drinks  . Drug use: Not on file     Allergies   Patient has no known allergies.   Review of Systems Review of Systems  Constitutional: Negative for fever.  Cardiovascular: Negative for chest pain.  Gastrointestinal: Negative for diarrhea and vomiting.  Neurological: Positive for dizziness and headaches.  All other systems reviewed and are negative.    Physical Exam Updated Vital Signs BP (!) 167/86 (BP Location: Left Arm)   Pulse 73   Temp (!) 97.5 F (36.4 C) (Oral)   Resp 16   Ht 1.499 m (4\' 11" )   Wt 74 kg   SpO2 95%   BMI 32.95 kg/m   Physical Exam CONSTITUTIONAL: Elderly, no acute distress HEAD: Normocephalic/atraumatic EYES: Blind right eye ENMT: Mucous membranes moist NECK: supple no meningeal signs SPINE/BACK: Kyphotic spine, no acute tenderness CV: O7/F6 noted, systolic ejection murmur LUNGS: Lungs are clear to auscultation bilaterally, no apparent distress ABDOMEN: soft, nontender GU:no cva tenderness NEURO: Pt is awake/alert/appropriate, moves all extremitiesx4.  No facial droop.   EXTREMITIES: pulses normal/equal, full ROM SKIN: warm, color normal PSYCH: no abnormalities of mood noted, alert and oriented to situation   ED Treatments / Results  Labs (all labs ordered are listed, but only abnormal results are displayed) Labs Reviewed  BASIC METABOLIC PANEL - Abnormal; Notable for the following components:      Result Value   Glucose, Bld 116 (*)    All other components within normal limits  CBC - Abnormal; Notable for the following components:   Platelets 104 (*)    All other components within normal limits  ETHANOL - Abnormal; Notable for the following components:   Alcohol, Ethyl (B) 59 (*)    All other components within normal limits  CBG MONITORING, ED - Abnormal; Notable for the following components:   Glucose-Capillary 104 (*)    All other components  within normal limits  I-STAT TROPONIN, ED    EKG EKG Interpretation  Date/Time:  Thursday November 10 2017 02:16:23 EDT Ventricular Rate:  65 PR Interval:    QRS Duration: 154 QT Interval:  443 QTC Calculation: 461 R Axis:   -31 Text Interpretation:  Sinus rhythm Prolonged PR interval Right bundle branch block Left ventricular hypertrophy Confirmed by Ripley Fraise [phone]) on 11/10/2017 2:35:53 AM   Radiology Dg Chest 2 View  Result Date: 11/10/2017 CLINICAL DATA:  Chest pressure and nausea after drinking tonight. EXAM: CHEST - 2 VIEW COMPARISON:  04/22/2017 FINDINGS: Cardiac enlargement. Pulmonary vascularity is normal. No airspace disease or consolidation in the lungs. No blunting of costophrenic angles. No pneumothorax. Calcification of the aorta. Degenerative changes in the spine and shoulders. Multiple anterior vertebral compression deformities likely indicating  osteoporosis. IMPRESSION: Cardiac enlargement. No evidence of active pulmonary disease. Aortic atherosclerosis. Electronically Signed   By: Lucienne Capers M.D.   On: 11/10/2017 02:37    Procedures Procedures  Medications Ordered in ED Medications  ondansetron (ZOFRAN) injection 4 mg (4 mg Intravenous Given 11/10/17 0348)     Initial Impression / Assessment and Plan / ED Course  I have reviewed the triage vital signs and the nursing notes.  Pertinent labs & imaging results that were available during my care of the patient were reviewed by me and considered in my medical decision making (see chart for details).     Strong suspicion that symptoms were related to the fact she had decreased p.o. and was also drinking rum. Is now improved, taking p.o. fluids and ambulatory.  Patient and family are requesting discharge home.  Informed family of findings on x-ray including compression fractures which are likely nonacute Final Clinical Impressions(s) / ED Diagnoses   Final diagnoses:  Dizziness    ED Discharge  Orders    None       Ripley Fraise, MD 11/10/17 [phone]

## 2017-11-10 NOTE — ED Triage Notes (Addendum)
[facility] reports the patient was with family this evening and had a rum and pepsi. PT reported not sleeping well due to family and friend staying in town and she drank the drink to get her to sleep. EMS reports that while in route the pt began to complain of chest pressure which she did not initially complain of. EMS reports vital signs are stable and the pt's room air oxygen saturation was 92% and with 2 liters by nasal cannula it was 98%. EMS reports the pt showed a right BBB on the 12 lead.   Pt reports she is just tired because of all she has going on in her life. Pt reports she thought the drink of alcohol would help her sleep. Pt is currently denying any chest pain or chest pressure. Pt states she just feels funny in her head and dizzy.

## 2017-11-10 NOTE — ED Notes (Signed)
Ambulated pt. Gave her water.

## 2017-11-10 NOTE — Discharge Instructions (Addendum)

## 2017-11-25 DIAGNOSIS — J9601 Acute respiratory failure with hypoxia: Secondary | ICD-10-CM | POA: Diagnosis not present

## 2017-11-25 DIAGNOSIS — I5031 Acute diastolic (congestive) heart failure: Secondary | ICD-10-CM | POA: Diagnosis not present

## 2017-12-26 DIAGNOSIS — J9601 Acute respiratory failure with hypoxia: Secondary | ICD-10-CM | POA: Diagnosis not present

## 2017-12-26 DIAGNOSIS — I5031 Acute diastolic (congestive) heart failure: Secondary | ICD-10-CM | POA: Diagnosis not present

## 2017-12-29 ENCOUNTER — Other Ambulatory Visit: Payer: Self-pay

## 2017-12-29 ENCOUNTER — Encounter: Payer: Self-pay | Admitting: Hematology & Oncology

## 2017-12-29 ENCOUNTER — Inpatient Hospital Stay (HOSPITAL_BASED_OUTPATIENT_CLINIC_OR_DEPARTMENT_OTHER): Payer: Medicare HMO | Admitting: Hematology & Oncology

## 2017-12-29 ENCOUNTER — Inpatient Hospital Stay: Payer: Medicare HMO | Attending: Hematology & Oncology

## 2017-12-29 VITALS — BP 173/79 | HR 72 | Temp 98.5°F | Resp 16 | Wt 158.0 lb

## 2017-12-29 DIAGNOSIS — D801 Nonfamilial hypogammaglobulinemia: Secondary | ICD-10-CM | POA: Insufficient documentation

## 2017-12-29 DIAGNOSIS — I509 Heart failure, unspecified: Secondary | ICD-10-CM | POA: Diagnosis not present

## 2017-12-29 DIAGNOSIS — D500 Iron deficiency anemia secondary to blood loss (chronic): Secondary | ICD-10-CM

## 2017-12-29 DIAGNOSIS — C9110 Chronic lymphocytic leukemia of B-cell type not having achieved remission: Secondary | ICD-10-CM

## 2017-12-29 DIAGNOSIS — Z79899 Other long term (current) drug therapy: Secondary | ICD-10-CM

## 2017-12-29 LAB — CBC WITH DIFFERENTIAL (CANCER CENTER ONLY)
BASOS ABS: 0 10*3/uL (ref 0.0–0.1)
BASOS PCT: 0 %
Eosinophils Absolute: 0.2 10*3/uL (ref 0.0–0.5)
Eosinophils Relative: 3 %
HEMATOCRIT: 41.7 % (ref 34.8–46.6)
HEMOGLOBIN: 13.1 g/dL (ref 11.6–15.9)
LYMPHS PCT: 58 %
Lymphs Abs: 4 10*3/uL — ABNORMAL HIGH (ref 0.9–3.3)
MCH: 27.4 pg (ref 26.0–34.0)
MCHC: 31.4 g/dL — ABNORMAL LOW (ref 32.0–36.0)
MCV: 87.2 fL (ref 81.0–101.0)
Monocytes Absolute: 0.3 10*3/uL (ref 0.1–0.9)
Monocytes Relative: 5 %
NEUTROS ABS: 2.4 10*3/uL (ref 1.5–6.5)
NEUTROS PCT: 34 %
Platelet Count: 104 10*3/uL — ABNORMAL LOW (ref 145–400)
RBC: 4.78 MIL/uL (ref 3.70–5.32)
RDW: 14.5 % (ref 11.1–15.7)
WBC Count: 6.9 10*3/uL (ref 3.9–10.0)

## 2017-12-29 LAB — CMP (CANCER CENTER ONLY)
ALBUMIN: 4.2 g/dL (ref 3.5–5.0)
ALK PHOS: 68 U/L (ref 26–84)
ALT: 15 U/L (ref 10–47)
AST: 18 U/L (ref 11–38)
Anion gap: 0 — ABNORMAL LOW (ref 5–15)
BILIRUBIN TOTAL: 1.1 mg/dL (ref 0.2–1.6)
BUN: 20 mg/dL (ref 7–22)
CALCIUM: 10.6 mg/dL — AB (ref 8.0–10.3)
CO2: 32 mmol/L (ref 18–33)
CREATININE: 0.9 mg/dL (ref 0.60–1.20)
Chloride: 105 mmol/L (ref 98–108)
Glucose, Bld: 108 mg/dL (ref 73–118)
Potassium: 4.7 mmol/L (ref 3.3–4.7)
Sodium: 137 mmol/L (ref 128–145)
TOTAL PROTEIN: 6.2 g/dL — AB (ref 6.4–8.1)

## 2017-12-29 LAB — LACTATE DEHYDROGENASE: LDH: 147 U/L (ref 98–192)

## 2017-12-29 LAB — SAVE SMEAR

## 2017-12-29 NOTE — Progress Notes (Signed)
Hematology and Oncology Follow Up Visit  [name] [id number] 12-21-1928 84 y.o. 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 is back for followup.  She is doing quite well.  Her husband comes with her.  Is now been about 4 months or so since her daughter passed on.  They are still recovering from this.  The whole family went down to [facility] last week.  They had a good time.  She has had no problems with infections.  There has been no problems with fever.  She has had no rashes.  She is on oxygen.  She wants to have her oxygen change to something that is more portable.  She has had no bleeding.  There has been no diarrhea.  She has had no headache.  Her blood pressure is on the high side today.  This will have to be watched closely.  Overall, her performance status is ECOG 2.   Medications:  Current Outpatient Medications:  .  amLODipine (NORVASC) 5 MG tablet, Take 1 tablet (5 mg total) by mouth daily., Disp: 30 tablet, Rfl: 0 .  Ascorbic Acid (VITAMIN C PO), Take 1 tablet by mouth daily., Disp: , Rfl:  .  fluticasone (FLONASE) 50 MCG/ACT nasal spray, Place 1 spray into both nostrils daily. (Patient taking differently: Place 1 spray into both nostrils daily as needed for allergies or rhinitis. ), Disp: 16 g, Rfl: 0 .  furosemide (LASIX) 40 MG tablet, TAKE 1 TABLET BY MOUTH TWICE A DAY (Patient not taking: Reported on 11/10/2017), Disp: 60 tablet, Rfl: 0 .  loratadine (CLARITIN) 10 MG tablet, Take 1 tablet (10 mg total) by mouth daily. (Patient not taking: Reported on 11/10/2017), Disp: 15 tablet, Rfl: 0 .  losartan (COZAAR) 50 MG  tablet, Take 50 mg by mouth daily., Disp: , Rfl:  .  potassium chloride (K-DUR) 10 MEQ tablet, Take 2 tablets (20 mEq total) by mouth daily. (Patient not taking: Reported on 11/10/2017), Disp: 30 tablet, Rfl: 0  Allergies: No Known Allergies  Past Medical History.  Review of Systems: Review of Systems  Constitutional: Positive for malaise/fatigue.  HENT: Positive for congestion and sore throat.   Eyes: Negative.   Respiratory: Positive for cough and shortness of breath.   Cardiovascular: Negative.   Gastrointestinal: Negative.   Genitourinary: Negative.   Musculoskeletal: Positive for joint pain.  Skin: Negative.   Neurological: Positive for tingling and weakness.  Endo/Heme/Allergies: Negative.   Psychiatric/Behavioral: The patient is nervous/anxious.     Physical Exam:  vitals were not taken for this visit.   Physical Exam  Constitutional: She is oriented to person, place, and time.  HENT:  Head: Normocephalic and atraumatic.  Mouth/Throat: Oropharynx is clear and moist.  Eyes: Pupils are equal, round, and reactive to light. EOM are normal.  Neck: Normal range of motion.  Cardiovascular: Normal rate, regular rhythm and normal heart sounds.  Pulmonary/Chest: Effort normal and breath sounds normal.  Abdominal: Soft. Bowel sounds are normal.  Musculoskeletal: Normal range of motion. She exhibits no edema, tenderness or deformity.  Lymphadenopathy:    She has no cervical adenopathy.  Neurological: She is alert and  oriented to person, place, and time.  Skin: Skin is warm and dry. No rash noted. No erythema.  Psychiatric: She has a normal mood and affect. Her behavior is normal. Judgment and thought content normal.  Vitals reviewed.  .  Lab Results  Component Value Date   WBC 6.9 12/29/2017   HGB 13.1 12/29/2017   HCT 41.7 12/29/2017   MCV 87.2 12/29/2017   PLT 104 (L) 12/29/2017     Chemistry      Component Value Date/Time   NA 139 11/10/2017 0229   NA 143  03/10/2017 1154   NA 140 10/15/2016 1155   K 4.2 11/10/2017 0229   K 4.5 03/10/2017 1154   K 4.1 10/15/2016 1155   CL 103 11/10/2017 0229   CL 103 03/10/2017 1154   CO2 30 11/10/2017 0229   CO2 31 03/10/2017 1154   CO2 27 10/15/2016 1155   BUN 14 11/10/2017 0229   BUN 12 03/10/2017 1154   BUN 18.6 10/15/2016 1155   CREATININE 0.66 11/10/2017 0229   CREATININE 0.70 10/28/2017 0951   CREATININE 0.7 03/10/2017 1154   CREATININE 0.7 10/15/2016 1155      Component Value Date/Time   CALCIUM 9.9 11/10/2017 0229   CALCIUM 9.9 03/10/2017 1154   CALCIUM 10.2 10/15/2016 1155   ALKPHOS 74 10/28/2017 0951   ALKPHOS 55 03/10/2017 1154   ALKPHOS 46 10/15/2016 1155   AST 16 10/28/2017 0951   AST 12 10/15/2016 1155   ALT 14 10/28/2017 0951   ALT 17 03/10/2017 1154   ALT 8 10/15/2016 1155   BILITOT 1.0 10/28/2017 0951   BILITOT 2.09 (H) 10/15/2016 1155       Impression and Plan: [name] is an 84 year old white female with CLL. She has stage C disease.   We will continue to follow her off therapy.  I am just happy that she has done well.  We will see her back in 8 weeks.  I still do not think that we have to give her IVIG.  Her IgG level is doing quite well.  I am just absolutely pleased that there is no evidence that her CLL is relapsing.  Bridget Napoleon, MD 10/3/201910:01 AM

## 2017-12-30 LAB — IGG, IGA, IGM
IGA: 43 mg/dL — AB (ref 64–422)
IGG (IMMUNOGLOBIN G), SERUM: 781 mg/dL (ref 700–1600)
IGM (IMMUNOGLOBULIN M), SRM: 63 mg/dL (ref 26–217)

## 2018-01-02 DIAGNOSIS — H401113 Primary open-angle glaucoma, right eye, severe stage: Secondary | ICD-10-CM | POA: Diagnosis not present

## 2018-01-02 DIAGNOSIS — H401121 Primary open-angle glaucoma, left eye, mild stage: Secondary | ICD-10-CM | POA: Diagnosis not present

## 2018-01-02 DIAGNOSIS — H353130 Nonexudative age-related macular degeneration, bilateral, stage unspecified: Secondary | ICD-10-CM | POA: Diagnosis not present

## 2018-01-02 DIAGNOSIS — Z961 Presence of intraocular lens: Secondary | ICD-10-CM | POA: Diagnosis not present

## 2018-01-25 DIAGNOSIS — J9601 Acute respiratory failure with hypoxia: Secondary | ICD-10-CM | POA: Diagnosis not present

## 2018-01-25 DIAGNOSIS — I5031 Acute diastolic (congestive) heart failure: Secondary | ICD-10-CM | POA: Diagnosis not present

## 2018-02-09 DIAGNOSIS — H353123 Nonexudative age-related macular degeneration, left eye, advanced atrophic without subfoveal involvement: Secondary | ICD-10-CM | POA: Diagnosis not present

## 2018-02-09 DIAGNOSIS — H353221 Exudative age-related macular degeneration, left eye, with active choroidal neovascularization: Secondary | ICD-10-CM | POA: Diagnosis not present

## 2018-02-09 DIAGNOSIS — H3562 Retinal hemorrhage, left eye: Secondary | ICD-10-CM | POA: Diagnosis not present

## 2018-02-09 DIAGNOSIS — H353212 Exudative age-related macular degeneration, right eye, with inactive choroidal neovascularization: Secondary | ICD-10-CM | POA: Diagnosis not present

## 2018-02-09 DIAGNOSIS — H353114 Nonexudative age-related macular degeneration, right eye, advanced atrophic with subfoveal involvement: Secondary | ICD-10-CM | POA: Diagnosis not present

## 2018-02-14 DIAGNOSIS — Z23 Encounter for immunization: Secondary | ICD-10-CM | POA: Diagnosis not present

## 2018-02-25 DIAGNOSIS — J9601 Acute respiratory failure with hypoxia: Secondary | ICD-10-CM | POA: Diagnosis not present

## 2018-02-25 DIAGNOSIS — I5031 Acute diastolic (congestive) heart failure: Secondary | ICD-10-CM | POA: Diagnosis not present

## 2018-03-02 ENCOUNTER — Inpatient Hospital Stay: Payer: Medicare HMO | Attending: Hematology & Oncology | Admitting: Hematology & Oncology

## 2018-03-02 ENCOUNTER — Encounter: Payer: Self-pay | Admitting: Hematology & Oncology

## 2018-03-02 ENCOUNTER — Other Ambulatory Visit: Payer: Self-pay

## 2018-03-02 ENCOUNTER — Inpatient Hospital Stay: Payer: Medicare HMO

## 2018-03-02 DIAGNOSIS — D649 Anemia, unspecified: Secondary | ICD-10-CM | POA: Diagnosis not present

## 2018-03-02 DIAGNOSIS — Z9221 Personal history of antineoplastic chemotherapy: Secondary | ICD-10-CM | POA: Diagnosis not present

## 2018-03-02 DIAGNOSIS — M25551 Pain in right hip: Secondary | ICD-10-CM

## 2018-03-02 DIAGNOSIS — D500 Iron deficiency anemia secondary to blood loss (chronic): Secondary | ICD-10-CM

## 2018-03-02 DIAGNOSIS — Z7982 Long term (current) use of aspirin: Secondary | ICD-10-CM

## 2018-03-02 DIAGNOSIS — I10 Essential (primary) hypertension: Secondary | ICD-10-CM

## 2018-03-02 DIAGNOSIS — R109 Unspecified abdominal pain: Secondary | ICD-10-CM

## 2018-03-02 DIAGNOSIS — Z90710 Acquired absence of both cervix and uterus: Secondary | ICD-10-CM

## 2018-03-02 DIAGNOSIS — N183 Chronic kidney disease, stage 3 (moderate): Secondary | ICD-10-CM | POA: Diagnosis not present

## 2018-03-02 DIAGNOSIS — M549 Dorsalgia, unspecified: Secondary | ICD-10-CM

## 2018-03-02 DIAGNOSIS — D801 Nonfamilial hypogammaglobulinemia: Secondary | ICD-10-CM

## 2018-03-02 DIAGNOSIS — C9110 Chronic lymphocytic leukemia of B-cell type not having achieved remission: Secondary | ICD-10-CM | POA: Diagnosis not present

## 2018-03-02 DIAGNOSIS — Z79899 Other long term (current) drug therapy: Secondary | ICD-10-CM

## 2018-03-02 LAB — CMP (CANCER CENTER ONLY)
ALBUMIN: 3.8 g/dL (ref 3.5–5.0)
ALK PHOS: 58 U/L (ref 38–126)
ALT: 10 U/L (ref 0–44)
ANION GAP: 1 — AB (ref 5–15)
AST: 12 U/L — ABNORMAL LOW (ref 15–41)
BILIRUBIN TOTAL: 0.7 mg/dL (ref 0.3–1.2)
BUN: 17 mg/dL (ref 8–23)
CALCIUM: 9.9 mg/dL (ref 8.9–10.3)
CO2: 33 mmol/L — AB (ref 22–32)
CREATININE: 0.71 mg/dL (ref 0.44–1.00)
Chloride: 100 mmol/L (ref 98–111)
GFR, Estimated: >60 mL/min (ref >60)
GLUCOSE: 109 mg/dL — AB (ref 70–99)
Potassium: 4.6 mmol/L (ref 3.5–5.1)
SODIUM: 134 mmol/L — AB (ref 135–145)
TOTAL PROTEIN: 5.4 g/dL — AB (ref 6.5–8.1)

## 2018-03-02 LAB — CBC WITH DIFFERENTIAL (CANCER CENTER ONLY)
Abs Immature Granulocytes: 0.02 10*3/uL (ref 0.00–0.07)
BASOS PCT: 0 %
Basophils Absolute: 0 10*3/uL (ref 0.0–0.1)
EOS ABS: 0.2 10*3/uL (ref 0.0–0.5)
EOS PCT: 2 %
HCT: 42.4 % (ref 36.0–46.0)
Hemoglobin: 12.7 g/dL (ref 12.0–15.0)
IMMATURE GRANULOCYTES: 0 %
LYMPHS ABS: 7.8 10*3/uL — AB (ref 0.7–4.0)
Lymphocytes Relative: 70 %
MCH: 27 pg (ref 26.0–34.0)
MCHC: 30 g/dL (ref 30.0–36.0)
MCV: 90.2 fL (ref 80.0–100.0)
MONOS PCT: 4 %
Monocytes Absolute: 0.5 10*3/uL (ref 0.1–1.0)
NEUTROS PCT: 24 %
Neutro Abs: 2.7 10*3/uL (ref 1.7–7.7)
Platelet Count: 142 10*3/uL — ABNORMAL LOW (ref 150–400)
RBC: 4.7 MIL/uL (ref 3.87–5.11)
RDW: 15 % (ref 11.5–15.5)
WBC Count: 11.2 10*3/uL — ABNORMAL HIGH (ref 4.0–10.5)
nRBC: 0 % (ref 0.0–0.2)

## 2018-03-02 LAB — SAVE SMEAR(SSMR), FOR PROVIDER SLIDE REVIEW

## 2018-03-02 NOTE — Progress Notes (Signed)
Hematology and Oncology Follow Up Visit  MANIE BEALER [id number] [birth year] 84 y.o. 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  Cavan is back for followup.  She is doing pretty well.  She was busy over Thanksgiving.  She still is having family come in from [place].  She has not noted any problems with swollen lymph nodes.  Her main problem has been some visual issues with her right eye.  She has glaucoma.  She is had no fever.  There is been no bleeding.  There has been no change in bowel or bladder habits.  She has constipation.  Overall, her performance status is ECOG 2.   Medications:  Current Outpatient Medications:  .  amLODipine (NORVASC) 5 MG tablet, Take 1 tablet (5 mg total) by mouth daily., Disp: 30 tablet, Rfl: 0 .  Ascorbic Acid (VITAMIN C PO), Take 1 tablet by mouth daily., Disp: , Rfl:  .  fluticasone (FLONASE) 50 MCG/ACT nasal spray, Place 1 spray into both nostrils daily. (Patient taking differently: Place 1 spray into both nostrils daily as needed for allergies or rhinitis. ), Disp: 16 g, Rfl: 0 .  furosemide (LASIX) 40 MG tablet, TAKE 1 TABLET BY MOUTH TWICE A DAY (Patient not taking: Reported on 11/10/2017), Disp: 60 tablet, Rfl: 0 .  loratadine (CLARITIN) 10 MG tablet, Take 1 tablet (10 mg total) by mouth daily. (Patient not taking: Reported on 11/10/2017), Disp: 15 tablet, Rfl: 0 .  losartan (COZAAR) 50 MG tablet, Take 50 mg by mouth daily., Disp: , Rfl:  .  potassium chloride (K-DUR) 10 MEQ tablet, Take 2 tablets (20 mEq total) by mouth daily. (Patient not taking: Reported on 11/10/2017), Disp: 30 tablet, Rfl:  0  Allergies: No Known Allergies  Past Medical History.  Review of Systems: Review of Systems  Constitutional: Positive for malaise/fatigue.  HENT: Positive for congestion and sore throat.   Eyes: Negative.   Respiratory: Positive for cough and shortness of breath.   Cardiovascular: Negative.   Gastrointestinal: Negative.   Genitourinary: Negative.   Musculoskeletal: Positive for joint pain.  Skin: Negative.   Neurological: Positive for tingling and weakness.  Endo/Heme/Allergies: Negative.   Psychiatric/Behavioral: The patient is nervous/anxious.     Physical Exam:  vitals were not taken for this visit.   Physical Exam  Constitutional: She is oriented to person, place, and time.  HENT:  Head: Normocephalic and atraumatic.  Mouth/Throat: Oropharynx is clear and moist.  Eyes: Pupils are equal, round, and reactive to light. EOM are normal.  Neck: Normal range of motion.  Cardiovascular: Normal rate, regular rhythm and normal heart sounds.  Pulmonary/Chest: Effort normal and breath sounds normal.  Abdominal: Soft. Bowel sounds are normal.  Musculoskeletal: Normal range of motion. She exhibits no edema, tenderness or deformity.  Lymphadenopathy:    She has no cervical adenopathy.  Neurological: She is alert and oriented to person, place, and time.  Skin: Skin is warm and dry. No rash noted. No erythema.  Psychiatric: She has a normal mood and affect. Her behavior is normal. Judgment and thought content normal.  Vitals reviewed.  .  Lab Results  Component Value Date  WBC 11.2 (H) 03/02/2018   HGB 12.7 03/02/2018   HCT 42.4 03/02/2018   MCV 90.2 03/02/2018   PLT 142 (L) 03/02/2018     Chemistry      Component Value Date/Time   NA 137 12/29/2017 0940   NA 143 03/10/2017 1154   NA 140 10/15/2016 1155   K 4.7 12/29/2017 0940   K 4.5 03/10/2017 1154   K 4.1 10/15/2016 1155   CL 105 12/29/2017 0940   CL 103 03/10/2017 1154   CO2 32 12/29/2017 0940   CO2 31  03/10/2017 1154   CO2 27 10/15/2016 1155   BUN 20 12/29/2017 0940   BUN 12 03/10/2017 1154   BUN 18.6 10/15/2016 1155   CREATININE 0.90 12/29/2017 0940   CREATININE 0.7 03/10/2017 1154   CREATININE 0.7 10/15/2016 1155      Component Value Date/Time   CALCIUM 10.6 (H) 12/29/2017 0940   CALCIUM 9.9 03/10/2017 1154   CALCIUM 10.2 10/15/2016 1155   ALKPHOS 68 12/29/2017 0940   ALKPHOS 55 03/10/2017 1154   ALKPHOS 46 10/15/2016 1155   AST 18 12/29/2017 0940   AST 12 10/15/2016 1155   ALT 15 12/29/2017 0940   ALT 17 03/10/2017 1154   ALT 8 10/15/2016 1155   BILITOT 1.1 12/29/2017 0940   BILITOT 2.09 (H) 10/15/2016 1155       Impression and Plan: Ms. Glaza is an 84 year old white female with CLL. She has stage C disease.   I am clearly worry now that her white cell count is starting to elevate.  I have to believe that her CLL is becoming active again.  I do not want to do anything right yet because she is asymptomatic.  I do not want any medications to be given that might affect her Christmas and new year holiday.  If we do have to restart her on therapy, I probably will use acalabrutinib.  I think that the ibrutinib was a little bit too much for her.  I would like to see her back in 6 weeks.  At that time, we will definitely know if we need to restart therapy.  I reviewed all the labs with she and her husband.  She and her husband are both aware that we may have to get her back on treatment.  [name], MD 12/5/20199:31 AM

## 2018-03-03 LAB — IGG, IGA, IGM
IgA: 36 mg/dL — ABNORMAL LOW (ref 64–422)
IgG (Immunoglobin G), Serum: 681 mg/dL — ABNORMAL LOW (ref 700–1600)
IgM (Immunoglobulin M), Srm: 53 mg/dL (ref 26–217)

## 2018-03-03 LAB — IRON AND TIBC
IRON: 55 ug/dL (ref 41–142)
SATURATION RATIOS: 22 % (ref 21–57)
TIBC: 253 ug/dL (ref 236–444)
UIBC: 198 ug/dL (ref 120–384)

## 2018-03-03 LAB — LACTATE DEHYDROGENASE: LDH: 136 U/L (ref 98–192)

## 2018-03-03 LAB — FERRITIN: FERRITIN: 26 ng/mL (ref 11–307)

## 2018-03-11 ENCOUNTER — Other Ambulatory Visit: Payer: Self-pay

## 2018-03-11 ENCOUNTER — Encounter (HOSPITAL_COMMUNITY): Payer: Self-pay | Admitting: *Deleted

## 2018-03-11 ENCOUNTER — Emergency Department (HOSPITAL_COMMUNITY): Payer: Medicare HMO

## 2018-03-11 ENCOUNTER — Emergency Department (HOSPITAL_COMMUNITY)
Admission: EM | Admit: 2018-03-11 | Discharge: 2018-03-12 | Disposition: A | Discharge status: Home / Self Care | Payer: Medicare HMO | Attending: Emergency Medicine | Admitting: Emergency Medicine

## 2018-03-11 DIAGNOSIS — R0989 Other specified symptoms and signs involving the circulatory and respiratory systems: Secondary | ICD-10-CM | POA: Diagnosis not present

## 2018-03-11 DIAGNOSIS — I5032 Chronic diastolic (congestive) heart failure: Secondary | ICD-10-CM | POA: Insufficient documentation

## 2018-03-11 DIAGNOSIS — Z87891 Personal history of nicotine dependence: Secondary | ICD-10-CM | POA: Diagnosis not present

## 2018-03-11 DIAGNOSIS — K5900 Constipation, unspecified: Secondary | ICD-10-CM

## 2018-03-11 DIAGNOSIS — I10 Essential (primary) hypertension: Secondary | ICD-10-CM | POA: Diagnosis not present

## 2018-03-11 DIAGNOSIS — Z79899 Other long term (current) drug therapy: Secondary | ICD-10-CM | POA: Insufficient documentation

## 2018-03-11 DIAGNOSIS — Z856 Personal history of leukemia: Secondary | ICD-10-CM | POA: Insufficient documentation

## 2018-03-11 DIAGNOSIS — I110 Hypertensive heart disease with heart failure: Secondary | ICD-10-CM | POA: Diagnosis not present

## 2018-03-11 DIAGNOSIS — R600 Localized edema: Secondary | ICD-10-CM

## 2018-03-11 LAB — CBC
HCT: 42 % (ref 36.0–46.0)
Hemoglobin: 12.4 g/dL (ref 12.0–15.0)
MCH: 26.8 pg (ref 26.0–34.0)
MCHC: 29.5 g/dL — ABNORMAL LOW (ref 30.0–36.0)
MCV: 90.9 fL (ref 80.0–100.0)
Platelets: 143 10*3/uL — ABNORMAL LOW (ref 150–400)
RBC: 4.62 MIL/uL (ref 3.87–5.11)
RDW: 15.2 % (ref 11.5–15.5)
WBC: 10.4 10*3/uL (ref 4.0–10.5)
nRBC: 0 % (ref 0.0–0.2)

## 2018-03-11 LAB — COMPREHENSIVE METABOLIC PANEL
ALT: 13 U/L (ref 0–44)
ANION GAP: 6 (ref 5–15)
AST: 16 U/L (ref 15–41)
Albumin: 3.8 g/dL (ref 3.5–5.0)
Alkaline Phosphatase: 61 U/L (ref 38–126)
BUN: 15 mg/dL (ref 8–23)
CO2: 31 mmol/L (ref 22–32)
Calcium: 9.6 mg/dL (ref 8.9–10.3)
Chloride: 101 mmol/L (ref 98–111)
Creatinine, Ser: 0.82 mg/dL (ref 0.44–1.00)
GFR calc Af Amer: >60 mL/min (ref >60)
GFR calc non Af Amer: >60 mL/min (ref >60)
Glucose, Bld: 171 mg/dL — ABNORMAL HIGH (ref 70–99)
Potassium: 5.1 mmol/L (ref 3.5–5.1)
Sodium: 138 mmol/L (ref 135–145)
Total Bilirubin: 0.8 mg/dL (ref 0.3–1.2)
Total Protein: 5.5 g/dL — ABNORMAL LOW (ref 6.5–8.1)

## 2018-03-11 LAB — BRAIN NATRIURETIC PEPTIDE: B Natriuretic Peptide: 50.8 pg/mL (ref 0.0–100.0)

## 2018-03-11 LAB — LIPASE, BLOOD: LIPASE: 32 U/L (ref 11–51)

## 2018-03-11 MED ORDER — FUROSEMIDE 10 MG/ML IJ SOLN
40.0000 mg | Freq: Once | INTRAMUSCULAR | Status: AC
  Administered 2018-03-11: 40 mg via INTRAVENOUS
  Filled 2018-03-11: qty 4

## 2018-03-11 NOTE — ED Triage Notes (Addendum)
Pt was been having difficulty having a bowel movement for the past 4 weeks. Has tried miralax and prune juice per her oncologist without improvement. Swelling noted to BLE; reports increased weight gain, 15 lbs in the past 4 weeks. Denies pain, c/o sob; reports "feeling like I have a corsett around my abdomen and chest"  Sees oncology for leukemia

## 2018-03-11 NOTE — ED Provider Notes (Signed)
[facility] EMERGENCY DEPARTMENT Provider Note   CSN: [id number] Arrival date & time: 03/11/18  2058     History   Chief Complaint Chief Complaint  Patient presents with  . Constipation  . Leg Swelling    HPI [name] is a 84 y.o. female.  Patient with hx CLL, chf, htn, presents c/o chronic, recurrent constipation for the past 4 weeks. States has taken meds for same, and did have some results after prune juice and miralax. Denies vomiting. No focal and/or constant abd pain, but states intermittent cramping, and feels uncomfortable at times. Is passing gas. Denies dysuria or gu c/o. No fever or chills. Pt also notes bil leg swelling. States has rx for lasix but only takes as needed, and has not had to take recently. Has home o2, but does not like to use it (dry nose). No pnd. Denies chest pain or discomfort.   The history is provided by the patient and a relative.  Constipation   Pertinent negatives include no abdominal pain and no dysuria.    Past Medical History:  Diagnosis Date  . Arthritis   . CHF exacerbation ([place]) 04/22/2017  . CLL (chronic lymphocytic leukemia) ([place]) 11/10/2012  . Hypertension   . Hypogammaglobulinemia ([place]) 04/29/2017  . Knee pain, left   . Total knee replacement status     Patient Active Problem List   Diagnosis Date Noted  . Hypogammaglobulinemia ([place]) 04/29/2017  . RBBB 04/25/2017  . Nonrheumatic aortic valve stenosis   . Acute respiratory failure with hypoxia ([place]) 04/22/2017  . Chronic hypertension 04/22/2017  . Glaucoma 04/22/2017  . Acute diastolic CHF (congestive heart failure) ([place]) 04/22/2017  . Anemia 04/22/2017  . Thrombocytopenia ([place]) 04/22/2017  . Acute respiratory failure ([place]) 04/22/2017  . Chest pain 12/30/2012  . Hypertensive urgency 12/30/2012  . CLL (chronic lymphocytic leukemia) ([place]) 11/10/2012    Past Surgical History:  Procedure Laterality Date  . APPENDECTOMY    . BLEPHAROPLASTY  2003  .  carpel tunnel surgery     bilateral  . CHOLECYSTECTOMY    . COLON SURGERY    . HERNIA REPAIR     left  . ORIF HIP FRACTURE Left 2003   original left hip fx and ORIF 06/2001.  s/p 10/2001 Interchange and open reduction internal fixation of hip screw.  s/p 12/2001 Hardware removal left hip.  Marland Kitchen TOTAL KNEE ARTHROPLASTY Left 2008   Dr Maureen Ralphs.       OB History   No obstetric history on file.      Home Medications    Prior to Admission medications   Medication Sig Start Date End Date Taking? Authorizing Provider  amLODipine (NORVASC) 5 MG tablet Take 1 tablet (5 mg total) by mouth daily. 12/31/12   Geradine Girt, DO  Ascorbic Acid (VITAMIN C PO) Take 1 tablet by mouth daily.    [provider]  fluticasone (FLONASE) 50 MCG/ACT nasal spray Place 1 spray into both nostrils daily. Patient taking differently: Place 1 spray into both nostrils daily as needed for allergies or rhinitis.  04/28/17   Ghimire, Henreitta Leber, MD  furosemide (LASIX) 40 MG tablet TAKE 1 TABLET BY MOUTH TWICE A DAY Patient not taking: Reported on 11/10/2017 11/07/17   [name], MD  loratadine (CLARITIN) 10 MG tablet Take 1 tablet (10 mg total) by mouth daily. Patient not taking: Reported on 11/10/2017 04/28/17   Jonetta Osgood, MD  losartan (COZAAR) 50 MG tablet Take 50 mg  by mouth daily. 04/29/17   [provider]  potassium chloride (K-DUR) 10 MEQ tablet Take 2 tablets (20 mEq total) by mouth daily. Patient not taking: Reported on 11/10/2017 04/27/17   Jonetta Osgood, MD    Family History No family history on file.  Social History Social History   Tobacco Use  . Smoking status: Former Smoker    Packs/day: 1.00    Years: 28.00    Pack years: 28.00    Types: Cigarettes    Start date: 02/15/1946    Last attempt to quit: 12/16/1973    Years since quitting: 44.2  . Smokeless tobacco: Never Used  . Tobacco comment: quit 40 years ago  Substance Use Topics  . Alcohol use: Yes     Alcohol/week: 0.0 standard drinks  . Drug use: Not on file     Allergies   Patient has no known allergies.   Review of Systems Review of Systems  Constitutional: Negative for fever.  HENT: Negative for sore throat.   Eyes: Negative for redness.  Respiratory: Negative for cough.   Cardiovascular: Positive for leg swelling. Negative for chest pain.  Gastrointestinal: Positive for constipation. Negative for abdominal pain and vomiting.  Endocrine: Negative for polyuria.  Genitourinary: Negative for dysuria and flank pain.  Musculoskeletal: Negative for back pain and neck pain.  Skin: Negative for rash.  Neurological: Negative for headaches.  Hematological: Does not bruise/bleed easily.  Psychiatric/Behavioral: Negative for confusion.     Physical Exam Updated Vital Signs BP (!) 135/99   Pulse 82   Temp 97.8 F (36.6 C) (Oral)   Resp 19   SpO2 99%   Physical Exam Vitals signs and nursing note reviewed.  Constitutional:      Appearance: Normal appearance. She is well-developed.  HENT:     Head: Atraumatic.     Nose: Nose normal.     Mouth/Throat:     Mouth: Mucous membranes are moist.     Pharynx: Oropharynx is clear.  Eyes:     General: No scleral icterus.    Conjunctiva/sclera: Conjunctivae normal.  Neck:     Musculoskeletal: Neck supple.     Trachea: No tracheal deviation.     Comments: No jvd.  Cardiovascular:     Rate and Rhythm: Normal rate and regular rhythm.     Pulses: Normal pulses.     Heart sounds: Normal heart sounds. No murmur. No friction rub. No gallop.   Pulmonary:     Effort: Pulmonary effort is normal. No respiratory distress.     Breath sounds: Normal breath sounds.  Abdominal:     General: Abdomen is flat. Bowel sounds are normal. There is no distension.     Palpations: Abdomen is soft. There is no mass.     Tenderness: There is no abdominal tenderness.     Hernia: No hernia is present.  Genitourinary:    Comments: No cva tenderness.    Musculoskeletal:     Comments: Moderate bilateral foot/ankle/lower leg edema, symmetric. No calf pain/tenderness.   Skin:    General: Skin is warm and dry.     Findings: No rash.  Neurological:     Mental Status: She is alert.     Comments: Alert, speech clear/fluent. Motor/sens grossly intact.   Psychiatric:        Mood and Affect: Mood normal.      ED Treatments / Results  Labs (all labs ordered are listed, but only abnormal results are displayed) Results for orders  placed or performed during the hospital encounter of 03/11/18  Lipase, blood  Result Value Ref Range   Lipase 32 11 - 51 U/L  Comprehensive metabolic panel  Result Value Ref Range   Sodium 138 135 - 145 mmol/L   Potassium 5.1 3.5 - 5.1 mmol/L   Chloride 101 98 - 111 mmol/L   CO2 31 22 - 32 mmol/L   Glucose, Bld 171 (H) 70 - 99 mg/dL   BUN 15 8 - 23 mg/dL   Creatinine, Ser 0.82 0.44 - 1.00 mg/dL   Calcium 9.6 8.9 - 10.3 mg/dL   Total Protein 5.5 (L) 6.5 - 8.1 g/dL   Albumin 3.8 3.5 - 5.0 g/dL   AST 16 15 - 41 U/L   ALT 13 0 - 44 U/L   Alkaline Phosphatase 61 38 - 126 U/L   Total Bilirubin 0.8 0.3 - 1.2 mg/dL   GFR calc non Af Amer >60 >60 mL/min   GFR calc Af Amer >60 >60 mL/min   Anion gap 6 5 - 15  CBC  Result Value Ref Range   WBC 10.4 4.0 - 10.5 K/uL   RBC 4.62 3.87 - 5.11 MIL/uL   Hemoglobin 12.4 12.0 - 15.0 g/dL   HCT 42.0 36.0 - 46.0 %   MCV 90.9 80.0 - 100.0 fL   MCH 26.8 26.0 - 34.0 pg   MCHC 29.5 (L) 30.0 - 36.0 g/dL   RDW 15.2 11.5 - 15.5 %   Platelets 143 (L) 150 - 400 K/uL   nRBC 0.0 0.0 - 0.2 %  Brain natriuretic peptide  Result Value Ref Range   B Natriuretic Peptide 50.8 0.0 - 100.0 pg/mL   Dg Abd Acute W/chest  Result Date: 03/11/2018 CLINICAL DATA:  Abdominal distension and constipation for 1 month. Weight gain and lower extremity swelling. History of leukemia. EXAM: DG ABDOMEN ACUTE W/ 1V CHEST COMPARISON:  Chest radiograph November 10, 2017 FINDINGS: Cardiac silhouette is  severely enlarged and unchanged. Calcified aortic arch. Mild pulmonary vascular congestion. Small pleural effusions. No pneumothorax. Osteopenia. Stable degenerative changes shoulders. Scoliosis. Bowel gas pattern is nondilated and nonobstructive. Surgical clips in the abdomen and pelvis in addition to RIGHT bowel anastomotic staples. No radiographic findings of significant retained large bowel stool. Aortoiliac calcifications. Levoscoliosis. Osteopenia. IMPRESSION: 1. Stable cardiomegaly, mild pulmonary vascular congestion. 2. Normal bowel gas pattern, no radiographic findings of significant retained large bowel stool. 3.  Aortic Atherosclerosis (ICD10-I70.0). Electronically Signed   By: Elon Alas M.D.   On: 03/11/2018 22:27    EKG EKG Interpretation  Date/Time:  Saturday March 11 2018 21:37:04 EST Ventricular Rate:  85 PR Interval:    QRS Duration: 136 QT Interval:  381 QTC Calculation: 453 R Axis:   32 Text Interpretation:  Sinus rhythm Right bundle branch block No significant change since last tracing Confirmed by Lajean Saver [phone]) on 03/11/2018 9:41:29 PM   Radiology Dg Abd Acute W/chest  Result Date: 03/11/2018 CLINICAL DATA:  Abdominal distension and constipation for 1 month. Weight gain and lower extremity swelling. History of leukemia. EXAM: DG ABDOMEN ACUTE W/ 1V CHEST COMPARISON:  Chest radiograph November 10, 2017 FINDINGS: Cardiac silhouette is severely enlarged and unchanged. Calcified aortic arch. Mild pulmonary vascular congestion. Small pleural effusions. No pneumothorax. Osteopenia. Stable degenerative changes shoulders. Scoliosis. Bowel gas pattern is nondilated and nonobstructive. Surgical clips in the abdomen and pelvis in addition to RIGHT bowel anastomotic staples. No radiographic findings of significant retained large bowel stool. Aortoiliac calcifications. Levoscoliosis. Osteopenia.  IMPRESSION: 1. Stable cardiomegaly, mild pulmonary vascular congestion. 2.  Normal bowel gas pattern, no radiographic findings of significant retained large bowel stool. 3.  Aortic Atherosclerosis (ICD10-I70.0). Electronically Signed   By: Elon Alas M.D.   On: 03/11/2018 22:27    Procedures Procedures (including critical care time)  Medications Ordered in ED Medications  furosemide (LASIX) injection 40 mg (has no administration in time range)     Initial Impression / Assessment and Plan / ED Course  I have reviewed the triage vital signs and the nursing notes.  Pertinent labs & imaging results that were available during my care of the patient were reviewed by me and considered in my medical decision making (see chart for details).  Labs sent. Imaging ordered.  cxr reviewed - ?mild vasc congestion - pt given dose of her lasix, iv.   Legs elevated.   On abd films, no sbo. No rectal exam, minimal soft light brown stool. No mass felt.   abd soft nt.   No increased wob. Pulse ox 96%.  No chest pain or discomfort.   Pt currently appears stable for d/c.  Discussed trial colace and miralax for constipation. Elevate legs. Low salt diet.   rec close pcp f/u.   Return precautions provided.     Final Clinical Impressions(s) / ED Diagnoses   Final diagnoses:  None    ED Discharge Orders    None       Lajean Saver, MD 03/11/18 2256

## 2018-03-11 NOTE — Discharge Instructions (Addendum)
It was our pleasure to provide your ER care today - we hope that you feel better.  For constipation, drink plenty of fluids, and get adequate fiber in diet. Take colace (stool softener) 2x/day, and miralax (laxative) one time per day as need - both of these medications are available over the counter (I.e. [facility], [facility], etc).   For leg swelling, we did give you a dose of diuretic/fluid medication in the ER. Elevate legs. Consider use of compression stockings. Limit salt intake in diet. Follow up with primary care doctor in the coming week.   Follow up with your doctor this week - call office Monday to arrange appointment.  Return to ER if worse, new symptoms, fevers, persistent vomiting, severe abdominal pain, increased trouble breathing, other concern.

## 2018-03-13 DIAGNOSIS — H353221 Exudative age-related macular degeneration, left eye, with active choroidal neovascularization: Secondary | ICD-10-CM | POA: Diagnosis not present

## 2018-03-13 DIAGNOSIS — H3562 Retinal hemorrhage, left eye: Secondary | ICD-10-CM | POA: Diagnosis not present

## 2018-03-13 DIAGNOSIS — H353212 Exudative age-related macular degeneration, right eye, with inactive choroidal neovascularization: Secondary | ICD-10-CM | POA: Diagnosis not present

## 2018-03-13 DIAGNOSIS — H353114 Nonexudative age-related macular degeneration, right eye, advanced atrophic with subfoveal involvement: Secondary | ICD-10-CM | POA: Diagnosis not present

## 2018-03-15 DIAGNOSIS — R609 Edema, unspecified: Secondary | ICD-10-CM | POA: Diagnosis not present

## 2018-03-15 DIAGNOSIS — K5901 Slow transit constipation: Secondary | ICD-10-CM | POA: Diagnosis not present

## 2018-03-15 DIAGNOSIS — R0982 Postnasal drip: Secondary | ICD-10-CM | POA: Diagnosis not present

## 2018-03-20 DIAGNOSIS — R600 Localized edema: Secondary | ICD-10-CM | POA: Diagnosis not present

## 2018-03-20 DIAGNOSIS — I10 Essential (primary) hypertension: Secondary | ICD-10-CM | POA: Diagnosis not present

## 2018-03-20 DIAGNOSIS — K219 Gastro-esophageal reflux disease without esophagitis: Secondary | ICD-10-CM | POA: Diagnosis not present

## 2018-03-27 DIAGNOSIS — J9601 Acute respiratory failure with hypoxia: Secondary | ICD-10-CM | POA: Diagnosis not present

## 2018-03-27 DIAGNOSIS — I5031 Acute diastolic (congestive) heart failure: Secondary | ICD-10-CM | POA: Diagnosis not present

## 2018-04-04 ENCOUNTER — Other Ambulatory Visit: Payer: Self-pay

## 2018-04-04 ENCOUNTER — Emergency Department (HOSPITAL_COMMUNITY): Payer: Medicare HMO

## 2018-04-04 ENCOUNTER — Inpatient Hospital Stay (HOSPITAL_COMMUNITY)
Admission: EM | Admit: 2018-04-04 | Discharge: 2018-04-29 | DRG: 291 | Disposition: E | Payer: Medicare HMO | Attending: Pulmonary Disease | Admitting: Pulmonary Disease

## 2018-04-04 ENCOUNTER — Telehealth: Payer: Self-pay | Admitting: *Deleted

## 2018-04-04 ENCOUNTER — Encounter (HOSPITAL_COMMUNITY): Payer: Self-pay

## 2018-04-04 DIAGNOSIS — I110 Hypertensive heart disease with heart failure: Principal | ICD-10-CM | POA: Diagnosis present

## 2018-04-04 DIAGNOSIS — J9602 Acute respiratory failure with hypercapnia: Secondary | ICD-10-CM | POA: Diagnosis not present

## 2018-04-04 DIAGNOSIS — Z9981 Dependence on supplemental oxygen: Secondary | ICD-10-CM

## 2018-04-04 DIAGNOSIS — J9601 Acute respiratory failure with hypoxia: Secondary | ICD-10-CM | POA: Diagnosis present

## 2018-04-04 DIAGNOSIS — Z9049 Acquired absence of other specified parts of digestive tract: Secondary | ICD-10-CM

## 2018-04-04 DIAGNOSIS — E669 Obesity, unspecified: Secondary | ICD-10-CM | POA: Diagnosis present

## 2018-04-04 DIAGNOSIS — Z8049 Family history of malignant neoplasm of other genital organs: Secondary | ICD-10-CM

## 2018-04-04 DIAGNOSIS — J90 Pleural effusion, not elsewhere classified: Secondary | ICD-10-CM | POA: Diagnosis not present

## 2018-04-04 DIAGNOSIS — Z6836 Body mass index (BMI) 36.0-36.9, adult: Secondary | ICD-10-CM | POA: Diagnosis not present

## 2018-04-04 DIAGNOSIS — C9110 Chronic lymphocytic leukemia of B-cell type not having achieved remission: Secondary | ICD-10-CM | POA: Diagnosis present

## 2018-04-04 DIAGNOSIS — D801 Nonfamilial hypogammaglobulinemia: Secondary | ICD-10-CM | POA: Diagnosis present

## 2018-04-04 DIAGNOSIS — E875 Hyperkalemia: Secondary | ICD-10-CM | POA: Diagnosis present

## 2018-04-04 DIAGNOSIS — Z7951 Long term (current) use of inhaled steroids: Secondary | ICD-10-CM

## 2018-04-04 DIAGNOSIS — I4510 Unspecified right bundle-branch block: Secondary | ICD-10-CM | POA: Diagnosis present

## 2018-04-04 DIAGNOSIS — H409 Unspecified glaucoma: Secondary | ICD-10-CM | POA: Diagnosis present

## 2018-04-04 DIAGNOSIS — Z515 Encounter for palliative care: Secondary | ICD-10-CM | POA: Diagnosis not present

## 2018-04-04 DIAGNOSIS — R0902 Hypoxemia: Secondary | ICD-10-CM | POA: Diagnosis not present

## 2018-04-04 DIAGNOSIS — Z4659 Encounter for fitting and adjustment of other gastrointestinal appliance and device: Secondary | ICD-10-CM

## 2018-04-04 DIAGNOSIS — I313 Pericardial effusion (noninflammatory): Secondary | ICD-10-CM | POA: Diagnosis not present

## 2018-04-04 DIAGNOSIS — I959 Hypotension, unspecified: Secondary | ICD-10-CM | POA: Diagnosis not present

## 2018-04-04 DIAGNOSIS — I509 Heart failure, unspecified: Secondary | ICD-10-CM

## 2018-04-04 DIAGNOSIS — I5033 Acute on chronic diastolic (congestive) heart failure: Secondary | ICD-10-CM | POA: Diagnosis present

## 2018-04-04 DIAGNOSIS — I3139 Other pericardial effusion (noninflammatory): Secondary | ICD-10-CM

## 2018-04-04 DIAGNOSIS — I10 Essential (primary) hypertension: Secondary | ICD-10-CM | POA: Diagnosis not present

## 2018-04-04 DIAGNOSIS — Z978 Presence of other specified devices: Secondary | ICD-10-CM

## 2018-04-04 DIAGNOSIS — M1990 Unspecified osteoarthritis, unspecified site: Secondary | ICD-10-CM | POA: Diagnosis present

## 2018-04-04 DIAGNOSIS — E872 Acidosis: Secondary | ICD-10-CM | POA: Diagnosis not present

## 2018-04-04 DIAGNOSIS — Z7189 Other specified counseling: Secondary | ICD-10-CM | POA: Diagnosis not present

## 2018-04-04 DIAGNOSIS — C9111 Chronic lymphocytic leukemia of B-cell type in remission: Secondary | ICD-10-CM | POA: Diagnosis present

## 2018-04-04 DIAGNOSIS — Z4682 Encounter for fitting and adjustment of non-vascular catheter: Secondary | ICD-10-CM | POA: Diagnosis not present

## 2018-04-04 DIAGNOSIS — R0609 Other forms of dyspnea: Secondary | ICD-10-CM | POA: Diagnosis not present

## 2018-04-04 DIAGNOSIS — Z87891 Personal history of nicotine dependence: Secondary | ICD-10-CM

## 2018-04-04 DIAGNOSIS — G92 Toxic encephalopathy: Secondary | ICD-10-CM | POA: Diagnosis not present

## 2018-04-04 DIAGNOSIS — J9811 Atelectasis: Secondary | ICD-10-CM | POA: Diagnosis not present

## 2018-04-04 DIAGNOSIS — I2510 Atherosclerotic heart disease of native coronary artery without angina pectoris: Secondary | ICD-10-CM | POA: Diagnosis not present

## 2018-04-04 DIAGNOSIS — Z96652 Presence of left artificial knee joint: Secondary | ICD-10-CM | POA: Diagnosis present

## 2018-04-04 DIAGNOSIS — I350 Nonrheumatic aortic (valve) stenosis: Secondary | ICD-10-CM | POA: Diagnosis present

## 2018-04-04 DIAGNOSIS — J9600 Acute respiratory failure, unspecified whether with hypoxia or hypercapnia: Secondary | ICD-10-CM | POA: Diagnosis not present

## 2018-04-04 DIAGNOSIS — D500 Iron deficiency anemia secondary to blood loss (chronic): Secondary | ICD-10-CM | POA: Diagnosis not present

## 2018-04-04 DIAGNOSIS — Z66 Do not resuscitate: Secondary | ICD-10-CM | POA: Diagnosis present

## 2018-04-04 DIAGNOSIS — Z452 Encounter for adjustment and management of vascular access device: Secondary | ICD-10-CM

## 2018-04-04 DIAGNOSIS — R531 Weakness: Secondary | ICD-10-CM

## 2018-04-04 DIAGNOSIS — R0602 Shortness of breath: Secondary | ICD-10-CM | POA: Diagnosis not present

## 2018-04-04 DIAGNOSIS — Z85038 Personal history of other malignant neoplasm of large intestine: Secondary | ICD-10-CM

## 2018-04-04 DIAGNOSIS — D649 Anemia, unspecified: Secondary | ICD-10-CM | POA: Diagnosis present

## 2018-04-04 LAB — COMPREHENSIVE METABOLIC PANEL
ALT: 21 U/L (ref 0–44)
AST: 21 U/L (ref 15–41)
Albumin: 4.4 g/dL (ref 3.5–5.0)
Alkaline Phosphatase: 57 U/L (ref 38–126)
Anion gap: 7 (ref 5–15)
BUN: 33 mg/dL — ABNORMAL HIGH (ref 8–23)
CHLORIDE: 100 mmol/L (ref 98–111)
CO2: 31 mmol/L (ref 22–32)
Calcium: 9.9 mg/dL (ref 8.9–10.3)
Creatinine, Ser: 1.01 mg/dL — ABNORMAL HIGH (ref 0.44–1.00)
GFR calc Af Amer: 58 mL/min — ABNORMAL LOW (ref >60)
GFR calc non Af Amer: 50 mL/min — ABNORMAL LOW (ref >60)
Glucose, Bld: 123 mg/dL — ABNORMAL HIGH (ref 70–99)
Potassium: 5.2 mmol/L — ABNORMAL HIGH (ref 3.5–5.1)
Sodium: 138 mmol/L (ref 135–145)
Total Bilirubin: 0.8 mg/dL (ref 0.3–1.2)
Total Protein: 6.6 g/dL (ref 6.5–8.1)

## 2018-04-04 LAB — CBC WITH DIFFERENTIAL/PLATELET
Abs Immature Granulocytes: 0.06 10*3/uL (ref 0.00–0.07)
Basophils Absolute: 0 10*3/uL (ref 0.0–0.1)
Basophils Relative: 0 %
Eosinophils Absolute: 0 10*3/uL (ref 0.0–0.5)
Eosinophils Relative: 0 %
HCT: 52 % — ABNORMAL HIGH (ref 36.0–46.0)
Hemoglobin: 14.8 g/dL (ref 12.0–15.0)
Immature Granulocytes: 0 %
Lymphocytes Relative: 65 %
Lymphs Abs: 10 10*3/uL — ABNORMAL HIGH (ref 0.7–4.0)
MCH: 27.1 pg (ref 26.0–34.0)
MCHC: 28.5 g/dL — ABNORMAL LOW (ref 30.0–36.0)
MCV: 95.1 fL (ref 80.0–100.0)
Monocytes Absolute: 0.9 10*3/uL (ref 0.1–1.0)
Monocytes Relative: 6 %
Neutro Abs: 4.5 10*3/uL (ref 1.7–7.7)
Neutrophils Relative %: 29 %
Platelets: 152 10*3/uL (ref 150–400)
RBC: 5.47 MIL/uL — ABNORMAL HIGH (ref 3.87–5.11)
RDW: 14.6 % (ref 11.5–15.5)
WBC: 15.5 10*3/uL — AB (ref 4.0–10.5)
nRBC: 0 % (ref 0.0–0.2)

## 2018-04-04 LAB — I-STAT TROPONIN, ED: Troponin i, poc: 0.01 ng/mL (ref 0.00–0.08)

## 2018-04-04 LAB — TSH: TSH: 2.707 u[IU]/mL (ref 0.350–4.500)

## 2018-04-04 LAB — BRAIN NATRIURETIC PEPTIDE: B Natriuretic Peptide: 110.5 pg/mL — ABNORMAL HIGH (ref 0.0–100.0)

## 2018-04-04 MED ORDER — FUROSEMIDE 10 MG/ML IJ SOLN
80.0000 mg | Freq: Two times a day (BID) | INTRAMUSCULAR | Status: DC
  Administered 2018-04-04 – 2018-04-05 (×2): 80 mg via INTRAVENOUS
  Filled 2018-04-04 (×2): qty 8

## 2018-04-04 MED ORDER — ACETAMINOPHEN 650 MG RE SUPP: 650.0000 mg | Freq: Four times a day (QID) | RECTAL | Status: DC | PRN

## 2018-04-04 MED ORDER — LATANOPROST 0.005 % OP SOLN
1.0000 [drp] | Freq: Every day | OPHTHALMIC | Status: DC
  Administered 2018-04-04 – 2018-04-05 (×2): 1 [drp] via OPHTHALMIC
  Filled 2018-04-04 (×2): qty 2.5

## 2018-04-04 MED ORDER — ENOXAPARIN SODIUM 40 MG/0.4ML ~~LOC~~ SOLN
40.0000 mg | SUBCUTANEOUS | Status: DC
  Administered 2018-04-04 – 2018-04-05 (×2): 40 mg via SUBCUTANEOUS
  Filled 2018-04-04 (×2): qty 0.4

## 2018-04-04 MED ORDER — LOSARTAN POTASSIUM 50 MG PO TABS
50.0000 mg | ORAL_TABLET | Freq: Every day | ORAL | Status: DC
  Administered 2018-04-04 – 2018-04-05 (×2): 50 mg via ORAL
  Filled 2018-04-04 (×2): qty 1

## 2018-04-04 MED ORDER — FUROSEMIDE 10 MG/ML IJ SOLN
40.0000 mg | Freq: Once | INTRAMUSCULAR | Status: AC
  Administered 2018-04-04: 40 mg via INTRAVENOUS
  Filled 2018-04-04: qty 4

## 2018-04-04 MED ORDER — ACETAMINOPHEN 325 MG PO TABS
650.0000 mg | ORAL_TABLET | Freq: Four times a day (QID) | ORAL | Status: DC | PRN
  Administered 2018-04-05: 650 mg via ORAL
  Filled 2018-04-04: qty 2

## 2018-04-04 MED ORDER — ONDANSETRON HCL 4 MG/2ML IJ SOLN: 4.0000 mg | Freq: Four times a day (QID) | INTRAMUSCULAR | Status: DC | PRN

## 2018-04-04 MED ORDER — AMLODIPINE BESYLATE 5 MG PO TABS
5.0000 mg | ORAL_TABLET | Freq: Every day | ORAL | Status: DC
  Administered 2018-04-04 – 2018-04-05 (×2): 5 mg via ORAL
  Filled 2018-04-04 (×2): qty 1

## 2018-04-04 MED ORDER — ONDANSETRON HCL 4 MG PO TABS: 4.0000 mg | ORAL_TABLET | Freq: Four times a day (QID) | ORAL | Status: DC | PRN

## 2018-04-04 NOTE — H&P (Signed)
History and Physical  Patient Name: [name]     QIW:[id number]    DOB: 1928-07-24    DOA: 04/26/2018 PCP: Shirline Frees, MD   Patient coming from: Home  Chief Complaint: Dyspnea, weakness  HPI: [name] is a 85 y.o. F with CLL, dCHF on intermittent home O2, and HTN who presents with 2 weeks progressive dyspnea on exertion and leg swelling.  Caveat that history is collected primarily from the daughter at the bedside as the patient is very sluggish.  Evidently the patient was in her usual state of health until about 2 weeks ago, she started to develop new dyspnea on exertion and leg swelling.  She saw her PCP who increased her Lasix.  The leg swelling improved, but the patient seemed to be more tired in the past few days, so daughter stopped the Lasix, thinking the tiredness was dehydration.  Now in the last few days, the patient is unable to breathe when she lays flat, waking up frequently at night to sit up to catch her breath, and today was hypoxic on home oximeter and appeared to have labored breathing so the daughter brought her to the ER.  ED course: - Afebrile, heart rate 81, respirations 26, pulse ox 94% on 6 L, blood pressure 120/96 -Na 138, K 5.2, Cr 1.0, WBC 15.5K, Hgb 14.8 - BNP 110 - Troponin negative -Chest x-ray showed bilateral effusions and pulmonary edema -ECG showed rate 84, old RBBB -He was given 40 mg IV Lasix with poor urine return -The hospital service were asked to evaluate for congestive heart failure     Review of Systems:  Review of Systems  Constitutional: Positive for malaise/fatigue. Negative for chills and fever.  Respiratory: Positive for shortness of breath. Negative for cough, hemoptysis, sputum production and wheezing.   Cardiovascular: Positive for orthopnea, leg swelling and PND. Negative for chest pain and palpitations.  Neurological: Positive for weakness.  All other systems reviewed and are negative.       Past Medical  History:  Diagnosis Date  . Arthritis   . CHF exacerbation ([place]) 04/22/2017  . CLL (chronic lymphocytic leukemia) ([place]) 11/10/2012  . Hypertension   . Hypogammaglobulinemia ([place]) 04/29/2017  . Knee pain, left   . Total knee replacement status     Past Surgical History:  Procedure Laterality Date  . APPENDECTOMY    . BLEPHAROPLASTY  2003  . carpel tunnel surgery     bilateral  . CHOLECYSTECTOMY    . COLON SURGERY    . HERNIA REPAIR     left  . ORIF HIP FRACTURE Left 2003   original left hip fx and ORIF 06/2001.  s/p 10/2001 Interchange and open reduction internal fixation of hip screw.  s/p 12/2001 Hardware removal left hip.  Marland Kitchen TOTAL KNEE ARTHROPLASTY Left 2008   Dr Maureen Ralphs.      Social History: Patient lives with her daughter.  Patient walks with a walker.   reports that she quit smoking about 44 years ago. Her smoking use included cigarettes. She started smoking about 72 years ago. She has a 28.00 pack-year smoking history. She has never used smokeless tobacco. She reports current alcohol use. No history on file for drug.  No Known Allergies  Family history: No history of CHF.  Prior to Admission medications   Medication Sig Start Date End Date Taking? Authorizing Provider  amLODipine (NORVASC) 5 MG tablet Take 1 tablet (5 mg total) by mouth daily. 12/31/12  Yes Eulogio Bear  U, DO  fluticasone (FLONASE) 50 MCG/ACT nasal spray Place 1 spray into both nostrils daily. Patient taking differently: Place 1 spray into both nostrils daily as needed for allergies or rhinitis.  04/28/17  Yes Ghimire, Henreitta Leber, MD  latanoprost (XALATAN) 0.005 % ophthalmic solution Place 1 drop into the right eye at bedtime.   Yes [provider]  losartan (COZAAR) 50 MG tablet Take 50 mg by mouth daily. 04/29/17  Yes [provider]  vitamin C (ASCORBIC ACID) 500 MG tablet Take 1,000 mg by mouth daily.   Yes [provider]       Physical Exam: BP 132/76 (BP Location: Right Arm)    Pulse 82   Temp 99.2 F (37.3 C) (Oral)   Resp 18   Wt 81.6 kg   SpO2 94%   BMI 36.36 kg/m  General appearance: Well-developed, obese adult female, sluggish but awake, in acute respiratory distress.   Eyes: Anicteric, conjunctiva pink, lids are a little red, right more than left, left pupil reactive, left not ENT: No nasal deformity, discharge, epistaxis.  Hearing diminished. OP tacky dry without lesions.   Dentures in place, lips normal. Neck: No neck masses.  Trachea midline.  No thyromegaly/tenderness. Lymph: No cervical or supraclavicular lymphadenopathy. Skin: Warm and dry.  No jaundice.  No suspicious rashes or lesions. Cardiac: Tachycardic, nl L2-G4, systolic murmur.  Capillary refill is brisk.  JVP elevated.  1+ no LE edema.  Radial pulses 2+ and symmetric. Respiratory: Tachypneic.  Rales at the bases.  No wheezing.    Abdomen: Abdomen soft.  No TTP, or guarding. No ascites, distension, hepatosplenomegaly.   MSK: No deformities or effusions.  No cyanosis or clubbing. Neuro: Sensation intact to light touch. Speech is fluent.  Muscle strength 4/5 but symmetric.    Psych: Sensorium intact and responding to questions, but attention diminished.  Affect blunted.  Judgment and insight appear normal.    Labs on Admission:  I have personally reviewed the following studies: The metabolic panel shows mild hypokalemia, elevated BUN to creatinine ratio, normal sodium and creatinine. The complete blood count shows leukocytosis, no anemia or thrombocytopenia. Transaminases normal. BNP slightly elevated Troponin normal   Radiological Exams on Admission: Personally reviewed CXR shows effusions and pleural edema: Dg Chest Port 1 View  Result Date: 04/20/2018 CLINICAL DATA:  Shortness of breath.  Weakness. EXAM: PORTABLE CHEST 1 VIEW COMPARISON:  03/11/2018. FINDINGS: Cardiomegaly with pulmonary venous congestion and severe bilateral interstitial prominence. Moderate bilateral pleural  effusions. Findings consistent with CHF. IMPRESSION: Congestive heart failure with prominent interstitial pulmonary edema. Mild bilateral pleural effusions. Electronically Signed   By: Marcello Moores  Register   On: 04/27/2018 14:02    EKG: Independently reviewed.  Rate 84, RBBB, no ST changes.  Echocardiogram February 2019: Report reviewed, EF 55 to 01%, grade 1 diastolic dysfunction, moderate aortic stenosis.    Assessment/Plan  Acute on chronic diastolic CHF Presents with DOE, leg swelling, edema and effusions on CXR, elevated BNP, orthopnea and PND.  EF normal.  Poor response to 40 mg IV in ER -Furosemide 80 mg IV twice a day  -Hold K supplement for now -Strict I/Os, daily weights, telemetry  -Daily monitoring renal function     Hypertension - Continue home amlodipine, losartan  Hyperkalemia Mild -Lasix as above  Leukocytosis No fever productive cough to suggest pneumonia.  Doubt infection -If spikes fever, blood cultures, start antibiotics, check procalcitonin  Other medications -Continue eyedrops         DVT  prophylaxis: Lovenox  Code Status: DO NOT RESUSCITATE  Family Communication: [place] and husband at bedside  Disposition Plan: Anticipate IV diuresis, given degree of hypoxia, likely for 2-3 days at [employer] called: None Admission status: INPATIENT    At the time of admission, it appears that the appropriate admission status for this patient is INPATIENT. This is judged to be reasonable and necessary in order to provide the required intensity of service to ensure the patient's safety given: -presenting symptoms of dyspnea, orthopnea -physical exam findings of tachypnea >26 per minute, hypoxia, and  -initial radiographic and laboratory data edema and pleural effusion on CXR -in the context of their chronic comorbidities chronic diastolic CHF, and CLL    Together, these circumstances are felt to place her at high risk for further clinical  deterioration threatening life, limb, or organ requiring a high intensity of service due to this severe exacerbation of their chronic organ failure  I certify that at the point of admission it is my clinical judgment that the patient will require inpatient hospital care spanning beyond 2 midnights from the point of admission and that early discharge would result in unnecessary risk of decompensation and readmission or threat to life, limb or bodily function.    Medical decision making: Patient seen at 3:15 PM on 03/29/2018.  The patient was discussed with Dr. Maryan Rued.  What exists of the patient's chart was reviewed in depth and summarized above.  An extensive number of diagnoses or treatment options were managed. An extensive complexity of data was involved in decision making.Clinical condition: respiratory status requiring 6L to stabilize, but BP and HR normal, mentation slightly diminshed.            Edwin Dada [facility] Pager [phone]

## 2018-04-04 NOTE — ED Triage Notes (Signed)
Pt arrives from home via [facility]. Per EMS: Pt reports SOB and weakness for 3-4 days. Pt uses 2L o2 via [place] at home PRN. Per EMS: Pt was 86% on 2L upon arrival. Pt now 94% on 4-6L.

## 2018-04-04 NOTE — ED Notes (Signed)
ED Provider at bedside. 

## 2018-04-04 NOTE — ED Notes (Signed)
XR at bedside

## 2018-04-04 NOTE — ED Notes (Signed)
ED TO INPATIENT HANDOFF REPORT  Name/Age/Gender [name] 85 y.o. female  Code Status    Code Status Orders  (From admission, onward)         Start     Ordered   03/31/2018 1539  Do not attempt resuscitation (DNR)  Continuous    Question Answer Comment  In the event of cardiac or respiratory ARREST Do not call a "code blue"   In the event of cardiac or respiratory ARREST Do not perform Intubation, CPR, defibrillation or ACLS   In the event of cardiac or respiratory ARREST Use medication by any route, position, wound care, and other measures to relive pain and suffering. May use oxygen, suction and manual treatment of airway obstruction as needed for comfort.      04/02/2018 1538        Code Status History    Date Active Date Inactive Code Status Order ID Comments User Context   04/23/2017 0014 04/27/2017 1810 Full Code [id number]  Gwynne Edinger, MD Inpatient   12/30/2012 0245 12/31/2012 1340 Full Code [id number]  Etta Quill, DO Inpatient    Advance Directive Documentation     Most Recent Value  Type of Advance Directive  Healthcare Power of Attorney, Living will  Pre-existing out of facility DNR order (yellow form or pink MOST form)  -  "MOST" Form in Place?  -      Home/SNF/Other Home  Chief Complaint shob;weakness  Level of Care/Admitting Diagnosis ED Disposition    ED Disposition Condition [place]: [place] [[id number]]  Level of Care: Telemetry [5]  Admit to tele based on following criteria: Acute CHF  Diagnosis: Acute diastolic CHF (congestive heart failure) [facility]) [[id number]]  Admitting Physician: Edwin Dada [[id number]]  Attending Physician: Edwin Dada [[id number]]  Estimated length of stay: past midnight tomorrow  Certification:: I certify this patient will need inpatient services for at least 2 midnights  PT Class (Do Not Modify): Inpatient [101]  PT Acc Code (Do Not Modify): Private [1]        Medical History Past Medical History:  Diagnosis Date  . Arthritis   . CHF exacerbation ([place]) 04/22/2017  . CLL (chronic lymphocytic leukemia) ([place]) 11/10/2012  . Hypertension   . Hypogammaglobulinemia ([place]) 04/29/2017  . Knee pain, left   . Total knee replacement status     Allergies No Known Allergies  IV Location/Drains/Wounds Patient Lines/Drains/Airways Status   Active Line/Drains/Airways    Name:   Placement date:   Placement time:   Site:   Days:   Peripheral IV 04/03/2018 Left Antecubital   04/13/2018    1341    Antecubital   less than 1   External Urinary Catheter   04/12/2018    1430    -   less than 1          Labs/Imaging Results for orders placed or performed during the hospital encounter of 04/16/2018 (from the past 48 hour(s))  CBC with Differential/Platelet     Status: Abnormal   Collection Time: 04/05/2018  1:41 PM  Result Value Ref Range   WBC 15.5 (H) 4.0 - 10.5 K/uL    Comment: WHITE COUNT CONFIRMED ON SMEAR   RBC 5.47 (H) 3.87 - 5.11 MIL/uL   Hemoglobin 14.8 12.0 - 15.0 g/dL   HCT 52.0 (H) 36.0 - 46.0 %   MCV 95.1 80.0 - 100.0 fL   MCH 27.1 26.0 - 34.0 pg  MCHC 28.5 (L) 30.0 - 36.0 g/dL   RDW 14.6 11.5 - 15.5 %   Platelets 152 150 - 400 K/uL   nRBC 0.0 0.0 - 0.2 %   Neutrophils Relative % 29 %   Neutro Abs 4.5 1.7 - 7.7 K/uL   Lymphocytes Relative 65 %   Lymphs Abs 10.0 (H) 0.7 - 4.0 K/uL   Monocytes Relative 6 %   Monocytes Absolute 0.9 0.1 - 1.0 K/uL   Eosinophils Relative 0 %   Eosinophils Absolute 0.0 0.0 - 0.5 K/uL   Basophils Relative 0 %   Basophils Absolute 0.0 0.0 - 0.1 K/uL   WBC Morphology LYMPHOCYTOSIS    Immature Granulocytes 0 %   Abs Immature Granulocytes 0.06 0.00 - 0.07 K/uL   Smudge Cells PRESENT     Comment: Performed at [facility], [place] [address], [place], [place] [address]  Comprehensive metabolic panel     Status: Abnormal   Collection Time: 04/24/2018  1:41 PM  Result Value Ref Range   Sodium 138  135 - 145 mmol/L   Potassium 5.2 (H) 3.5 - 5.1 mmol/L   Chloride 100 98 - 111 mmol/L   CO2 31 22 - 32 mmol/L   Glucose, Bld 123 (H) 70 - 99 mg/dL   BUN 33 (H) 8 - 23 mg/dL   Creatinine, Ser 1.01 (H) 0.44 - 1.00 mg/dL   Calcium 9.9 8.9 - 10.3 mg/dL   Total Protein 6.6 6.5 - 8.1 g/dL   Albumin 4.4 3.5 - 5.0 g/dL   AST 21 15 - 41 U/L   ALT 21 0 - 44 U/L   Alkaline Phosphatase 57 38 - 126 U/L   Total Bilirubin 0.8 0.3 - 1.2 mg/dL   GFR calc non Af Amer 50 (L) >60 mL/min   GFR calc Af Amer 58 (L) >60 mL/min   Anion gap 7 5 - 15    Comment: Performed at [facility], [place] [address] [place], [place] [address]  Brain natriuretic peptide     Status: Abnormal   Collection Time: 04/12/2018  1:41 PM  Result Value Ref Range   B Natriuretic Peptide 110.5 (H) 0.0 - 100.0 pg/mL    Comment: Performed at [facility], [place] [address], [place], [place] [address]  I-stat troponin, ED     Status: None   Collection Time: 04/01/2018  1:46 PM  Result Value Ref Range   Troponin i, poc 0.01 0.00 - 0.08 ng/mL   Comment 3            Comment: Due to the release kinetics of cTnI, a negative result within the first hours of the onset of symptoms does not rule out myocardial infarction with certainty. If myocardial infarction is still suspected, repeat the test at appropriate intervals.    Dg Chest Port 1 View  Result Date: 04/24/2018 CLINICAL DATA:  Shortness of breath.  Weakness. EXAM: PORTABLE CHEST 1 VIEW COMPARISON:  03/11/2018. FINDINGS: Cardiomegaly with pulmonary venous congestion and severe bilateral interstitial prominence. Moderate bilateral pleural effusions. Findings consistent with CHF. IMPRESSION: Congestive heart failure with prominent interstitial pulmonary edema. Mild bilateral pleural effusions. Electronically Signed   By: Marcello Moores  Register   On: 04/12/2018 14:02    Pending Labs Unresulted Labs (From admission, onward)    Start     Ordered   04/11/18  0500  Creatinine, serum  (enoxaparin (LOVENOX)    CrCl >/= 30 ml/min)  Weekly,   R  Comments:  while on enoxaparin therapy    04/28/2018 1538   04/05/18 0500  CBC  Tomorrow morning,   R     04/20/2018 1538   04/05/18 4536  Basic metabolic panel  Daily,   R     04/21/2018 1538   04/17/2018 1538  TSH  Add-on,   R     04/14/2018 1538          Vitals/Pain Today's Vitals   04/25/2018 1400 04/19/2018 1430 04/21/2018 1451 04/12/2018 1500  BP: 117/81 133/74 133/74 140/84  Pulse: 83 82 81 84  Resp: 17 20 16  (!) 21  Temp:      TempSrc:      SpO2: 94% 95% 96% 96%  Weight:      PainSc:   Asleep     Isolation Precautions No active isolations  Medications Medications  furosemide (LASIX) injection 40 mg (has no administration in time range)  losartan (COZAAR) tablet 50 mg (has no administration in time range)  amLODipine (NORVASC) tablet 5 mg (has no administration in time range)  latanoprost (XALATAN) 0.005 % ophthalmic solution 1 drop (has no administration in time range)  enoxaparin (LOVENOX) injection 40 mg (has no administration in time range)  furosemide (LASIX) injection 80 mg (has no administration in time range)  ondansetron (ZOFRAN) tablet 4 mg (has no administration in time range)    Or  ondansetron (ZOFRAN) injection 4 mg (has no administration in time range)  acetaminophen (TYLENOL) tablet 650 mg (has no administration in time range)    Or  acetaminophen (TYLENOL) suppository 650 mg (has no administration in time range)  furosemide (LASIX) injection 40 mg (40 mg Intravenous Given 04/10/2018 1447)    Mobility walks with device

## 2018-04-04 NOTE — Telephone Encounter (Signed)
Received call from [place], daughter, stating,"I'm ready to take mom to the hospital to be admitted for fluids because she is not drinking or eating. I can't get her oxygen level up, she is severely fatigued, and can't get up and walk. I was hoping Dr. Marin Olp could see her today. My return number is [phone]." I returned phone call and spoke to husband due to [name] had already left the house. Husband stated,"she is bad off. I think she is going to end up in the hospital." I informed him that Dr. Marin Olp works half a day on Tuesday's. She needs to be seen by her PCP or go to the closest ER to be assessed. He verbalized understanding.

## 2018-04-04 NOTE — ED Provider Notes (Signed)
[facility] DEPT Provider Note   CSN: [id number] Arrival date & time: 04/27/2018  1222     History   Chief Complaint Chief Complaint  Patient presents with  . Shortness of Breath  . Weakness    HPI [name] is a 85 y.o. female.  Patient is an 85 year old female with a history of CLL, hypertension, diastolic CHF who is presenting today with worsening symptoms over the last 2 weeks.  Patient states that she feels terrible.  She has generalized weakness, fatigue, shortness of breath, intermittent productive cough of sputum, tightness in her abdomen and around her chest.  She was having swelling in her legs but went to her doctor's office twice last week and had her Lasix increased to 80 mg twice a day.  Daughter however states that she seemed to be getting weaker in the last few days and she stopped the Lasix.  When EMS arrived at the patient's house today she was found to have oxygen saturation of 85% on 2 L and was increased to 6 L.  Patient denies any chest pain but also complains of abdominal fullness and despite taking MiraLAX and other laxatives she is not having regular bowel movements.  She does not check her weight regularly.  She denies any dysuria and did have one episode of vomiting today but has had ongoing nausea as well.  The history is provided by the patient, a relative and a caregiver.  Shortness of Breath  Severity:  Severe Onset quality:  Gradual Duration:  2 weeks Timing:  Constant Progression:  Worsening Chronicity:  Recurrent Context: activity   Relieved by:  Nothing Worsened by:  Exertion and movement Ineffective treatments:  Diuretics Associated symptoms: cough and sputum production   Associated symptoms: no wheezing   Associated symptoms comment:  Sob, exertional dypsnea, weakness Weakness  Associated symptoms: cough and shortness of breath     Past Medical History:  Diagnosis Date  . Arthritis   . CHF exacerbation  ([place]) 04/22/2017  . CLL (chronic lymphocytic leukemia) ([place]) 11/10/2012  . Hypertension   . Hypogammaglobulinemia ([place]) 04/29/2017  . Knee pain, left   . Total knee replacement status     Patient Active Problem List   Diagnosis Date Noted  . Hypogammaglobulinemia ([place]) 04/29/2017  . RBBB 04/25/2017  . Nonrheumatic aortic valve stenosis   . Acute respiratory failure with hypoxia ([place]) 04/22/2017  . Chronic hypertension 04/22/2017  . Glaucoma 04/22/2017  . Acute diastolic CHF (congestive heart failure) ([place]) 04/22/2017  . Anemia 04/22/2017  . Thrombocytopenia ([place]) 04/22/2017  . Acute respiratory failure ([place]) 04/22/2017  . Chest pain 12/30/2012  . Hypertensive urgency 12/30/2012  . CLL (chronic lymphocytic leukemia) ([place]) 11/10/2012    Past Surgical History:  Procedure Laterality Date  . APPENDECTOMY    . BLEPHAROPLASTY  2003  . carpel tunnel surgery     bilateral  . CHOLECYSTECTOMY    . COLON SURGERY    . HERNIA REPAIR     left  . ORIF HIP FRACTURE Left 2003   original left hip fx and ORIF 06/2001.  s/p 10/2001 Interchange and open reduction internal fixation of hip screw.  s/p 12/2001 Hardware removal left hip.  Marland Kitchen TOTAL KNEE ARTHROPLASTY Left 2008   Dr Maureen Ralphs.       OB History   No obstetric history on file.      Home Medications    Prior to Admission medications   Medication Sig Start Date End Date Taking?  Authorizing Provider  amLODipine (NORVASC) 5 MG tablet Take 1 tablet (5 mg total) by mouth daily. 12/31/12  Yes Vann, Jessica U, DO  fluticasone (FLONASE) 50 MCG/ACT nasal spray Place 1 spray into both nostrils daily. Patient taking differently: Place 1 spray into both nostrils daily as needed for allergies or rhinitis.  04/28/17  Yes Ghimire, Henreitta [place], MD  latanoprost (XALATAN) 0.005 % ophthalmic solution Place 1 drop into the right eye at bedtime.   Yes [provider]  losartan (COZAAR) 50 MG tablet Take 50 mg by mouth daily. 04/29/17  Yes [provider]  vitamin C (ASCORBIC ACID) 500 MG tablet Take 1,000 mg by mouth daily.   Yes [provider]  furosemide (LASIX) 40 MG tablet TAKE 1 TABLET BY MOUTH TWICE A DAY Patient not taking: Reported on 04/12/2018 11/07/17   [name], MD  loratadine (CLARITIN) 10 MG tablet Take 1 tablet (10 mg total) by mouth daily. Patient not taking: Reported on 11/10/2017 04/28/17   Jonetta Osgood, MD  potassium chloride (K-DUR) 10 MEQ tablet Take 2 tablets (20 mEq total) by mouth daily. Patient not taking: Reported on 04/18/2018 04/27/17   Jonetta Osgood, MD    Family History History reviewed. No pertinent family history.  Social History Social History   Tobacco Use  . Smoking status: Former Smoker    Packs/day: 1.00    Years: 28.00    Pack years: 28.00    Types: Cigarettes    Start date: 02/[birth year]/1946    Last attempt to quit: 12/16/1973    Years since quitting: 44.3  . Smokeless tobacco: Never Used  . Tobacco comment: quit 40 years ago  Substance Use Topics  . Alcohol use: Yes    Alcohol/week: 0.0 standard drinks  . Drug use: Not on file     Allergies   Patient has no known allergies.   Review of Systems Review of Systems  Respiratory: Positive for cough, sputum production and shortness of breath. Negative for wheezing.   Neurological: Positive for weakness.  All other systems reviewed and are negative.    Physical Exam Updated Vital Signs BP 133/74   Pulse 81   Temp (!) 97.5 F (36.4 C) (Oral)   Resp 16   Wt 81.6 kg   SpO2 96%   BMI 36.36 kg/m   Physical Exam Vitals signs and nursing note reviewed.  Constitutional:      General: She is not in acute distress.    Appearance: She is well-developed. She is ill-appearing.  HENT:     Head: Normocephalic and atraumatic.  Eyes:     Conjunctiva/sclera: Conjunctivae normal.     Pupils: Pupils are equal, round, and reactive to light.  Neck:     Musculoskeletal: Normal range of motion and neck supple.    Cardiovascular:     Rate and Rhythm: Normal rate and regular rhythm.     Heart sounds: Murmur present. Systolic murmur present with a grade of 3/6.  Pulmonary:     Effort: Pulmonary effort is normal. No respiratory distress.     Breath sounds: Normal breath sounds. No wheezing or rales.  Abdominal:     General: There is distension.     Palpations: Abdomen is soft.     Tenderness: There is no abdominal tenderness. There is no guarding or rebound.  Musculoskeletal: Normal range of motion.        General: No tenderness.     Right lower leg: Edema present.  Left lower leg: Edema present.     Comments: Trace edema in bilateral lower ext  Skin:    General: Skin is warm and dry.     Findings: No erythema or rash.  Neurological:     Mental Status: She is alert and oriented to person, place, and time. Mental status is at baseline.  Psychiatric:        Behavior: Behavior normal.      ED Treatments / Results  Labs (all labs ordered are listed, but only abnormal results are displayed) Labs Reviewed  CBC WITH DIFFERENTIAL/PLATELET - Abnormal; Notable for the following components:      Result Value   WBC 15.5 (*)    RBC 5.47 (*)    HCT 52.0 (*)    MCHC 28.5 (*)    Lymphs Abs 10.0 (*)    All other components within normal limits  COMPREHENSIVE METABOLIC PANEL - Abnormal; Notable for the following components:   Potassium 5.2 (*)    Glucose, Bld 123 (*)    BUN 33 (*)    Creatinine, Ser 1.01 (*)    GFR calc non Af Amer 50 (*)    GFR calc Af Amer 58 (*)    All other components within normal limits  BRAIN NATRIURETIC PEPTIDE - Abnormal; Notable for the following components:   B Natriuretic Peptide 110.5 (*)    All other components within normal limits  I-STAT TROPONIN, ED    EKG EKG Interpretation  Date/Time:  Tuesday April 04 2018 13:29:17 EST Ventricular Rate:  84 PR Interval:    QRS Duration: 143 QT Interval:  373 QTC Calculation: 441 R Axis:   88 Text  Interpretation:  Sinus rhythm Right bundle branch block Abnormal inferior Q waves No significant change since last tracing Confirmed by Blanchie Dessert ([id number]) on 04/01/2018 2:02:26 PM   Radiology Dg Chest Port 1 View  Result Date: 03/31/2018 CLINICAL DATA:  Shortness of breath.  Weakness. EXAM: PORTABLE CHEST 1 VIEW COMPARISON:  03/11/2018. FINDINGS: Cardiomegaly with pulmonary venous congestion and severe bilateral interstitial prominence. Moderate bilateral pleural effusions. Findings consistent with CHF. IMPRESSION: Congestive heart failure with prominent interstitial pulmonary edema. Mild bilateral pleural effusions. Electronically Signed   By: Marcello Moores  Register   On: 04/02/2018 14:02    Procedures Procedures (including critical care time)  Medications Ordered in ED Medications  furosemide (LASIX) injection 40 mg (40 mg Intravenous Given 04/05/2018 1447)     Initial Impression / Assessment and Plan / ED Course  I have reviewed the triage vital signs and the nursing notes.  Pertinent labs & imaging results that were available during my care of the patient were reviewed by me and considered in my medical decision making (see chart for details).     Patient presenting with symptoms of weakness, shortness of breath, hypoxia concern for fluid overload.  Patient did have an episode of emesis today but has not had fever or other infectious symptoms.  She has had an intermittent cough which she says sometimes has mucus but nothing significant.  She has seen her doctor twice last week for fluid overload when she had significant swelling in her legs.  Daughter states they increased her Lasix to 80 mg twice daily and they were monitoring her renal function.  However patient continues to start getting weaker and feel worse so they stopped the diuretic.  Patient today has trace fluid in her lower extremities but distention in her abdomen and rales in her lower lobes.  X-ray  is consistent with pulmonary  edema and small pleural effusions.  EKG without acute findings, troponin is negative, BNP is elevated to 110 which is about the same bullet was the last time she was in diastolic heart failure.  Last echo was last year about the same time and showed an EF of 55%.  Patient also has a history of CLL and sees Dr. Marin Olp.  He had recently told them that he wanted to her start her back on her CLL medication but she wanted to wait till after the holidays.  His office was called but he is currently on his day off but he will round on her in the morning.  White count today is 15.  Patient's creatinine is stable and she was given IV Lasix.  Will admit for further care.  Final Clinical Impressions(s) / ED Diagnoses   Final diagnoses:  Acute on chronic congestive heart failure, unspecified heart failure type [facility])  Generalized weakness    ED Discharge Orders    None       Blanchie Dessert, MD 04/24/2018 1525

## 2018-04-04 NOTE — Telephone Encounter (Signed)
Returned Arlene's phone call regarding her mother. Left voice mail message for her to call me back.

## 2018-04-05 ENCOUNTER — Encounter (HOSPITAL_COMMUNITY): Payer: Self-pay | Admitting: Cardiology

## 2018-04-05 ENCOUNTER — Inpatient Hospital Stay (HOSPITAL_COMMUNITY): Payer: Medicare HMO

## 2018-04-05 DIAGNOSIS — R0609 Other forms of dyspnea: Secondary | ICD-10-CM

## 2018-04-05 DIAGNOSIS — C9110 Chronic lymphocytic leukemia of B-cell type not having achieved remission: Secondary | ICD-10-CM

## 2018-04-05 DIAGNOSIS — J9601 Acute respiratory failure with hypoxia: Secondary | ICD-10-CM

## 2018-04-05 DIAGNOSIS — J9600 Acute respiratory failure, unspecified whether with hypoxia or hypercapnia: Secondary | ICD-10-CM

## 2018-04-05 DIAGNOSIS — I350 Nonrheumatic aortic (valve) stenosis: Secondary | ICD-10-CM

## 2018-04-05 DIAGNOSIS — I509 Heart failure, unspecified: Secondary | ICD-10-CM

## 2018-04-05 DIAGNOSIS — I10 Essential (primary) hypertension: Secondary | ICD-10-CM

## 2018-04-05 DIAGNOSIS — I313 Pericardial effusion (noninflammatory): Secondary | ICD-10-CM

## 2018-04-05 DIAGNOSIS — I5033 Acute on chronic diastolic (congestive) heart failure: Secondary | ICD-10-CM

## 2018-04-05 LAB — BLOOD GAS, ARTERIAL
ACID-BASE EXCESS: 3.1 mmol/L — AB (ref 0.0–2.0)
Acid-Base Excess: 2.7 mmol/L — ABNORMAL HIGH (ref 0.0–2.0)
Bicarbonate: 32.4 mmol/L — ABNORMAL HIGH (ref 20.0–28.0)
Bicarbonate: 32 mmol/L — ABNORMAL HIGH (ref 20.0–28.0)
DRAWN BY: 295031
Delivery systems: POSITIVE
Drawn by: 331471
Expiratory PAP: 6
FIO2: 40
Inspiratory PAP: 16
Mode: POSITIVE
O2 Content: 3 L/min
O2 Saturation: 81.1 %
O2 Saturation: 91.4 %
Patient temperature: 98.6
Patient temperature: 98.6
pCO2 arterial: 79.2 mmHg (ref 32.0–48.0)
pCO2 arterial: 74.2 mmHg (ref 32.0–48.0)
pH, Arterial: 7.236 — ABNORMAL LOW (ref 7.350–7.450)
pH, Arterial: 7.258 — ABNORMAL LOW (ref 7.350–7.450)
pO2, Arterial: 49.4 mmHg — ABNORMAL LOW (ref 83.0–108.0)
pO2, Arterial: 65.1 mmHg — ABNORMAL LOW (ref 83.0–108.0)

## 2018-04-05 LAB — CBC
HCT: 45.4 % (ref 36.0–46.0)
Hemoglobin: 13.2 g/dL (ref 12.0–15.0)
MCH: 27.2 pg (ref 26.0–34.0)
MCHC: 29.1 g/dL — ABNORMAL LOW (ref 30.0–36.0)
MCV: 93.4 fL (ref 80.0–100.0)
NRBC: 0 % (ref 0.0–0.2)
Platelets: 166 10*3/uL (ref 150–400)
RBC: 4.86 MIL/uL (ref 3.87–5.11)
RDW: 14.5 % (ref 11.5–15.5)
WBC: 16.4 10*3/uL — ABNORMAL HIGH (ref 4.0–10.5)

## 2018-04-05 LAB — BASIC METABOLIC PANEL
Anion gap: 6 (ref 5–15)
BUN: 35 mg/dL — ABNORMAL HIGH (ref 8–23)
CO2: 31 mmol/L (ref 22–32)
Calcium: 9.5 mg/dL (ref 8.9–10.3)
Chloride: 99 mmol/L (ref 98–111)
Creatinine, Ser: 1 mg/dL (ref 0.44–1.00)
GFR calc Af Amer: 59 mL/min — ABNORMAL LOW (ref >60)
GFR calc non Af Amer: 51 mL/min — ABNORMAL LOW (ref >60)
Glucose, Bld: 131 mg/dL — ABNORMAL HIGH (ref 70–99)
Potassium: 4.9 mmol/L (ref 3.5–5.1)
Sodium: 136 mmol/L (ref 135–145)

## 2018-04-05 LAB — ECHOCARDIOGRAM COMPLETE: Weight: 2880 oz

## 2018-04-05 LAB — MRSA PCR SCREENING: MRSA by PCR: NEGATIVE

## 2018-04-05 LAB — STREP PNEUMONIAE URINARY ANTIGEN: Strep Pneumo Urinary Antigen: NEGATIVE

## 2018-04-05 LAB — PROCALCITONIN: Procalcitonin: <0.1 ng/mL

## 2018-04-05 MED ORDER — POTASSIUM CHLORIDE CRYS ER 20 MEQ PO TBCR
20.0000 meq | EXTENDED_RELEASE_TABLET | Freq: Two times a day (BID) | ORAL | Status: DC
  Administered 2018-04-06: 20 meq via ORAL
  Filled 2018-04-05: qty 1

## 2018-04-05 MED ORDER — SODIUM CHLORIDE 0.9 % IV SOLN
500.0000 mg | INTRAVENOUS | Status: DC
  Administered 2018-04-05 – 2018-04-06 (×2): 500 mg via INTRAVENOUS
  Filled 2018-04-05 (×3): qty 500

## 2018-04-05 MED ORDER — PANTOPRAZOLE SODIUM 40 MG IV SOLR
40.0000 mg | INTRAVENOUS | Status: DC
  Administered 2018-04-05: 40 mg via INTRAVENOUS
  Filled 2018-04-05: qty 40

## 2018-04-05 MED ORDER — METOLAZONE 5 MG PO TABS
5.0000 mg | ORAL_TABLET | Freq: Once | ORAL | Status: DC
  Filled 2018-04-05: qty 1

## 2018-04-05 MED ORDER — SODIUM CHLORIDE 0.9 % IV SOLN
1.0000 g | INTRAVENOUS | Status: DC
  Administered 2018-04-05 – 2018-04-06 (×2): 1 g via INTRAVENOUS
  Filled 2018-04-05: qty 10
  Filled 2018-04-05 (×2): qty 1

## 2018-04-05 MED ORDER — LORAZEPAM 2 MG/ML IJ SOLN
1.0000 mg | Freq: Four times a day (QID) | INTRAMUSCULAR | Status: DC | PRN
  Administered 2018-04-05: 1 mg via INTRAVENOUS
  Filled 2018-04-05: qty 1

## 2018-04-05 MED ORDER — LORAZEPAM 2 MG/ML IJ SOLN
1.0000 mg | Freq: Once | INTRAMUSCULAR | Status: AC
  Administered 2018-04-05: 1 mg via INTRAVENOUS

## 2018-04-05 MED ORDER — LORAZEPAM 2 MG/ML IJ SOLN
1.0000 mg | INTRAMUSCULAR | Status: DC | PRN
  Administered 2018-04-05 – 2018-04-06 (×3): 1 mg via INTRAVENOUS
  Filled 2018-04-05 (×3): qty 1

## 2018-04-05 MED ORDER — MORPHINE SULFATE (PF) 2 MG/ML IV SOLN: 1.0000 mg | INTRAVENOUS | Status: DC | PRN

## 2018-04-05 MED ORDER — FUROSEMIDE 10 MG/ML IJ SOLN
80.0000 mg | Freq: Three times a day (TID) | INTRAMUSCULAR | Status: DC
  Administered 2018-04-05: 80 mg via INTRAVENOUS
  Filled 2018-04-05: qty 8

## 2018-04-05 MED ORDER — METOLAZONE 5 MG PO TABS
5.0000 mg | ORAL_TABLET | Freq: Once | ORAL | Status: AC
  Administered 2018-04-05: 5 mg via ORAL
  Filled 2018-04-05: qty 1

## 2018-04-05 MED ORDER — FUROSEMIDE 10 MG/ML IJ SOLN
120.0000 mg | Freq: Two times a day (BID) | INTRAVENOUS | Status: DC
  Administered 2018-04-06: 120 mg via INTRAVENOUS
  Filled 2018-04-05 (×2): qty 12
  Filled 2018-04-05: qty 2

## 2018-04-05 MED ORDER — LORAZEPAM 2 MG/ML IJ SOLN
INTRAMUSCULAR | Status: AC
  Filled 2018-04-05: qty 1

## 2018-04-05 NOTE — Procedures (Signed)
Echo attempted. Nurse stated patient was about to be moved to new room. Will attempt again later.

## 2018-04-05 NOTE — Progress Notes (Signed)
Discussed code status with family.  Husband and daughter/POA feel that patient's wishes would have been to undergo temporary life support if needed.  They feel she would have wanted full code efforts in event of cardiorespiratory collapse.    Will change code status to FULL.  On bIPAP.

## 2018-04-05 NOTE — Progress Notes (Signed)
ABG results given to RN. Pt pending for ICU stepdown bed and BIPAP. RN states she will give MD ABG Results.

## 2018-04-05 NOTE — Progress Notes (Signed)
  Echocardiogram 2D Echocardiogram has been performed.  [name] 04/05/2018, 1:29 PM

## 2018-04-05 NOTE — Consult Note (Signed)
NAME:  [name], MRN:  [id number], DOB:  10-Dec-1928, LOS: 1 ADMISSION DATE:  04/19/2018, CONSULTATION DATE:  04/05/2018 REFERRING MD:  Loleta Books -THN, CHIEF COMPLAINT:  dyspnea  Brief History   85 yo F with CLL and CHF lasix dependent at home, presenting 1/7 with dyspnea x 2 weeks. Admitted and eval'd by cardiology, hospitalist. PCCM asked to consult 1/8 for acute respiratory failure.  History of present illness   85 yo F with PMH CHF, CLL, HTN who presented to ED 1/7 with dyspnea and weakness. The patient began feeling short of breath approximately 2 weeks ago, with progressive shortness of breath and edema prompting presentation to OSH ED for diuresis and subsequent visit to PCP for increase in lasix dose. Edema persisted and dyspnea progressed. Home Lasix held x2 days as it was not felt to improve pedal edema. Dyspnea continued to worsen with increasing home O2 requirement and patient then presented to [facility] ED.   Since admission, patient has been seen by [facility] and cardiology for heart failure. Her respiratory status has not improved despite diuresis and BiPAP. PCCM has been consulted.   Past Medical History  CLL CHF HTN  Significant Hospital Events   1/7 Admitted for dyspnea  1/8 PCCM consulted after no clinical improvement with BiPAP  Consults:  Cardiology  PCCM  Procedures:    Significant Diagnostic Tests:  1/7 CXR> moderate pleural effusion, enlarged cardiac silhouette  1/8 ECHO> early sings of tamponade. LVEF 55-60%  Micro Data:  BCx 1/7>> MRSA 1/7> neg  Antimicrobials:  Azithromycin 1/7>> Rocephin 1/7>>  Interim history/subjective:  Worsening dyspnea, not improved with Lasix, BiPAP.  PCCM consulted Objective   Blood pressure 120/62, pulse 69, temperature 97.7 F (36.5 C), temperature source Axillary, resp. rate 14, weight 81.6 kg, SpO2 92 %.        Intake/Output Summary (Last 24 hours) at 04/05/2018 1551 Last data filed at 04/05/2018 1415 Gross per 24 hour    Intake 260 ml  Output 1282 ml  Net -1022 ml   Filed Weights   04/24/2018 1237  Weight: 81.6 kg    Examination: General: critically ill and frail appearing older adult female with signs of acute distress  HENT: [place] AT. Poor dentition with several missing teeth. Tacky mucus membranes. Trachea midline Lungs: Course crackles bilaterally, on bipap Cardiovascular: RRR, 1+ radial and pedal pulses. Capillary refil < 3 seconds BUE BLE  Abdomen: Soft round non-distended non-tender.  Extremities: No obvious joint deformity. Mild peripheral edema.  Neuro: Awake, agitated, intermittently oriented to self. Intermittently follows simple commands.  Skin: Clean, dry, warm, intact  Resolved Hospital Problem list     Assessment & Plan:    Acute Respiratory failure with hypoxemia and hypercapnia requiring positive pressure ventilation -Bilateral pleural effusions, pulmonary edema  P -Continue BiPAP -Continue Diuresis per [facility] and Cardiology  -goal SpO2 > 88% -Will not escalate to endotracheal intubation -PRN ativan available for dyspnea, anxiety -If dyspnea and agitation worsens, consider morphine gtt   Congestive Heart Failure -Lasix dependent at home with recent increased lasix requirment -Prior to presentation, 2 days without home lasix -Has received 160 Lasix and 5mg  metolazone, 535ml UOP P -Continue Diuresis, per [facility] and Cardiology discretion   ? Tamponade -early signs on echo, appreciated by cardiology -pericardial effusion  P -cardiology following -Consideration being given to cardiovascular risks associated with endotracheal intubation given early signs of tamponade    Goals of Care -Lengthy discussion between husband, 2 adult children, Dr. Lake Bells regarding risks and  benefits of invasive procedures, life support, trajectory of illness and care -given patient's critical status, measures that were carefully considered included endotracheal intubation for acute respiratory  failure (not responding to BiPAP), Thoracentesis for pleural effusions (not responding to Lasix) and pericardiocentesis -With careful consideration and discussion with family, invasive procedures will not be pursued P -DNR/I implemented  -Will continue current level of respiratory support, BiPAP and continue diuresis  -PRN ativan for dyspnea, agitation -If worsening dyspnea, agitation, recommend morphine     Thank you for consulting PCCM. PCCM will sign off at this point. Please let us know if you have any further questions or if any further assistance is needed.   Best practice:  Diet: NPO Pain/Anxiety/Delirium protocol (if indicated): PRN tylenol, ativan VAP protocol (if indicated):  DVT prophylaxis: lovenox GI prophylaxis: protonix Glucose control: n/a Mobility: bedrest Code Status: DNR/I Family Communication: Husband, 2 adult children at bedside.  Disposition: ICU  Labs   CBC: Recent Labs  Lab 04/21/2018 1341 04/05/18 0536  WBC 15.5* 16.4*  NEUTROABS 4.5  --   HGB 14.8 13.2  HCT 52.0* 45.4  MCV 95.1 93.4  PLT 152 676    Basic Metabolic Panel: Recent Labs  Lab 04/03/2018 1341 04/05/18 0536  NA 138 136  K 5.2* 4.9  CL 100 99  CO2 31 31  GLUCOSE 123* 131*  BUN 33* 35*  CREATININE 1.01* 1.00  CALCIUM 9.9 9.5   GFR: Estimated Creatinine Clearance: 36.7 mL/min (by C-G formula based on SCr of 1 mg/dL). Recent Labs  Lab 04/03/2018 1341 04/05/18 0536  PROCALCITON  --  <0.10  WBC 15.5* 16.4*    Liver Function Tests: Recent Labs  Lab 04/01/2018 1341  AST 21  ALT 21  ALKPHOS 57  BILITOT 0.8  PROT 6.6  ALBUMIN 4.4   No results for input(s): LIPASE, AMYLASE in the last 168 hours. No results for input(s): AMMONIA in the last 168 hours.  ABG    Component Value Date/Time   PHART 7.258 (L) 04/05/2018 1410   PCO2ART 74.2 (HH) 04/05/2018 1410   PO2ART 65.1 (L) 04/05/2018 1410   HCO3 32.0 (H) 04/05/2018 1410   O2SAT 91.4 04/05/2018 1410     Coagulation  Profile: No results for input(s): INR, PROTIME in the last 168 hours.  Cardiac Enzymes: No results for input(s): CKTOTAL, CKMB, CKMBINDEX, TROPONINI in the last 168 hours.  HbA1C: No results found for: HGBA1C  CBG: No results for input(s): GLUCAP in the last 168 hours.  Review of Systems:   Unable to obtain from patient due to confusion, obtained from family. Endorses progressive shortness of breath, confusion, BLE edema. No chest pain, no fevers, no chills, no productive cough.   Past Medical History  She,  has a past medical history of Arthritis, CHF exacerbation ([place]) (04/22/2017), CLL (chronic lymphocytic leukemia) ([place]) (11/10/2012), Hypertension, Hypogammaglobulinemia ([place]) (04/29/2017), Knee pain, left, and Total knee replacement status.   Surgical History    Past Surgical History:  Procedure Laterality Date  . APPENDECTOMY    . BLEPHAROPLASTY  2003  . carpel tunnel surgery     bilateral  . CHOLECYSTECTOMY    . COLON SURGERY    . HERNIA REPAIR     left  . ORIF HIP FRACTURE Left 2003   original left hip fx and ORIF 06/2001.  s/p 10/2001 Interchange and open reduction internal fixation of hip screw.  s/p 12/2001 Hardware removal left hip.  Marland Kitchen TOTAL KNEE ARTHROPLASTY Left 2008   Dr Maureen Ralphs.  Social History   reports that she quit smoking about 44 years ago. Her smoking use included cigarettes. She started smoking about 72 years ago. She has a 28.00 pack-year smoking history. She has never used smokeless tobacco. She reports current alcohol use.   Family History   Her family history includes Uterine cancer in her daughter.   Allergies No Known Allergies   Home Medications  Prior to Admission medications   Medication Sig Start Date End Date Taking? Authorizing Provider  amLODipine (NORVASC) 5 MG tablet Take 1 tablet (5 mg total) by mouth daily. 12/31/12  Yes Vann, Jessica U, DO  fluticasone (FLONASE) 50 MCG/ACT nasal spray Place 1 spray into both nostrils  daily. Patient taking differently: Place 1 spray into both nostrils daily as needed for allergies or rhinitis.  04/28/17  Yes Ghimire, Henreitta Leber, MD  latanoprost (XALATAN) 0.005 % ophthalmic solution Place 1 drop into the right eye at bedtime.   Yes [provider]  losartan (COZAAR) 50 MG tablet Take 50 mg by mouth daily. 04/29/17  Yes [provider]  vitamin C (ASCORBIC ACID) 500 MG tablet Take 1,000 mg by mouth daily.   Yes [provider]     Critical care time: 45 minutes     Eliseo Gum MSN, AGACNP-BC [place] 04/05/2018, 5:33 PM

## 2018-04-05 NOTE — Plan of Care (Signed)
Pt's  O2 stayed between  92 and 98 % on 5 LPM. O2 lowered to 4 LPM. Pt had 2 BM's this shift and good urine OP. Not all urine could be measured.

## 2018-04-05 NOTE — Progress Notes (Signed)
PROGRESS NOTE    [name]  HGD:[id number] DOB: [birth year] DOA: 04/17/2018 PCP: Shirline Frees, MD      Brief Narrative:  [name] is a 85 y.o. F with CLL, dCHF on intermittent home O2, and HTN who presents with 2 weeks progressive dyspnea on exertion and leg swelling.  Usual state of health until about 2 weeks ago, she started to develop new dyspnea on exertion and leg swelling.  Saw her PCP who increased her Lasix.  The leg swelling improved, but the patient seemed to be more tired in the past few days, so daughter stopped the Lasix, thinking the tiredness was dehydration.  Finally in last few days before admission, unable to breathe when she lays flat, waking up frequently at night to sit up to catch her breath, and hypoxic at home.  No fever, sputum, chest pain.  In ER, ECG showed no STEMI, CXR showed bilateral edema with pleural effusions.  Stable on O2 by [place].  She was given Lasix and the hospitalist group were asked to evaluate for CHF.        Assessment & Plan:  Acute on chronic diastolic CHF Presented with DOE, leg swelling, edema and effusions on CXR, elevated BNP, orthopnea and PND.  EF normal last year.    Overnight, only 500cc uncharted UOP.  Renal function stable. This morning, given lasix 80 and only 90 cc by bladder scan.    -metolazone once -Repeat furosemide in 1/2 hour -Continue lasix 120 BID after that  -Obtain ABG -Start BiPAP -Transfer to SDU -Consult Cardiology -Obtain echocardiogram    Acute metabolic encephalopathy New, severe.  Presumably from hypoxia work of breathing. -Check ABG -Start BiPAP and trnasfer to ICU   Pleural effusions Bilateral, likely transudative from CHF.  No fever to suggest infection, and CLL may explain her leukocytosis, but will empirically start antibiotics -Ceftriaxone and azithromycin -Obtain blood culture -Check and trend procalcitonin, de-escalate if negative   Hypertension BP wll  controlled. -Continue amlodipine, losartan  Hyperkalemia Resolved  CLL  Obesity BMI 36  Other medications -Continue eyedrops     MDM and disposition: CRITICAL CARE Performed by: Edwin Dada Total critical care time: 45 minutes Critical care time was exclusive of separately billable procedures and treating other patients. Critical care was necessary to treat or prevent imminent or life-threatening deterioration. Critical care was time spent personally by me on the following activities: development of treatment plan with patient and/or surrogate as well as nursing, discussions with consultants, evaluation of patient's response to treatment, examination of patient, obtaining history from patient or surrogate, ordering and performing treatments and interventions, ordering and review of laboratory studies, ordering and review of radiographic studies, pulse oximetry and re-evaluation of patient's condition.    The patient was admited with a severe CHF flare.  She is not responding to Lasix 80 mg.  WIll augment with metolazone, consult Cardiology, place BiPAP and transfer to unit.    I suspect her encephalopathy is from prolonged hypoxia and increased work of breathing in setting of fialing to respond to IV diuretics, and when she starts to diurese she will improve, although I will also cover with empiric antibiotics and transfer to [place].       DVT prophylaxis: Lovenox Code Status: DO NOT RESUSCITATE Family Communication: Daughter and husband at bedside    Consultants:   Cardiology  Procedures:   Echo pending  Antimicrobials:   Ceftriaxone 1/8 >>  Azithromycin 1/8 >>    Subjective: Obtunded.  Poor UOP overnight.  Mostly not responsive to daughter overnight.  Objective: Vitals:   04/02/2018 1609 04/08/2018 2223 04/01/2018 2256 04/05/18 0600  BP:  128/61  (!) 123/56  Pulse:  81  82  Resp:    20  Temp:   97.6 F (36.4 C) 97.9 F (36.6 C)  TempSrc:    Oral Oral  SpO2: 94% 90%  (!) 85%  Weight:        Intake/Output Summary (Last 24 hours) at 04/05/2018 1047 Last data filed at 04/05/2018 0744 Gross per 24 hour  Intake 240 ml  Output 1082 ml  Net -842 ml   Filed Weights   03/29/2018 1237  Weight: 81.6 kg    Examination: General appearance: elderly adult female, sluggish, in obvious respiratory distress.   HEENT: lids red like yesterday and lashes normal. No nasal deformity, discharge, epistaxis.  Lips dry, dentures, op dry, no oral lesions.   Skin: Warm and dry.  no jaundice.  No suspicious rashes or lesions. Cardiac: Tachycardic, regular, nl S1-S2, sys murmur noted.  Capillary refill is brisk.  JVD noted.  Trace LE edema.  Radia pulses 2+ and symmetric. Respiratory: Normal respiratory rate and rhythm.  CTAB without rales or wheezes. Abdomen: Abdomen soft.  nonfocal guarding. No ascites, distension, hepatosplenomegaly.   MSK: No deformities or effusions. Neuro: Obtunded, weak symmetrically.    Psych: Unable to assess.    Data Reviewed: I have personally reviewed following labs and imaging studies:  CBC: Recent Labs  Lab 04/05/2018 1341 04/05/18 0536  WBC 15.5* 16.4*  NEUTROABS 4.5  --   HGB 14.8 13.2  HCT 52.0* 45.4  MCV 95.1 93.4  PLT 152 220   Basic Metabolic Panel: Recent Labs  Lab 04/19/2018 1341 04/05/18 0536  NA 138 136  K 5.2* 4.9  CL 100 99  CO2 31 31  GLUCOSE 123* 131*  BUN 33* 35*  CREATININE 1.01* 1.00  CALCIUM 9.9 9.5   GFR: Estimated Creatinine Clearance: 36.7 mL/min (by C-G formula based on SCr of 1 mg/dL). Liver Function Tests: Recent Labs  Lab 03/29/2018 1341  AST 21  ALT 21  ALKPHOS 57  BILITOT 0.8  PROT 6.6  ALBUMIN 4.4   No results for input(s): LIPASE, AMYLASE in the last 168 hours. No results for input(s): AMMONIA in the last 168 hours. Coagulation Profile: No results for input(s): INR, PROTIME in the last 168 hours. Cardiac Enzymes: No results for input(s): CKTOTAL, CKMB,  CKMBINDEX, TROPONINI in the last 168 hours. BNP (last 3 results) No results for input(s): PROBNP in the last 8760 hours. HbA1C: No results for input(s): HGBA1C in the last 72 hours. CBG: No results for input(s): GLUCAP in the last 168 hours. Lipid Profile: No results for input(s): CHOL, HDL, LDLCALC, TRIG, CHOLHDL, LDLDIRECT in the last 72 hours. Thyroid Function Tests: Recent Labs    04/22/2018 1341  TSH 2.707   Anemia Panel: No results for input(s): VITAMINB12, FOLATE, FERRITIN, TIBC, IRON, RETICCTPCT in the last 72 hours. Urine analysis:    Component Value Date/Time   COLORURINE YELLOW 10/21/2006 0920   APPEARANCEUR CLEAR 10/21/2006 0920   LABSPEC 1.010 10/21/2006 0920   PHURINE 6.5 10/21/2006 0920   GLUCOSEU NEGATIVE 10/21/2006 0920   HGBUR NEGATIVE 10/21/2006 0920   BILIRUBINUR NEGATIVE 10/21/2006 0920   KETONESUR NEGATIVE 10/21/2006 0920   PROTEINUR NEGATIVE 10/21/2006 0920   UROBILINOGEN 0.2 10/21/2006 0920   NITRITE POSITIVE (A) 10/21/2006 0920   LEUKOCYTESUR SMALL (A) 10/21/2006 0920   Sepsis Labs: @  LABRCNTIP(procalcitonin:4,lacticacidven:4)  )No results found for this or any previous visit (from the past 240 hour(s)).       Radiology Studies: Dg Chest Port 1 View  Result Date: 03/29/2018 CLINICAL DATA:  Shortness of breath.  Weakness. EXAM: PORTABLE CHEST 1 VIEW COMPARISON:  03/11/2018. FINDINGS: Cardiomegaly with pulmonary venous congestion and severe bilateral interstitial prominence. Moderate bilateral pleural effusions. Findings consistent with CHF. IMPRESSION: Congestive heart failure with prominent interstitial pulmonary edema. Mild bilateral pleural effusions. Electronically Signed   By: Marcello Moores  Register   On: 04/23/2018 14:02        Scheduled Meds: . amLODipine  5 mg Oral Daily  . enoxaparin (LOVENOX) injection  40 mg Subcutaneous Q24H  . furosemide  80 mg Intravenous Q8H  . latanoprost  1 drop Right Eye QHS  . metolazone  5 mg Oral Once  .  [START ON 05-05-2018] potassium chloride  20 mEq Oral BID   Continuous Infusions: . azithromycin    . cefTRIAXone (ROCEPHIN)  IV    . [START ON 05-05-2018] furosemide       LOS: 1 day    Time spent: 45 minutes    Edwin Dada, MD [facility] 04/05/2018, 10:47 AM     Please page through [place]:  [email or web address] Password [email or web address] If 7PM-7AM, please contact night-coverage

## 2018-04-05 NOTE — Consult Note (Signed)
Attending:     Subjective: [name] is unable to provide history because of her confusion today, history is obtained from her family.  Briefly, this is a lady who had CLL which is in remission after being cared for by Dr. Marin Olp.  She also has a past medical history significant for congestive heart failure and acquired hypogammaglobulinemia.  The patient's daughter says that ever since Christmas she has been feeling weak, increasingly short of breath and has had a lot of swelling in her legs.  She was seen in the emergency department and was given IV furosemide and then discharged home.  She followed up in her primary care physician's office where she was seen by 1 of her PCPs partners and was told to increase the dose of furosemide and lay at home with her feet elevated.  This apparently improve the leg swelling but she remained profoundly short of breath and weak.  Eventually the family brought her to the emergency room because of worsening shortness of breath.  Of note, her furosemide was held for a few days prior to admission.  Here she was admitted by the hospitalist service and was treated with IV diuretics.  Escalating doses of diuretics have been used in the last 24 hours including the addition of metolazone.  Because of worsening respiratory failure and respiratory acidosis she was started on noninvasive mechanical ventilation.  Cardiology was consulted.  An echocardiogram showed evidence of a moderate sized pericardial effusion causing early tamponade physiology.  In addition of this she has been found to have a moderate size right-sided pleural effusion.  Despite IV diuresis she has not improved and continues to struggle with shortness of breath, hypoxemia and ongoing confusion.  Multiple conversations have been held with the patient and her family today.  The patient has not been able to participate in these conversations because of confusion.  Pulmonary and critical care medicine was consulted  because of worsening respiratory failure.  Past Medical History:  Diagnosis Date  . Arthritis   . CHF exacerbation ([place]) 04/22/2017  . CLL (chronic lymphocytic leukemia) ([place]) 11/10/2012  . Hypertension   . Hypogammaglobulinemia ([place]) 04/29/2017  . Knee pain, left   . Total knee replacement status      Objective: Vitals:   04/05/18 1200 04/05/18 1300 04/05/18 1400 04/05/18 1500  BP: 113/65 120/62 (!) 105/50 (!) 129/57  Pulse: 75 69 72 71  Resp: (!) 29 14 14  (!) 22  Temp: 97.7 F (36.5 C)     TempSrc: Axillary     SpO2: (!) 89% 92% 95% 94%  Weight:          Intake/Output Summary (Last 24 hours) at 04/05/2018 1651 Last data filed at 04/05/2018 1600 Gross per 24 hour  Intake 260 ml  Output 1125 ml  Net -865 ml    General: In bed on on BIPAP HENT: NCAT OP clear PULM: Diminished bases, crackles bases, no wheezing B, increased work of breathing CV: RRR, no mgr GI: BS+, soft, nontender MSK: normal bulk and tone Neuro: Confused, pulling at BIPAP, intermittently combative    CBC    Component Value Date/Time   WBC 16.4 (H) 04/05/2018 0536   RBC 4.86 04/05/2018 0536   HGB 13.2 04/05/2018 0536   HGB 12.7 03/02/2018 0857   HGB 10.4 (L) 03/10/2017 1154   HCT 45.4 04/05/2018 0536   HCT 33.6 (L) 03/10/2017 1154   PLT 166 04/05/2018 0536   PLT 142 (L) 03/02/2018 0857   PLT 112  Large platelets present (L) 03/10/2017 1154   MCV 93.4 04/05/2018 0536   MCV 86 03/10/2017 1154   MCH 27.2 04/05/2018 0536   MCHC 29.1 (L) 04/05/2018 0536   RDW 14.5 04/05/2018 0536   RDW 18.9 (H) 03/10/2017 1154   LYMPHSABS 10.0 (H) 04/12/2018 1341   LYMPHSABS 12.1 (H) 03/10/2017 1154   MONOABS 0.9 04/07/2018 1341   EOSABS 0.0 04/02/2018 1341   EOSABS 0.1 03/10/2017 1154   BASOSABS 0.0 04/16/2018 1341   BASOSABS 0.0 03/10/2017 1154    BMET    Component Value Date/Time   NA 136 04/05/2018 0536   NA 143 03/10/2017 1154   NA 140 10/15/2016 1155   K 4.9 04/05/2018 0536   K 4.5 03/10/2017  1154   K 4.1 10/15/2016 1155   CL 99 04/05/2018 0536   CL 103 03/10/2017 1154   CO2 31 04/05/2018 0536   CO2 31 03/10/2017 1154   CO2 27 10/15/2016 1155   GLUCOSE 131 (H) 04/05/2018 0536   GLUCOSE 93 03/10/2017 1154   BUN 35 (H) 04/05/2018 0536   BUN 12 03/10/2017 1154   BUN 18.6 10/15/2016 1155   CREATININE 1.00 04/05/2018 0536   CREATININE 0.71 03/02/2018 0857   CREATININE 0.7 03/10/2017 1154   CREATININE 0.7 10/15/2016 1155   CALCIUM 9.5 04/05/2018 0536   CALCIUM 9.9 03/10/2017 1154   CALCIUM 10.2 10/15/2016 1155   GFRNONAA 51 (L) 04/05/2018 0536   GFRNONAA >60 03/02/2018 0857   GFRAA 59 (L) 04/05/2018 0536   GFRAA >60 03/02/2018 0857    CXR images reviewed showing a moderate pleural effusion and enlarged cardiac silhouette  Echo: LVEF WNL, pericardial effusion noted with some RV compression, findings worrisome for early tamponade   Impression:  Acute respiratory failure with hypoxemia and hypercarbia: Currently has signs of impending respiratory failure and is failing BiPAP Large bilateral pleural effusions and pericardial effusions of undetermined etiology: Refractory to high-dose diuretics Early tamponade CLL, presumably in remission History of congestive heart failure Acute toxic metabolic encephalopathy causing agitated delirium  Discussion: I had a lengthy conversation with the patient's husband, her son, and her daughter as well as another son on the phone.  This is a difficult situation because she currently is showing signs of impending respiratory failure and has failed conservative management with IV diuretics as well as noninvasive mechanical ventilation.  If our goal is to continue aggressive management then she would require intubation and mechanical ventilation, pericardiocentesis, and either a thoracentesis or chest tube.  However, considering the fact that she has failed conservative therapy concerns me that the etiology of these fluid collections is  something not quickly reversible like pulmonary venous hypertension.  Further, at 53 with acidemia her risk of death with the intervention of positive pressure with induction for intubation and mechanical ventilation is exceedingly high.  I talked about this in no uncertain terms with the patient's family and ultimately after conversation they feel that they would not want her to go through these procedures because of the high risk of death.  They understand that she will likely die within the next 24 to 48 hours.  They have asked that family members from out of state, as quickly as possible.  Plan: Continue IV diuretics as directed by cardiology and the [facility] team Continue noninvasive mechanical ventilation Continue anxiolysis with lorazepam as needed If dyspnea and agitation worsens would strongly consider using IV morphine  My critical care time 60 minutes  Roselie Awkward, MD [facility] PCCM  Pager: [phone] Cell: [phone] If no response, call [phone]

## 2018-04-05 NOTE — Consult Note (Addendum)
Cardiology Consultation:   Patient ID: [name] MRN: [id number]; DOB: [birth year]  Admit date: 04/25/2018 Date of Consult: 04/05/2018  Primary Care Provider: Shirline Frees, MD Primary Cardiologist: Fransico Him, MD     Patient Profile:   [name] is a 85 y.o. female with a hx of CLL, AS, hypertension, chronic diastolic congestive heart failure who is being seen today for the evaluation of acute on chronic diastolic congestive heart failure at the request of Myrene Buddy MD.  History of Present Illness:   Last echocardiogram February 2019 showed normal LV function, moderate left ventricular hypertrophy, grade 1 diastolic dysfunction, moderate aortic stenosis with mean gradient 25 mmHg, mild mitral regurgitation.  At time of evaluation patient is confused and minimally responsive.  She apparently developed increased lower extremity edema 2 weeks ago.  She was given Lasix by her primary care and her edema improved.  Her daughter thought she may be becoming dehydrated and held her Lasix.  She now has developed worsening mental status and also has complained of dyspnea and there was hypoxia by saturation monitor at home.  She was therefore admitted.  Cardiology asked to evaluate.  Past Medical History:  Diagnosis Date  . Arthritis   . CHF exacerbation ([place]) 04/22/2017  . CLL (chronic lymphocytic leukemia) ([place]) 11/10/2012  . Hypertension   . Hypogammaglobulinemia ([place]) 04/29/2017  . Knee pain, left   . Total knee replacement status     Past Surgical History:  Procedure Laterality Date  . APPENDECTOMY    . BLEPHAROPLASTY  2003  . carpel tunnel surgery     bilateral  . CHOLECYSTECTOMY    . COLON SURGERY    . HERNIA REPAIR     left  . ORIF HIP FRACTURE Left 2003   original left hip fx and ORIF 06/2001.  s/p 10/2001 Interchange and open reduction internal fixation of hip screw.  s/p 12/2001 Hardware removal left hip.  Marland Kitchen TOTAL KNEE ARTHROPLASTY Left 2008   Dr Maureen Ralphs.         Inpatient Medications: Scheduled Meds: . amLODipine  5 mg Oral Daily  . enoxaparin (LOVENOX) injection  40 mg Subcutaneous Q24H  . furosemide  80 mg Intravenous Q8H  . latanoprost  1 drop Right Eye QHS  . [START ON May 01, 2018] potassium chloride  20 mEq Oral BID   Continuous Infusions: . azithromycin    . cefTRIAXone (ROCEPHIN)  IV    . [START ON 05/01/2018] furosemide     PRN Meds: acetaminophen **OR** acetaminophen, ondansetron **OR** ondansetron (ZOFRAN) IV  Allergies:   No Known Allergies  Social History:   Social History   Socioeconomic History  . Marital status: Married    Spouse name: Not on file  . Number of children: Not on file  . Years of education: Not on file  . Highest education level: Not on file  Occupational History  . Not on file  Social Needs  . Financial resource strain: Not on file  . Food insecurity:    Worry: Not on file    Inability: Not on file  . Transportation needs:    Medical: Not on file    Non-medical: Not on file  Tobacco Use  . Smoking status: Former Smoker    Packs/day: 1.00    Years: 28.00    Pack years: 28.00    Types: Cigarettes    Start date: 02/15/1946    Last attempt to quit: 12/16/1973    Years since quitting: 44.3  . Smokeless  tobacco: Never Used  . Tobacco comment: quit 40 years ago  Substance and Sexual Activity  . Alcohol use: Yes    Alcohol/week: 0.0 standard drinks  . Drug use: Not on file  . Sexual activity: Not on file  Lifestyle  . Physical activity:    Days per week: Not on file    Minutes per session: Not on file  . Stress: Not on file  Relationships  . Social connections:    Talks on phone: Not on file    Gets together: Not on file    Attends religious service: Not on file    Active member of club or organization: Not on file    Attends meetings of clubs or organizations: Not on file    Relationship status: Not on file  . Intimate partner violence:    Fear of current or ex partner: Not on file     Emotionally abused: Not on file    Physically abused: Not on file    Forced sexual activity: Not on file  Other Topics Concern  . Not on file  Social History Narrative  . Not on file    Family History:    Family History  Problem Relation Age of Onset  . Uterine cancer Daughter      ROS:  Please see the history of present illness.  History obtained from daughter.  She apparently has not had fevers, chills, productive cough or dysuria.  She has had altered mental status. All other ROS reviewed and negative.     Physical Exam/Data:   Vitals:   04/25/2018 2223 04/24/2018 2256 04/05/18 0600 04/05/18 1100  BP: 128/61  (!) 123/56 124/79  Pulse: 81  82 79  Resp:   20   Temp:  97.6 F (36.4 C) 97.9 F (36.6 C)   TempSrc:  Oral Oral   SpO2: 90%  (!) 85% (!) 88%  Weight:        Intake/Output Summary (Last 24 hours) at 04/05/2018 1117 Last data filed at 04/05/2018 0744 Gross per 24 hour  Intake 260 ml  Output 1082 ml  Net -822 ml   Filed Weights   04/05/2018 1237  Weight: 81.6 kg   Body mass index is 36.36 kg/m.  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  S2 is not diminished. Lungs: Diminished breath sounds bases Abd: soft, nontender, no hepatomegaly  Ext: trace to 1+ edema Musculoskeletal:  No deformities, BUE and BLE strength normal and equal Skin: warm and dry  Neuro: Patient confused and has decreased level of responsiveness.  EKG:  The EKG was personally reviewed and demonstrates:  NSR, RBBB Telemetry:  Telemetry was personally reviewed and demonstrates:  Sinus  Laboratory Data:  Chemistry Recent Labs  Lab 04/05/2018 1341 04/05/18 0536  NA 138 136  K 5.2* 4.9  CL 100 99  CO2 31 31  GLUCOSE 123* 131*  BUN 33* 35*   CREATININE 1.01* 1.00  CALCIUM 9.9 9.5  GFRNONAA 50* 51*  GFRAA 58* 59*  ANIONGAP 7 6    Recent Labs  Lab 04/09/2018 1341  PROT 6.6  ALBUMIN 4.4  AST 21  ALT 21  ALKPHOS 57  BILITOT 0.8   Hematology Recent Labs  Lab 04/08/2018 1341 04/05/18 0536  WBC 15.5*  16.4*  RBC 5.47* 4.86  HGB 14.8 13.2  HCT 52.0* 45.4  MCV 95.1 93.4  MCH 27.1 27.2  MCHC 28.5* 29.1*  RDW 14.6 14.5  PLT 152 166   Recent Labs  Lab 04/03/2018 1346  TROPIPOC 0.01    BNP Recent Labs  Lab 04/14/2018 1341  BNP 110.5*     Radiology/Studies:  Dg Chest Port 1 View  Result Date: 04/05/2018 CLINICAL DATA:  Shortness of breath.  Weakness. EXAM: PORTABLE CHEST 1 VIEW COMPARISON:  03/11/2018. FINDINGS: Cardiomegaly with pulmonary venous congestion and severe bilateral interstitial prominence. Moderate bilateral pleural effusions. Findings consistent with CHF. IMPRESSION: Congestive heart failure with prominent interstitial pulmonary edema. Mild bilateral pleural effusions. Electronically Signed   By: Marcello Moores  Register   On: 04/15/2018 14:02    Assessment and Plan:   1. Acute on chronic diastolic congestive heart failure-at time of evaluation patient is hypoxic and has a respiratory acidosis.  She may need BiPAP.  I discussed CODE STATUS with patient's husband and daughter.  They apparently would like all measures at this point despite being no code previously.  I have discussed this with Dr. Loleta Books who will also review.  Patient has been placed on Lasix 120 mg IV twice daily and metolazone.  We will follow for results of this regimen.  Follow renal function.  Repeat echocardiogram.  Note BNP only minimally elevated.  Question if there is contribution to pleural effusions from other etiology. 2. History of moderate aortic stenosis-may be contributing but aortic stenosis does not sound severe on examination.  Repeat echocardiogram. 3. Hypertension-we will hold blood pressure medications for now given need for  high-dose diuretic. 4. History of CLL 5. Encephalopathy-likely related to hypoxia and elevated CO2.  Will need to follow.  For questions or updates, please contact [place] Please consult [email or web address] for contact info under     Signed, Kirk Ruths, MD  04/05/2018 11:17 AM

## 2018-04-05 NOTE — Progress Notes (Addendum)
I was called to see patient because of abnormal echocardiogram.  This was personally reviewed.  She has normal LV function, moderate aortic stenosis with mean gradient 20 mmHg.  There is a moderate to large pericardial effusion.  I do not appreciate RV diastolic collapse but the IVC is dilated and does not collapse.  Patient remains on BiPAP but more oriented.  Blood pressure 129/57 and heart rate 71.  Diminished breath sounds at bases.  Patient has received Lasix 160 mg IV and metolazone 5 mg.  She has had 300 cc of urine output only.  Follow-up ABG on BiPAP shows pH 7.26, PCO2 74, PO2 74.  Chest x-ray shows CHF and bilateral pleural effusions.  There appears to be cardiac enlargement.  Predominant issue at this point appears to be respiratory failure related to pleural effusions/CHF.  She has had minimal response to diuretics.  Her respiratory status remains tenuous despite BiPAP.  May require intubation.  I have discussed the patient with Dr. Lake Bells who will evaluate.  She may require thoracentesis.  At present she is not clinically in tamponade as she has adequate blood pressure and is not tachycardic.  There are early signs of tamponade on echocardiogram.  She may eventually require pericardiocentesis particularly given need to diurese for congestive heart failure.  Thoracentesis or pericardiocentesis may also be useful for diagnostic purposes.  Etiology of effusions is unclear.  Long discussion with patient's son, daughter and husband.  She has significant comorbidities and is 85 years old.  However they are clear that they would like acute interventions if possible which would include intubation, CPR and defibrillation.  They would also want pericardiocentesis and thoracentesis if needed.  60 min CCT (2:50-3:50 PM)  Kirk Ruths, MD

## 2018-04-05 NOTE — Progress Notes (Signed)
Patient give 80 mg IV lasix at 0830, patient still had not voided, bladder scan showing 94 cc in bladder.  Patient drowsy and not responding as normal, needing increased O2.  O2 placed 6L and sats at 88%.  MD aware and at bedside. Orders placed for ABG - critical results paged to Dr Loleta Books, orders placed to transfer to stepdown.  Family at bedside and aware.

## 2018-04-05 NOTE — Progress Notes (Signed)
Patient transferring to stepdown 1227 - report called and given to [place], [place]

## 2018-04-06 ENCOUNTER — Inpatient Hospital Stay (HOSPITAL_COMMUNITY): Payer: Medicare HMO

## 2018-04-06 ENCOUNTER — Encounter: Payer: Self-pay | Admitting: Hematology & Oncology

## 2018-04-06 ENCOUNTER — Encounter (HOSPITAL_COMMUNITY): Admission: EM | Disposition: E | Payer: Self-pay | Source: Home / Self Care | Attending: Family Medicine

## 2018-04-06 DIAGNOSIS — Z7189 Other specified counseling: Secondary | ICD-10-CM

## 2018-04-06 DIAGNOSIS — Z87891 Personal history of nicotine dependence: Secondary | ICD-10-CM

## 2018-04-06 DIAGNOSIS — I313 Pericardial effusion (noninflammatory): Secondary | ICD-10-CM

## 2018-04-06 DIAGNOSIS — R0602 Shortness of breath: Secondary | ICD-10-CM

## 2018-04-06 DIAGNOSIS — I110 Hypertensive heart disease with heart failure: Principal | ICD-10-CM

## 2018-04-06 DIAGNOSIS — R531 Weakness: Secondary | ICD-10-CM

## 2018-04-06 DIAGNOSIS — I3139 Other pericardial effusion (noninflammatory): Secondary | ICD-10-CM

## 2018-04-06 DIAGNOSIS — Z515 Encounter for palliative care: Secondary | ICD-10-CM

## 2018-04-06 LAB — BLOOD GAS, ARTERIAL
Acid-Base Excess: 4.3 mmol/L — ABNORMAL HIGH (ref 0.0–2.0)
Bicarbonate: 28.3 mmol/L — ABNORMAL HIGH (ref 20.0–28.0)
DRAWN BY: 295031
FIO2: 100
MECHVT: 420 mL
O2 Saturation: 99.8 %
PEEP/CPAP: 5 cmH2O
PO2 ART: 187 mmHg — AB (ref 83.0–108.0)
Patient temperature: 37
RATE: 22 resp/min
pCO2 arterial: 41.6 mmHg (ref 32.0–48.0)
pH, Arterial: 7.447 (ref 7.350–7.450)

## 2018-04-06 LAB — BASIC METABOLIC PANEL
Anion gap: 7 (ref 5–15)
BUN: 38 mg/dL — ABNORMAL HIGH (ref 8–23)
CHLORIDE: 99 mmol/L (ref 98–111)
CO2: 32 mmol/L (ref 22–32)
Calcium: 9.3 mg/dL (ref 8.9–10.3)
Creatinine, Ser: 1.27 mg/dL — ABNORMAL HIGH (ref 0.44–1.00)
GFR calc Af Amer: 44 mL/min — ABNORMAL LOW (ref >60)
GFR calc non Af Amer: 38 mL/min — ABNORMAL LOW (ref >60)
Glucose, Bld: 98 mg/dL (ref 70–99)
Potassium: 4.8 mmol/L (ref 3.5–5.1)
Sodium: 138 mmol/L (ref 135–145)

## 2018-04-06 LAB — PROCALCITONIN: Procalcitonin: <0.1 ng/mL

## 2018-04-06 SURGERY — PERICARDIOCENTESIS
Anesthesia: LOCAL

## 2018-04-06 MED ORDER — POLYVINYL ALCOHOL 1.4 % OP SOLN: 1.0000 [drp] | Freq: Four times a day (QID) | OPHTHALMIC | Status: DC | PRN

## 2018-04-06 MED ORDER — FENTANYL CITRATE (PF) 100 MCG/2ML IJ SOLN
50.0000 ug | Freq: Once | INTRAMUSCULAR | Status: AC
  Administered 2018-04-06: 50 ug via INTRAVENOUS

## 2018-04-06 MED ORDER — DOCUSATE SODIUM 50 MG/5ML PO LIQD: 100.0000 mg | Freq: Two times a day (BID) | ORAL | Status: DC | PRN

## 2018-04-06 MED ORDER — ORAL CARE MOUTH RINSE
15.0000 mL | Freq: Two times a day (BID) | OROMUCOSAL | Status: DC
  Administered 2018-04-06: 15 mL via OROMUCOSAL

## 2018-04-06 MED ORDER — IPRATROPIUM BROMIDE 0.02 % IN SOLN: 0.5000 mg | RESPIRATORY_TRACT | Status: DC | PRN

## 2018-04-06 MED ORDER — MORPHINE SULFATE (PF) 2 MG/ML IV SOLN: 2.0000 mg | INTRAVENOUS | Status: DC | PRN

## 2018-04-06 MED ORDER — FENTANYL BOLUS VIA INFUSION
25.0000 ug | INTRAVENOUS | Status: DC | PRN
  Filled 2018-04-06: qty 25

## 2018-04-06 MED ORDER — MIDAZOLAM HCL 2 MG/2ML IJ SOLN
1.0000 mg | INTRAMUSCULAR | Status: DC | PRN
  Filled 2018-04-06: qty 2

## 2018-04-06 MED ORDER — DIPHENHYDRAMINE HCL 50 MG/ML IJ SOLN: 25.0000 mg | INTRAMUSCULAR | Status: DC | PRN

## 2018-04-06 MED ORDER — CHLORHEXIDINE GLUCONATE 0.12% ORAL RINSE (MEDLINE KIT)
15.0000 mL | Freq: Two times a day (BID) | OROMUCOSAL | Status: DC
  Administered 2018-04-06: 15 mL via OROMUCOSAL

## 2018-04-06 MED ORDER — SODIUM CHLORIDE 0.9 % IV SOLN: INTRAVENOUS | Status: DC | PRN

## 2018-04-06 MED ORDER — GLYCOPYRROLATE 1 MG PO TABS: 1.0000 mg | ORAL_TABLET | ORAL | Status: DC | PRN

## 2018-04-06 MED ORDER — MIDAZOLAM HCL 2 MG/2ML IJ SOLN
INTRAMUSCULAR | Status: AC
  Administered 2018-04-06: 2 mg
  Filled 2018-04-06: qty 2

## 2018-04-06 MED ORDER — FENTANYL 2500MCG IN NS 250ML (10MCG/ML) PREMIX INFUSION
25.0000 ug/h | INTRAVENOUS | Status: DC
  Administered 2018-04-06: 25 ug/h via INTRAVENOUS
  Filled 2018-04-06: qty 250

## 2018-04-06 MED ORDER — MORPHINE 100MG IN NS 100ML (1MG/ML) PREMIX INFUSION
0.0000 mg/h | INTRAVENOUS | Status: DC
  Administered 2018-04-06: 5 mg/h via INTRAVENOUS
  Filled 2018-04-06: qty 100

## 2018-04-06 MED ORDER — ORAL CARE MOUTH RINSE: 15.0000 mL | OROMUCOSAL | Status: DC

## 2018-04-06 MED ORDER — CHLORHEXIDINE GLUCONATE 0.12 % MT SOLN
15.0000 mL | Freq: Two times a day (BID) | OROMUCOSAL | Status: DC
  Administered 2018-04-06: 15 mL via OROMUCOSAL
  Filled 2018-04-06: qty 15

## 2018-04-06 MED ORDER — MORPHINE BOLUS VIA INFUSION
5.0000 mg | INTRAVENOUS | Status: DC | PRN
  Filled 2018-04-06: qty 5

## 2018-04-06 MED ORDER — SODIUM CHLORIDE 0.9 % IV BOLUS
250.0000 mL | Freq: Once | INTRAVENOUS | Status: AC
  Administered 2018-04-06: 250 mL via INTRAVENOUS

## 2018-04-06 MED ORDER — FENTANYL CITRATE (PF) 100 MCG/2ML IJ SOLN
INTRAMUSCULAR | Status: AC
  Administered 2018-04-06: 50 ug
  Filled 2018-04-06: qty 2

## 2018-04-06 MED ORDER — ACETAMINOPHEN 650 MG RE SUPP: 650.0000 mg | Freq: Four times a day (QID) | RECTAL | Status: DC | PRN

## 2018-04-06 MED ORDER — ALBUTEROL SULFATE (2.5 MG/3ML) 0.083% IN NEBU: 2.5000 mg | INHALATION_SOLUTION | RESPIRATORY_TRACT | Status: DC | PRN

## 2018-04-06 MED ORDER — GLYCOPYRROLATE 0.2 MG/ML IJ SOLN: 0.2000 mg | INTRAMUSCULAR | Status: DC | PRN

## 2018-04-06 MED ORDER — MIDAZOLAM HCL 2 MG/2ML IJ SOLN
1.0000 mg | INTRAMUSCULAR | Status: AC | PRN
  Administered 2018-04-06 (×3): 1 mg via INTRAVENOUS
  Filled 2018-04-06 (×2): qty 2

## 2018-04-06 MED ORDER — DEXTROSE 5 % IV SOLN: INTRAVENOUS | Status: DC

## 2018-04-06 MED ORDER — METHYLPREDNISOLONE SODIUM SUCC 125 MG IJ SOLR: 60.0000 mg | Freq: Four times a day (QID) | INTRAMUSCULAR | Status: DC

## 2018-04-06 MED ORDER — ACETAMINOPHEN 325 MG PO TABS: 650.0000 mg | ORAL_TABLET | Freq: Four times a day (QID) | ORAL | Status: DC | PRN

## 2018-04-10 LAB — CULTURE, BLOOD (ROUTINE X 2)
Culture: NO GROWTH
Culture: NO GROWTH
SPECIAL REQUESTS: ADEQUATE
SPECIAL REQUESTS: ADEQUATE

## 2018-04-11 ENCOUNTER — Telehealth: Payer: Self-pay | Admitting: Pulmonary Disease

## 2018-04-11 NOTE — Telephone Encounter (Signed)
Yes. That was a mistake. The manner of death should be natural, not homicide.  Please check with them and medical records. If they want to send a fresh certificate I can fill out or write a letter tomorrow when in office

## 2018-04-11 NOTE — Telephone Encounter (Signed)
Called and spoke with Fransisco Beau from [facility], she stated that in the patients death certificate Dr. Vaughan Browner checked the box that stated patient died of homicide. Fransisco Beau wanted to make sure that it was correct since the documentation shows different. If this was a mistake she will need a statement on our office overhead stating that this was incorrect and what the patient correctly passed from. Dr. Vaughan Browner please advise, thank you.

## 2018-04-11 NOTE — Telephone Encounter (Signed)
Ok. Dr. Vaughan Browner, I wrote a letter on letterhead and it is pending in Epic. If that letter is ok, just print and sign it. Thanks.

## 2018-04-12 NOTE — Telephone Encounter (Signed)
Yes. Ok to print and sign

## 2018-04-12 NOTE — Telephone Encounter (Signed)
Called and spoke with [name].  She stated that signed letter can be faxed to her at [phone].  Letter printed and signed by Dr. Vaughan Browner. Letter faxed and confirmation received.  Nothing further at this time.

## 2018-04-13 ENCOUNTER — Ambulatory Visit: Payer: Medicare HMO | Admitting: Hematology & Oncology

## 2018-04-13 ENCOUNTER — Other Ambulatory Visit: Payer: Medicare HMO

## 2018-04-13 NOTE — Telephone Encounter (Signed)
I am unable to close this for some reason  Dr Vaughan Browner, can you close it? Thanks

## 2018-04-18 NOTE — Telephone Encounter (Signed)
Will route over to Dr. Vaughan Browner, waiting on documentation to close encounter.

## 2018-04-29 NOTE — Discharge Summary (Signed)
Physician Death Summary  Patient ID: [name] MRN: [id number] DOB/AGE: 1928/08/04 85 y.o.  Admit date: 04/09/2018 Discharge date: 2018/04/30  Admission Diagnoses: Dyspnea  Discharge Diagnoses:  Acute hypoxic, hypercarbic respiratory failure Acute on chronic CHF Bilateral pleural effusions Pericardial effusion with early tamponade History of chronic CLL Chronic hypertension Anemia  Discharged Condition: Deceased  Hospital Course:  85 yo F with PMH CHF, CLL, HTN who presented to ED 1/7 with dyspnea and weakness. The patient began feeling short of breath approximately 2 weeks ago, with progressive shortness of breath and edema prompting presentation to OSH ED for diuresis and subsequent visit to PCP for increase in lasix dose. Edema persisted and dyspnea progressed. Home Lasix held x2 days as it was not felt to improve pedal edema. Dyspnea continued to worsen with increasing home O2 requirement and patient then presented to [facility] ED.   Since admission, patient has been seen by [facility] and cardiology for heart failure. Her respiratory status has not improved despite diuresis and BiPAP. PCCM has been consulted.  Cardiology was also involved for evaluation of echocardiogram showing moderate to large pericardial effusion with early tamponade.  Family discussion held.  Initial goal was palliation however family changed her mind and requested full scope of care on 1/10  Patient was intubated for worsening respiratory failure. Central line placed for perisistent hypotension post intubation and CT scan done which again demonstrated moderate pleural effusions with associated atelectasis.  There is no clear evidence of pneumonia or consolidation. Patient has been significantly agitated and in discomfort on vent requiring increasing doses of benzodiazepines and fentanyl drip. Bedside ultrasound did not show significant pockets of pleural effusion to do a thoracentesis safely.   Discussed with Dr.  Stanford Breed, Cardiology and decision made to transfer to [facility] for pericardiocentesis However during the preparation for transfer family decided they do not want to transfer to [facility] or get any more procedures. They requested that we stop all interventions and transition to comfort care. Orders placed for morphine drip and terminal extubation from ventilator.    Consults: Palliative care, cardiology, pulmonary critical care, oncology  Signed: Rylin Saez 04/07/2018, 9:16 AM

## 2018-04-29 NOTE — Procedures (Signed)
Extubation Procedure Note  Patient Details:   Name: [name] DOB: 1928/07/30 MRN: [id number]   Airway Documentation:    Vent end date: 2018/04/11 Vent end time: 1730   Evaluation  O2 sats: N/A Complications: none Patient tolerated procedure well. Bilateral Breath Sounds: Diminished   Terminal wean, pt unable to speak  Martha Clan 04-11-2018, 5:49 PM

## 2018-04-29 NOTE — Consult Note (Signed)
Referral MD  Reason for Referral: CLL; pericardial effusion; respiratory difficulty  Chief Complaint  Patient presents with  . Shortness of Breath  . Weakness  : Patient cannot give me any history.  HPI: [name] is well-known to me.  She is a 85 year old white female.  I have known her for over 10 years.  She has a history of CLL.  So far, her disease has been relatively "quiet.".  She has not been on any therapy for almost a year.  I last saw her about a month ago.  At that time, her white cell count was up a little bit.  Her white cell count was 11.2.  She is not anemic or thrombocytopenic.  She was totally asymptomatic.  I do not feel that we had to intervene with any therapy.  She apparently was admitted a couple days ago.  She is having respiratory difficulty.  It is felt that she is having issues with congestive heart failure.  She is worsened.  She had to be moved out of the ICU yesterday.  She had an echocardiogram done.  She has a large pericardial effusion.  She has mild bilateral pleural effusions.  Her echocardiogram showed that she had an ejection fraction of 55-60%.  She had moderate aortic stenosis.  Her CBC yesterday showed a white cell count of 16.4.  Hemoglobin 13.2 and platelet count 166,000.  She has been seen by cardiology.  She has been seen by medical care.  She is on BiPAP right now.  There is concerned that she may need a pericardiocentesis.  This is to see what the etiology of the fluid around the heart is.  She has not had a CT scan.  I think it would be hard to do a proper CT scan with contrast because of her renal function.  She really does not have fluid in her legs.  This is a good sign.  The issue was what is causing the pericardial effusion.  She really cannot give me any history as she has been recently sedated.  She has 2 of her children with her.  The family clearly favors an aggressive approach.  They feel that a pericardiocentesis would be  reasonable to try to find out what is causing the fluid.  I think would be highly unusual for the CLL to be causing the pericardial effusion.  However, this cannot be totally ruled out.  It would be nice to do a CT scan of the chest but I would have to have contrast done to see if there is adenopathy in the chest that might indicate that her CLL is active or might be transforming to lymphoma.  Since I have known her for such a long time, I think she would clearly be in favor of doing a pericardiocentesis.  I do think this would be a reasonable procedure.  I understand that there could be the possibility that she needs to be intubated afterwards if she is having respiratory issues.  I believe that [name] would take the risk of being intubated if the pericardial fluid could be taken off and sent for analysis.  On her physical exam, I really cannot appreciate any obvious adenopathy.   Past Medical History:  Diagnosis Date  . Arthritis   . CHF exacerbation ([place]) 04/22/2017  . CLL (chronic lymphocytic leukemia) ([place]) 11/10/2012  . Hypertension   . Hypogammaglobulinemia ([place]) 04/29/2017  . Knee pain, left   . Total knee replacement status   :  Past Surgical History:  Procedure Laterality Date  . APPENDECTOMY    . BLEPHAROPLASTY  2003  . carpel tunnel surgery     bilateral  . CHOLECYSTECTOMY    . COLON SURGERY    . HERNIA REPAIR     left  . ORIF HIP FRACTURE Left 2003   original left hip fx and ORIF 06/2001.  s/p 10/2001 Interchange and open reduction internal fixation of hip screw.  s/p 12/2001 Hardware removal left hip.  Marland Kitchen TOTAL KNEE ARTHROPLASTY Left 2008   Dr Maureen Ralphs.    :   Current Facility-Administered Medications:  .  acetaminophen (TYLENOL) tablet 650 mg, 650 mg, Oral, Q6H PRN, 650 mg at 04/05/18 0418 **OR** acetaminophen (TYLENOL) suppository 650 mg, 650 mg, Rectal, Q6H PRN, Danford, Suann Larry, MD .  azithromycin (ZITHROMAX) 500 mg in sodium chloride 0.9 % 250 mL IVPB, 500  mg, Intravenous, Q24H, Danford, Suann Larry, MD, Stopped at 04/05/18 1304 .  cefTRIAXone (ROCEPHIN) 1 g in sodium chloride 0.9 % 100 mL IVPB, 1 g, Intravenous, Q24H, Danford, Suann Larry, MD, Stopped at 04/05/18 1445 .  enoxaparin (LOVENOX) injection 40 mg, 40 mg, Subcutaneous, Q24H, Danford, Suann Larry, MD, 40 mg at 04/05/18 2137 .  furosemide (LASIX) 120 mg in dextrose 5 % 50 mL IVPB, 120 mg, Intravenous, BID, Danford, Christopher P, MD .  latanoprost (XALATAN) 0.005 % ophthalmic solution 1 drop, 1 drop, Right Eye, QHS, Danford, Suann Larry, MD, 1 drop at 04/05/18 2205 .  LORazepam (ATIVAN) injection 1 mg, 1 mg, Intravenous, Q4H PRN, Blount, Xenia T, NP, 1 mg at 2018/04/24 0455 .  morphine 2 MG/ML injection 1-4 mg, 1-4 mg, Intravenous, Q2H PRN, Danford, Christopher P, MD .  ondansetron (ZOFRAN) tablet 4 mg, 4 mg, Oral, Q6H PRN **OR** ondansetron (ZOFRAN) injection 4 mg, 4 mg, Intravenous, Q6H PRN, Danford, Christopher P, MD .  pantoprazole (PROTONIX) injection 40 mg, 40 mg, Intravenous, Q24H, Bowser, Grace E, NP, 40 mg at 04/05/18 1741 .  potassium chloride SA (K-DUR,KLOR-CON) CR tablet 20 mEq, 20 mEq, Oral, BID, Danford, Christopher P, MD:  . enoxaparin (LOVENOX) injection  40 mg Subcutaneous Q24H  . latanoprost  1 drop Right Eye QHS  . pantoprazole (PROTONIX) IV  40 mg Intravenous Q24H  . potassium chloride  20 mEq Oral BID  :  No Known Allergies:  Family History  Problem Relation Age of Onset  . Uterine cancer Daughter   :  Social History   Socioeconomic History  . Marital status: Married    Spouse name: Not on file  . Number of children: Not on file  . Years of education: Not on file  . Highest education level: Not on file  Occupational History  . Not on file  Social Needs  . Financial resource strain: Not on file  . Food insecurity:    Worry: Not on file    Inability: Not on file  . Transportation needs:    Medical: Not on file    Non-medical: Not on file   Tobacco Use  . Smoking status: Former Smoker    Packs/day: 1.00    Years: 28.00    Pack years: 28.00    Types: Cigarettes    Start date: 02/15/1946    Last attempt to quit: 12/16/1973    Years since quitting: 44.3  . Smokeless tobacco: Never Used  . Tobacco comment: quit 40 years ago  Substance and Sexual Activity  . Alcohol use: Yes    Alcohol/week: 0.0 standard drinks  .  Drug use: Not on file  . Sexual activity: Not on file  Lifestyle  . Physical activity:    Days per week: Not on file    Minutes per session: Not on file  . Stress: Not on file  Relationships  . Social connections:    Talks on phone: Not on file    Gets together: Not on file    Attends religious service: Not on file    Active member of club or organization: Not on file    Attends meetings of clubs or organizations: Not on file    Relationship status: Not on file  . Intimate partner violence:    Fear of current or ex partner: Not on file    Emotionally abused: Not on file    Physically abused: Not on file    Forced sexual activity: Not on file  Other Topics Concern  . Not on file  Social History Narrative  . Not on file  :  Pertinent items are noted in HPI.  Exam: As above Patient Vitals for the past 24 hrs:  BP Temp Temp src Pulse Resp SpO2 Height Weight  05-03-18 0615 (!) 102/47 - - 84 17 94 % - -  May 03, 2018 0600 (!) 94/47 - - 85 15 94 % - -  03-May-2018 0500 (!) 125/52 - - 81 20 94 % - -  03-May-2018 0436 - - Oral - - - 5\' 3"  (1.6 m) -  03-May-2018 0433 - - - - - - - 181 lb 3.5 oz (82.2 kg)  May 03, 2018 0400 (!) 102/51 - - 75 15 93 % - -  03-May-2018 0336 (!) 106/51 - - 75 14 93 % - -  May 03, 2018 0300 (!) 106/51 - - 76 16 94 % - -  May 03, 2018 0200 (!) 128/51 - - 78 18 94 % - -  May 03, 2018 0100 (!) 110/50 - - 77 14 94 % - -  05/03/18 0000 132/69 - - 77 18 94 % - -  04/05/18 2338 - 98.6 F (37 C) Axillary - - - - -  04/05/18 2304 (!) 113/53 - - 73 14 95 % - -  04/05/18 2200 (!) 109/48 - - 74 12 95 % - -   04/05/18 2100 (!) 98/51 - - 69 12 94 % - -  04/05/18 1959 134/74 - - 73 14 91 % - -  04/05/18 1938 - 98 F (36.7 C) Axillary - - - - -  04/05/18 1600 - (!) 97.4 F (36.3 C) Axillary - - - - -  04/05/18 1500 (!) 129/57 - - 71 (!) 22 94 % - -  04/05/18 1400 (!) 105/50 - - 72 14 95 % - -  04/05/18 1300 120/62 - - 69 14 92 % - -  04/05/18 1200 113/65 97.7 F (36.5 C) Axillary 75 (!) 29 (!) 89 % - -  04/05/18 1100 124/79 - - 79 - (!) 88 % - -     Recent Labs    04/16/2018 1341 04/05/18 0536  WBC 15.5* 16.4*  HGB 14.8 13.2  HCT 52.0* 45.4  PLT 152 166   Recent Labs    04/05/18 0536 05/03/18 0254  NA 136 138  K 4.9 4.8  CL 99 99  CO2 31 32  GLUCOSE 131* 98  BUN 35* 38*  CREATININE 1.00 1.27*  CALCIUM 9.5 9.3    Blood smear review: None  Pathology: None    Assessment and Plan: [name] is a very nice  85 year old white female.  She has a very long history of CLL.  Again she not been on treatment for a year.  Her disease really has not been aggressive.  Lately, her white cell count has gone up a little bit.  She now is admitted with probable cardiac issues.  She has this pericardial effusion.  The etiology is not clear.  Again, I believe that it would be worthwhile doing a pericardiocentesis on her.  We really have to figure out what the source of the pericardial effusion is.  It could be that this is from the CLL.  Again this would be highly unusual.  I would think that we would be seen evidence of progression elsewhere.  The other possibility is that her CLL is transforming to lymphoma.  Again knowing [name] as well as I do, I think that she would be willing to take the risk of being intubated and potentially not coming off of ventilator in order to find out the reason that she has this pericardial effusion.  I know this is a very tricky situation.  I realize her age is certainly not in her favor.  We will follow along and try to help out any way that we  can.  Lattie Haw, MD  Jeneen Rinks 1:5-7

## 2018-04-29 NOTE — Progress Notes (Addendum)
Progress Note  Patient Name: [name] Date of Encounter: 2018/04/07  Primary Cardiologist: Fransico Him, MD   Subjective   On Bipap; unresponsive  Inpatient Medications    Scheduled Meds: . chlorhexidine  15 mL Mouth Rinse BID  . enoxaparin (LOVENOX) injection  40 mg Subcutaneous Q24H  . latanoprost  1 drop Right Eye QHS  . mouth rinse  15 mL Mouth Rinse q12n4p  . pantoprazole (PROTONIX) IV  40 mg Intravenous Q24H  . potassium chloride  20 mEq Oral BID   Continuous Infusions: . azithromycin Stopped (04/05/18 1304)  . cefTRIAXone (ROCEPHIN)  IV 1 g (April 07, 2018 1012)  . furosemide 120 mg (2018-04-07 0940)   PRN Meds: acetaminophen **OR** acetaminophen, LORazepam, morphine injection, ondansetron **OR** ondansetron (ZOFRAN) IV   Vital Signs    Vitals:   April 07, 2018 0615 2018/04/07 0737 07-Apr-2018 0800 04/07/18 1000  BP: (!) 102/47 (!) 107/54 121/62 (!) 132/42  Pulse: 84 79 81 79  Resp: 17 18 20  (!) 21  Temp:   99.2 F (37.3 C)   TempSrc:   Axillary   SpO2: 94% 95% 95% 96%  Weight:      Height:        Intake/Output Summary (Last 24 hours) at 04-07-18 1113 Last data filed at 04/07/2018 0600 Gross per 24 hour  Intake 314.89 ml  Output 600 ml  Net -285.11 ml   Filed Weights   04/08/2018 1237 07-Apr-2018 0433  Weight: 81.6 kg 82.2 kg    Telemetry    Sinus- Personally Reviewed   Physical Exam   GEN: On Bipap; unresponsive Cardiac: RRR Respiratory: Diminished BS GI: Soft, ND MS: Trace edema Neuro: unresponsive  Labs    Chemistry Recent Labs  Lab 04/20/2018 1341 04/05/18 0536 April 07, 2018 0254  NA 138 136 138  K 5.2* 4.9 4.8  CL 100 99 99  CO2 31 31 32  GLUCOSE 123* 131* 98  BUN 33* 35* 38*  CREATININE 1.01* 1.00 1.27*  CALCIUM 9.9 9.5 9.3  PROT 6.6  --   --   ALBUMIN 4.4  --   --   AST 21  --   --   ALT 21  --   --   ALKPHOS 57  --   --   BILITOT 0.8  --   --   GFRNONAA 50* 51* 38*  GFRAA 58* 59* 44*  ANIONGAP 7 6 7      Hematology Recent Labs    Lab 04/17/2018 1341 04/05/18 0536  WBC 15.5* 16.4*  RBC 5.47* 4.86  HGB 14.8 13.2  HCT 52.0* 45.4  MCV 95.1 93.4  MCH 27.1 27.2  MCHC 28.5* 29.1*  RDW 14.6 14.5  PLT 152 166    Recent Labs  Lab 04/12/2018 1346  TROPIPOC 0.01     BNP Recent Labs  Lab 04/26/2018 1341  BNP 110.5*       Radiology    Dg Chest Port 1 View  Result Date: 04/07/2018 CLINICAL DATA:  Shortness of breath.  Weakness. EXAM: PORTABLE CHEST 1 VIEW COMPARISON:  03/11/2018. FINDINGS: Cardiomegaly with pulmonary venous congestion and severe bilateral interstitial prominence. Moderate bilateral pleural effusions. Findings consistent with CHF. IMPRESSION: Congestive heart failure with prominent interstitial pulmonary edema. Mild bilateral pleural effusions. Electronically Signed   By: Marcello Moores  Register   On: 04/23/2018 14:02    Patient Profile     85 y.o. female with past medical history of CLL, aortic stenosis, hypertension, chronic diastolic congestive heart failure admitted with acute respiratory failure  for evaluation of acute on chronic diastolic congestive heart failure and pericardial effusion.  Echocardiogram showed normal LV function, mild left ventricular hypertrophy, mild diastolic dysfunction, moderate aortic stenosis with mean gradient 20 mmHg and moderate to large pericardial effusion with early tamponade.  Assessment & Plan    1 respiratory failure-etiology unclear.  Chest x-ray shows pleural effusions and edema.  Initial concern was acute on chronic diastolic congestive heart failure.  She did not respond well to diuretics and initial BNP was 110.  This raises concern that effusions may be from a different etiology.  Dr. Lake Bells spoke with the family at length last evening and the family did not want to pursue thoracentesis or pericardiocentesis.  They have changed their mind today.  They would like aggressive measures.  I have explained at length that given her age and presentation that her prognosis  is tentative.  It is not clear to me at present that she is in [place] as she has a normal blood pressure and heart rate.  I have discussed patient with Dr Vaughan Browner.  He has ultrasounded patient and pleural effusion does not appear to be big enough to perform thoracentesis.  He plans chest CT.  If no other etiology is identified will proceed with pericardiocentesis.  I have discussed the risk with the family.  As outlined they would like all measures at this point with the exception of full code in the event of cardiac arrest.  Will hold on further diuresis given question of tamponade.  2 moderate aortic stenosis-unchanged on follow-up echocardiogram.  3 encephalopathy-felt likely secondary to hypoxia and elevated CO2.  4 history of CLL  CRITICAL CARE Performed by: Kirk Ruths   Total critical care time: 45 minutes  Critical care time was exclusive of separately billable procedures and treating other patients.  Critical care was necessary to treat or prevent imminent or life-threatening deterioration.  Critical care was time spent personally by me on the following activities: development of treatment plan with patient and/or surrogate as well as nursing, discussions with consultants, evaluation of patient's response to treatment, examination of patient, obtaining history from patient or surrogate, ordering and performing treatments and interventions, ordering and review of laboratory studies, ordering and review of radiographic studies, pulse oximetry and re-evaluation of patient's condition.   For questions or updates, please contact [place] Please consult [email or web address] for contact info under        Signed, Kirk Ruths, MD  2018-05-01, 11:13 AM

## 2018-04-29 NOTE — Procedures (Signed)
Central Venous Catheter Insertion Procedure Note Bernisha Verma [place] [id number] [birth year]  Procedure: Insertion of Central Venous Catheter Indications: Drug and/or fluid administration and hypotension  Procedure Details Consent: Risks of procedure as well as the alternatives and risks of each were explained to the (patient/caregiver).  Consent for procedure obtained. Time Out: Verified patient identification, verified procedure, site/side was marked, verified correct patient position, special equipment/implants available, medications/allergies/relevent history reviewed, required imaging and test results available.  Performed  Maximum sterile technique was used including antiseptics, cap, gloves, gown, hand hygiene, mask and sheet. Skin prep: Chlorhexidine; local anesthetic administered A antimicrobial bonded/coated triple lumen catheter was placed in the left internal jugular vein using the Seldinger technique.  Evaluation Blood flow good Complications: No apparent complications Patient did tolerate procedure well. Chest X-ray ordered to verify placement.  CXR: pending.  [place] Apr 08, 2018, 4:01 PM

## 2018-04-29 NOTE — Accreditation Note (Signed)
o Restraints not reported to CMS Pursuant to regulation 482.13 (G) (3) use of soft wrist restraints was logged on 04/07/2018 @ 1516.

## 2018-04-29 NOTE — Consult Note (Signed)
Consultation Note Date: April 21, 2018   Patient Name: [name]  DOB: 01-31-1929  MRN: [id number]  Age / Sex: 85 y.o., female  PCP: Shirline Frees, MD Referring Physician: Georgette Shell, MD  Reason for Consultation: Establishing goals of care  HPI/Patient Profile: 85 y.o. female  admitted on 04/19/2018    Clinical Assessment and Goals of Care:   Patient is an 85 year old lady with a history of CLL for which he follows with Dr. Lutricia Feil. Patient was admitted with shortness of breath and was diagnosed with congestive heart failure exacerbation. Patient's echocardiogram showed large pericardial effusion. For the past 24 hours she has been BiPAP dependent.  The patient has been seen and evaluated by pulmonary critical care medicine, cardiology as well as hematology oncology.  Family present at the bedside states that for the last 2 weeks the patient has been complaining of chest tightness, shortness of breath and a feeling of not getting enough air. She was on home oxygen that was increased to 4 L nasal cannula premature 24 7 basis. She lives at home with her husband and her daughter moved in recently to help. Another son lives locally. Patient's baseline is such that she was able to be up and about, she would use a walker but she was able to perform most all activities of daily living.  A palliative consultation has been requested for goals of care discussions.  Patient seen and examined. I met with patient's husband and son present at the bedside. I introduced myself and palliative care as follows:  Palliative medicine is specialized medical care for people living with serious illness. It focuses on providing relief from the symptoms and stress of a serious illness. The goal is to improve quality of life for both the patient and the family.  The patient's family is appreciative of all of the information  they have received from various specialists. They understand that the patient has a high risk for ending up needing endotracheal intubation, thoracenteses, pericardiocentesis.  Initially, they had decided against invasive procedures. However, as of early this morning on April 21, 2018, the family tells me as well as the [facility] M.D. that they would wish to proceed with procedures, fully understanding the risks. 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 They state that she has overcome several challenging situations earlier. They state that she survived colon cancer and a ruptured appendix surgery several years ago. Not doing procedures and continuing with BiPAP and judicious use of opioids/benzodiazepines, comfort measures is and not in line with who she is as a person according to family present at the bedside.  Please note additional discussions/summary of recommendations as listed below. Thank you for the consult.  NEXT OF KIN  husband, son. Patient has 4 living children, she had 5 kids, her daughter died 6 months ago. Son present at the bedside states that this took a heavy toll on the patient.   SUMMARY OF RECOMMENDATIONS    Goals of care discussions undertaken with the patient's husband and son present  at the bedside. Life review performed. Discussed about patient's current as well as underlying medical conditions.   Input and recommendations from cardiology, pulmonary critical care medicine, hematology discussed in detail again, with the family.   Goals wishes and values attempted to be elicited. Patient's son and husband report that, after careful consideration, the family elects for an aggressive approach at this time. This includes pericardiocentesis, proceeding with intubation and mechanical ventilation if necessary.   Should the patient become ventilator dependent for an extended period of time with no chance of meaningful recovery, at that time, they will consider discontinuation of  mechanical ventilatory support.   At present, the goals are not comfort focused only.  I have discussed this with [facility] M.D. She has begun coordinating care with having the patient transferred to [facility], possibly 2- heart or ICU level of care.  Thank you for the consult.  Code Status/Advance Care Planning:  Full code    Symptom Management:    she is on Ativan PRN.   Palliative Prophylaxis:   Delirium Protocol  Additional Recommendations (Limitations, Scope, Preferences):  Full Scope Treatment  Psycho-social/Spiritual:   Desire for further Chaplaincy support:yes  Additional Recommendations: Caregiving  Support/Resources  Prognosis:   Unable to determine  Discharge Planning: To Be Determined      Primary Diagnoses: Present on Admission: . CLL (chronic lymphocytic leukemia) ([place]) . Chronic hypertension . Anemia . Acute respiratory failure with hypoxia ([place])   I have reviewed the medical record, interviewed the patient and family, and examined the patient. The following aspects are pertinent.  Past Medical History:  Diagnosis Date  . Arthritis   . CHF exacerbation ([place]) 04/22/2017  . CLL (chronic lymphocytic leukemia) ([place]) 11/10/2012  . Hypertension   . Hypogammaglobulinemia ([place]) 04/29/2017  . Knee pain, left   . Total knee replacement status    Social History   Socioeconomic History  . Marital status: Married    Spouse name: Not on file  . Number of children: Not on file  . Years of education: Not on file  . Highest education level: Not on file  Occupational History  . Not on file  Social Needs  . Financial resource strain: Not on file  . Food insecurity:    Worry: Not on file    Inability: Not on file  . Transportation needs:    Medical: Not on file    Non-medical: Not on file  Tobacco Use  . Smoking status: Former Smoker    Packs/day: 1.00    Years: 28.00    Pack years: 28.00    Types: Cigarettes    Start date: 02/15/1946     Last attempt to quit: 12/16/1973    Years since quitting: 44.3  . Smokeless tobacco: Never Used  . Tobacco comment: quit 40 years ago  Substance and Sexual Activity  . Alcohol use: Yes    Alcohol/week: 0.0 standard drinks  . Drug use: Not on file  . Sexual activity: Not on file  Lifestyle  . Physical activity:    Days per week: Not on file    Minutes per session: Not on file  . Stress: Not on file  Relationships  . Social connections:    Talks on phone: Not on file    Gets together: Not on file    Attends religious service: Not on file    Active member of club or organization: Not on file    Attends meetings of clubs or organizations: Not on  file    Relationship status: Not on file  Other Topics Concern  . Not on file  Social History Narrative  . Not on file   Family History  Problem Relation Age of Onset  . Uterine cancer Daughter    Scheduled Meds: . chlorhexidine  15 mL Mouth Rinse BID  . enoxaparin (LOVENOX) injection  40 mg Subcutaneous Q24H  . latanoprost  1 drop Right Eye QHS  . mouth rinse  15 mL Mouth Rinse q12n4p  . pantoprazole (PROTONIX) IV  40 mg Intravenous Q24H  . potassium chloride  20 mEq Oral BID   Continuous Infusions: . azithromycin Stopped (04/05/18 1304)  . cefTRIAXone (ROCEPHIN)  IV Stopped (04/05/18 1445)  . furosemide 120 mg (2018-04-24 0940)   PRN Meds:.acetaminophen **OR** acetaminophen, LORazepam, morphine injection, ondansetron **OR** ondansetron (ZOFRAN) IV Medications Prior to Admission:  Prior to Admission medications   Medication Sig Start Date End Date Taking? Authorizing Provider  amLODipine (NORVASC) 5 MG tablet Take 1 tablet (5 mg total) by mouth daily. 12/31/12  Yes Vann, Jessica U, DO  fluticasone (FLONASE) 50 MCG/ACT nasal spray Place 1 spray into both nostrils daily. Patient taking differently: Place 1 spray into both nostrils daily as needed for allergies or rhinitis.  04/28/17  Yes Ghimire, Henreitta Leber, MD  latanoprost (XALATAN)  0.005 % ophthalmic solution Place 1 drop into the right eye at bedtime.   Yes [provider]  losartan (COZAAR) 50 MG tablet Take 50 mg by mouth daily. 04/29/17  Yes [provider]  vitamin C (ASCORBIC ACID) 500 MG tablet Take 1,000 mg by mouth daily.   Yes [provider]   No Known Allergies Review of Systems Non verbal  Physical Exam Elderly lady she is on the BiPAP Opens eyes to voice command Coarse breath sounds anteriorly S1-S2 Patient does not have peripheral edema Extremities warm to touch Abdomen is not distended  Vital Signs: BP 121/62   Pulse 81   Temp 99.2 F (37.3 C) (Axillary)   Resp 20   Ht _0  (1.6 m)   Wt 82.2 kg   SpO2 95%   BMI 32.10 kg/m  Pain Scale: 0-10   Pain Score: Asleep   SpO2: SpO2: 95 % O2 Device:SpO2: 95 % O2 Flow Rate: .O2 Flow Rate (L/min): 6 L/min  IO: Intake/output summary:   Intake/Output Summary (Last 24 hours) at 2018/04/24 0953 Last data filed at 04-24-2018 0600 Gross per 24 hour  Intake 314.89 ml  Output 600 ml  Net -285.11 ml    LBM: Last BM Date: 04/03/18 Baseline Weight: Weight: 81.6 kg Most recent weight: Weight: 82.2 kg     Palliative Assessment/Data:   PPS 30%  Time In:  8.30 Time Out:   9.40 Time Total:  70 min  Greater than 50%  of this time was spent counseling and coordinating care related to the above assessment and plan.  Signed by: Loistine Chance, MD [id number]  Please contact Palliative Medicine Team phone at [phone] for questions and concerns.  For individual provider: See Shea Evans

## 2018-04-29 NOTE — Procedures (Signed)
Intubation Procedure Note [name] [id number] 07-20-1928  Procedure: Intubation Indications: Respiratory insufficiency  Procedure Details Consent: Risks of procedure as well as the alternatives and risks of each were explained to the (patient/caregiver).  Consent for procedure obtained. Time Out: Verified patient identification, verified procedure, site/side was marked, verified correct patient position, special equipment/implants available, medications/allergies/relevent history reviewed, required imaging and test results available.  Performed  Maximum sterile technique was used including gloves, gown, hand hygiene and mask.  Miller size 3. Grade 1 view  Evaluation Hemodynamic Status: Transient hypotension treated with fluid; O2 sats: transiently fell during during procedure Patient's Current Condition: stable Complications: No apparent complications Patient did tolerate procedure well. Chest X-ray ordered to verify placement.  CXR: pending.   [name] 28-Apr-2018

## 2018-04-29 NOTE — Progress Notes (Addendum)
Chaplain providing support around comfort care / end of life.  Pt is surrounded by large extended family.  Pt's spouse at bedside.  Family expresses concern for supporting pt spouse and grandchildren after pt's death.   Family wishes to have last rites.  [place] is member at [place].  Chaplain contacted [place] office and spoke with Network engineer, who is working to notify priest.  Reported to family.   [facility] understands per physician that pt will move to comfort care within the next two hours.  [facility] will page chaplain 24 hour oncall with updates.  Follow up with family for continued support.     Jerene Pitch, MDiv, [facility]

## 2018-04-29 NOTE — Progress Notes (Signed)
Pt expired Dr. Vaughan Browner notified. Pulseless, no resp effort, pupils fixed and dilated. Death verified with Polly Cobia RN . Time of death 74.  Family at bedside.   [place] [facility] notified not suitable for donation of organs, tissues or eyes.  Fentanyl drip waste verified by Polly Cobia RN 210 cc   Morphine drip wasted (1mg /ml) 80 cc verified by Polly Cobia RN

## 2018-04-29 NOTE — Progress Notes (Addendum)
PCCM interval note  Patient intubated, central line placed for perisistent hypotension post intubation and CT scan done. Patient has been significantly agitated and in discomfort on vent requiring increasing doses of benzodiazepines and fentanyl drip. Repeat bedside ultrasound again did not show significant pockets of pleural effusion to do a thoracentesis safely.   Discussed with Dr. Stanford Breed, Cardiology and decision made to transfer to [facility] for pericardiocentesis However during the preparation for transfer I was called by the family including husband, children and grandchildren at 4:00 pm and informed that after discussions among themselves they have decided they do not want to transfer to [facility] or get any more procedures.  Through the day they have been distressed by her discomfort and agitation on the ventilator and feel that she has suffered enough and do not want to put her through more. This is obviously a difficult decision for the family but they feel this is the right thing to do for the patient. They have requested that we stop all interventions and transition to comfort care. Orders placed for morphine drip and terminal extubation from ventilator.   Marshell Garfinkel MD [facility] Pager [phone] If no answer call [phone] April 10, 2018, 4:06 PM

## 2018-04-29 NOTE — Plan of Care (Signed)
86 year old female with history of congestive heart failure CLL treated with acute respiratory failure hypoxic hypercapnic with pericardial effusion pleural effusion not responding to IV diuresis.  Patient has been on BiPAP overnight.  Patient's family revoked comfort care and now wants aggressive treatment.  Discussed with Dr. Stanford Breed Dr. Vaughan Browner and Dr. Josefina Do.  Appreciate their assistance.  Care transferred over to PCCM.  We will be happy to accept the patient back once stable and off of ventilator.

## 2018-04-29 NOTE — Progress Notes (Signed)
NAME:  [name], MRN:  [id number], DOB:  [birth year], LOS: 2 ADMISSION DATE:  04/13/2018, CONSULTATION DATE:  04/05/2018 REFERRING MD:  Loleta Books -[facility], CHIEF COMPLAINT:  dyspnea  Brief History   85 yo F with CLL and CHF lasix dependent at home, presenting 1/7 with dyspnea x 2 weeks. Admitted and eval'd by cardiology, hospitalist. PCCM asked to consult 1/8 for acute respiratory failure with hypoxia, hypercarbia in the setting of pleural, pericardial effusion, possible tamponade that has not responded to IV diuresis.  Past Medical History  CLL, CHF, HTN  Significant Hospital Events   1/7 Admitted for dyspnea  1/8 PCCM consulted after no clinical improvement with BiPAP, comfort care recommended 1/9 Family reversed code status after consultation with hematology, wants everything done  Consults:  Cardiology  PCCM  Procedures:    Significant Diagnostic Tests:  1/7 CXR> moderate pleural effusion, enlarged cardiac silhouette  1/8 ECHO> early sings of tamponade. LVEF 55-60%  Micro Data:  BCx 1/7>> MRSA 1/7> neg  Antimicrobials:  Azithromycin 1/7>> Rocephin 1/7>>  Interim history/subjective:  Worsening respiratory status. Mental status is worse. Maintained on BiPAP  Family met with Dr. Marin Olp, hematology today morning and are now requesting full scope of care including thoracentesis, pericardiocentesis.  Objective   Blood pressure (!) 132/42, pulse 79, temperature 99.2 F (37.3 C), temperature source Axillary, resp. rate (!) 21, height '5\' 3"'  (1.6 m), weight 82.2 kg, SpO2 96 %.        Intake/Output Summary (Last 24 hours) at Apr 29, 2018 1113 Last data filed at 29-Apr-2018 0600 Gross per 24 hour  Intake 314.89 ml  Output 600 ml  Net -285.11 ml   Filed Weights   04/03/2018 1237 04-29-18 0433  Weight: 81.6 kg 82.2 kg   Examination: Gen:      Critically ill, frail elderly HEENT:  EOMI, sclera anicteric Neck:     No masses; no thyromegaly Lungs:    Bilateral breath  sounds. CV:         Regular rate and rhythm; no murmurs Abd:      + bowel sounds; soft, non-tender; no palpable masses, no distension Ext:    No edema; adequate peripheral perfusion Skin:      Warm and dry; no rash Neuro: Sedated, nonresponsive.  Resolved Hospital Problem list     Assessment & Plan:   Acute Respiratory failure with hypoxemia and hypercapnia requiring positive pressure ventilation -Bilateral pleural effusions, pulmonary edema  P We examined the effusions with bedside ultrasound today.  They appear too small to do thoracentesis Plan on intubation. CT chest without contrast after intubation  Congestive Heart Failure -Lasix dependent at home with recent increased lasix requirment -Prior to presentation, 2 days without home lasix -Has received 160 Lasix and 63m metolazone, 5055mUOP P Holding diuresis as creatinine is higher.  Pericardial effusion P Discussed with Dr. CrStanford Breedcardiology. He feels that there is no clear evidence of tamponade Will consider pericardiocentesis if no clear etiology found for the fluid collections  Goals of Care Re discussed with family and sons.  If we are to continue full scope of care she would need intubation due to worsening respiratory status.  Agree with Dr. McLake Bellshat she is at high risk of death with cardiac arrest if we put her on positive pressure ventilation. However family is insistent on getting some answers. Our plan is to intubate, get CT chest.  If negative then proceed with pericardiocentesis.  If she does not improve with these interventions then family  will consider comfort care.  Made it clear that we should not do CPR or chest compression in case of cardiac arrest and the family agrees.  Best practice:  Diet: NPO Pain/Anxiety/Delirium protocol (if indicated): PRN tylenol, ativan VAP protocol (if indicated):  DVT prophylaxis: lovenox GI prophylaxis: protonix Glucose control: n/a Mobility: bedrest Code  Status: DNR/I Family Communication: Husband, 2 adult children at bedside.  Disposition: ICU  Labs   CBC: Recent Labs  Lab 04/17/2018 1341 04/05/18 0536  WBC 15.5* 16.4*  NEUTROABS 4.5  --   HGB 14.8 13.2  HCT 52.0* 45.4  MCV 95.1 93.4  PLT 152 974    Basic Metabolic Panel: Recent Labs  Lab 04/23/2018 1341 04/05/18 0536 Apr 28, 2018 0254  NA 138 136 138  K 5.2* 4.9 4.8  CL 100 99 99  CO2 31 31 32  GLUCOSE 123* 131* 98  BUN 33* 35* 38*  CREATININE 1.01* 1.00 1.27*  CALCIUM 9.9 9.5 9.3   GFR: Estimated Creatinine Clearance: 31.7 mL/min (A) (by C-G formula based on SCr of 1.27 mg/dL (H)). Recent Labs  Lab 03/29/2018 1341 04/05/18 0536 04-28-2018 0254  PROCALCITON  --  <0.10 <0.10  WBC 15.5* 16.4*  --     Liver Function Tests: Recent Labs  Lab 04/28/2018 1341  AST 21  ALT 21  ALKPHOS 57  BILITOT 0.8  PROT 6.6  ALBUMIN 4.4   No results for input(s): LIPASE, AMYLASE in the last 168 hours. No results for input(s): AMMONIA in the last 168 hours.  ABG    Component Value Date/Time   PHART 7.258 (L) 04/05/2018 1410   PCO2ART 74.2 (HH) 04/05/2018 1410   PO2ART 65.1 (L) 04/05/2018 1410   HCO3 32.0 (H) 04/05/2018 1410   O2SAT 91.4 04/05/2018 1410     Coagulation Profile: No results for input(s): INR, PROTIME in the last 168 hours.  Cardiac Enzymes: No results for input(s): CKTOTAL, CKMB, CKMBINDEX, TROPONINI in the last 168 hours.  HbA1C: No results found for: HGBA1C  CBG: No results for input(s): GLUCAP in the last 168 hours.  The patient is critically ill with multiple organ system failure and requires high complexity decision making for assessment and support, frequent evaluation and titration of therapies, advanced monitoring, review of radiographic studies and interpretation of complex data.   Critical Care Time devoted to patient care services, exclusive of separately billable procedures, described in this note is 35 minutes.   Marshell Garfinkel MD [facility] Pager [phone] If no answer call [phone] 2018/04/28, 11:30 AM

## 2018-04-29 DEATH — deceased

## 2019-11-01 IMAGING — RF DG SWALLOWING FUNCTION - NRPT [facility]
12 series · 24 of 24 positions shown · non-contrast
Comparison: none

[Series 1: cp_standard · 0.35mm/px · 2 of 58 frames shown (1 of 12)]
[frame 9/58]
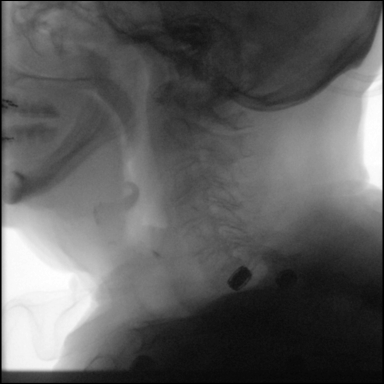
[frame 33/58]
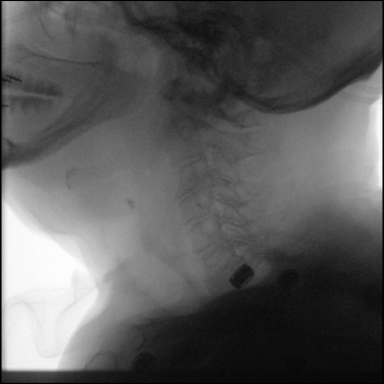

[Series 2: cp_standard · 0.35mm/px · 2 of 79 frames shown (2 of 12)]
[frame 12/79]
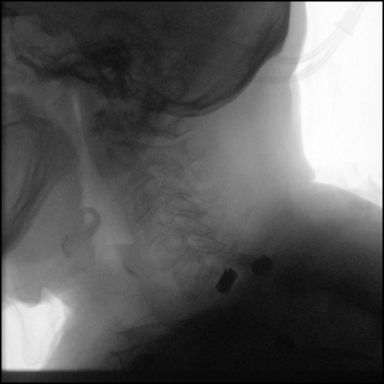
[frame 66/79]
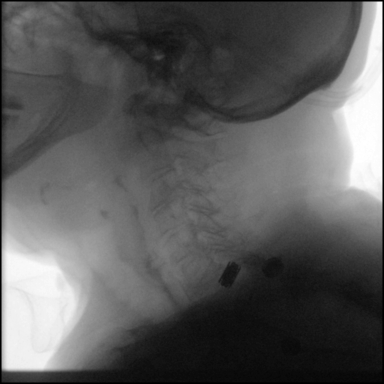

[Series 3: cp_standard · 0.34mm/px · 2 of 75 frames shown (3 of 12)]
[frame 12/75]
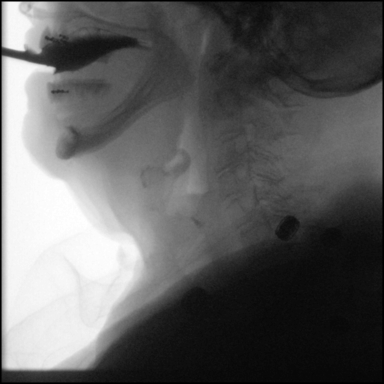
[frame 64/75]
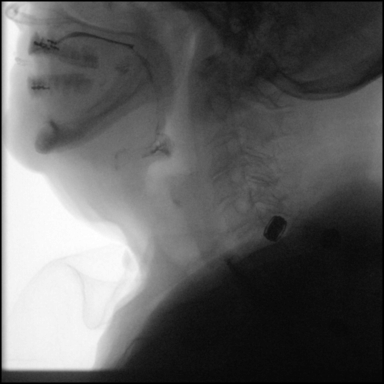

[Series 4: cp_standard · 0.34mm/px · 2 of 126 frames shown (4 of 12)]
[frame 19/126]
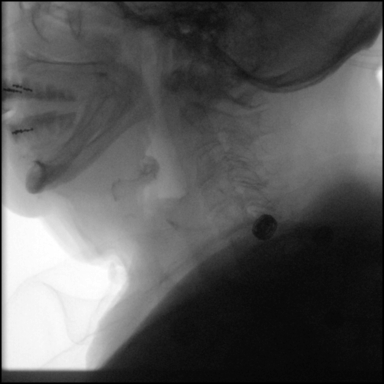
[frame 108/126]
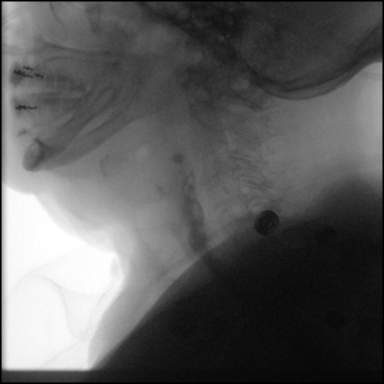

[Series 5: cp_standard · 0.34mm/px · 2 of 106 frames shown (5 of 12)]
[frame 16/106]
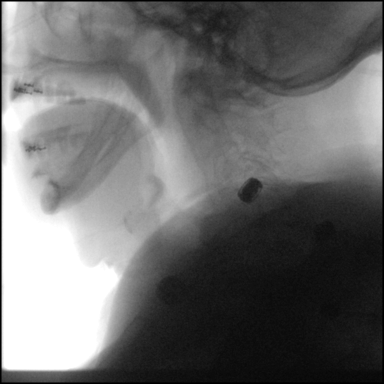
[frame 81/106]
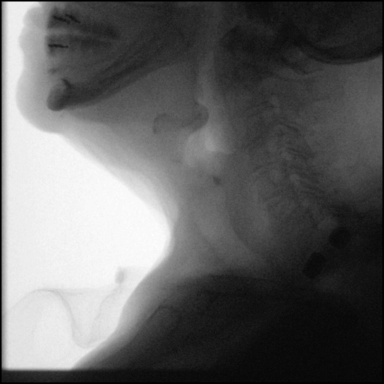

[Series 6: cp_standard · 0.34mm/px · 2 of 114 frames shown (6 of 12)]
[frame 18/114]
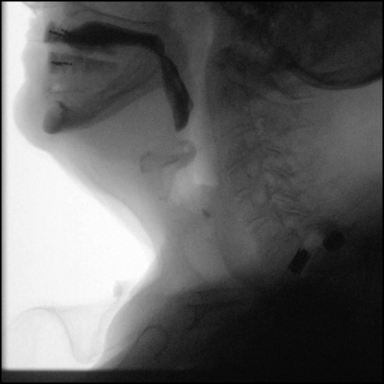
[frame 84/114]
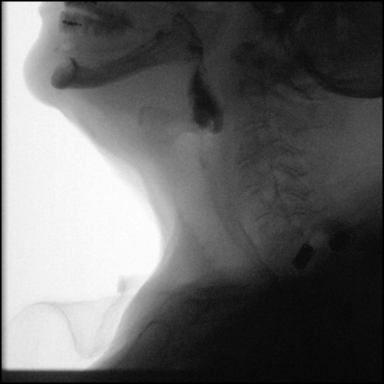

[Series 7: cp_standard · 0.34mm/px · 2 of 42 frames shown (7 of 12)]
[frame 7/42]
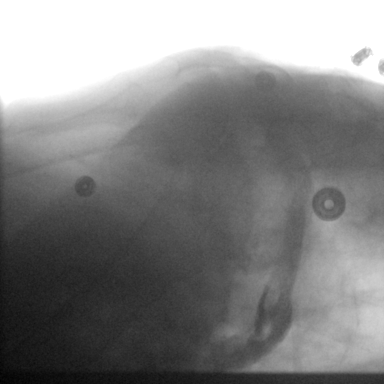
[frame 36/42]
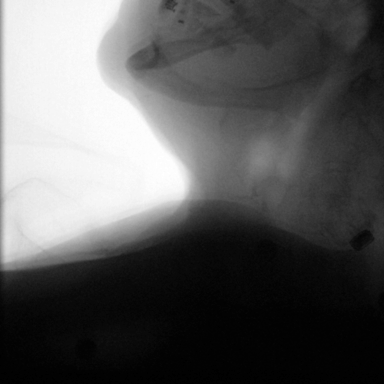

[Series 8: cp_standard · 0.34mm/px · 2 of 31 frames shown (8 of 12)]
[frame 2/31]
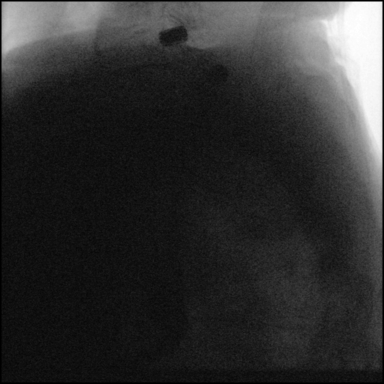
[frame 16/31]
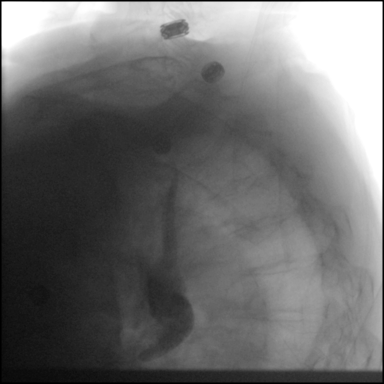

[Series 9: cp_standard · 0.34mm/px · 2 of 202 frames shown (9 of 12)]
[frame 31/202]
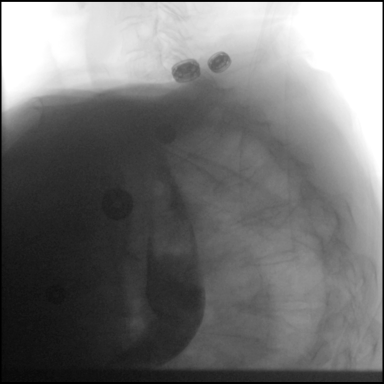
[frame 172/202]
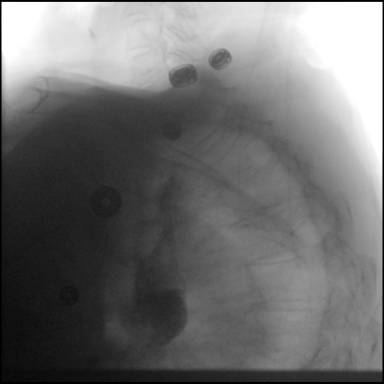

[Series 10: cp_standard · 0.34mm/px · 2 of 83 frames shown (10 of 12)]
[frame 42/83]
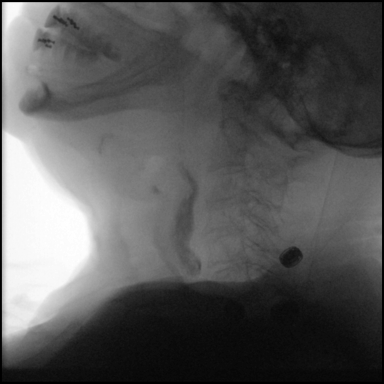
[frame 71/83]
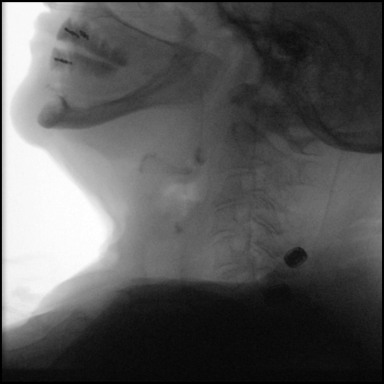

[Series 11: cp_standard · 0.35mm/px · 2 of 76 frames shown (11 of 12)]
[frame 12/76]
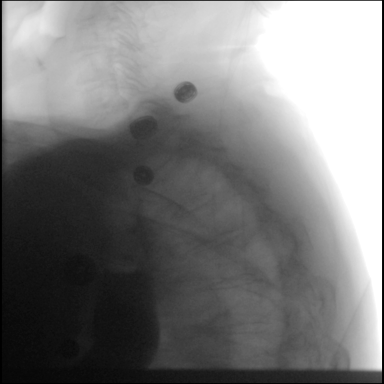
[frame 65/76]
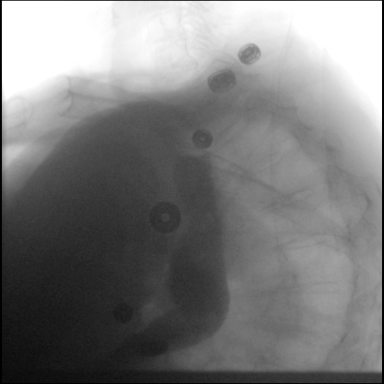

[Series 12: cp_standard · 0.35mm/px · 2 of 214 frames shown (12 of 12)]
[frame 108/214]
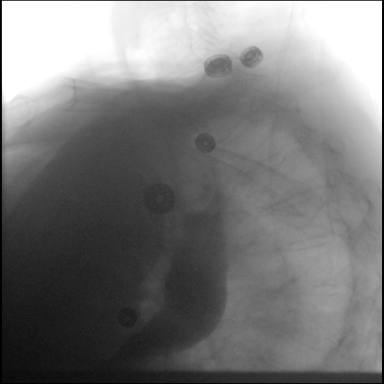
[frame 182/214]
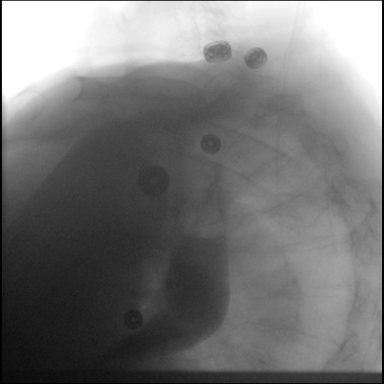

[24 of 24 positions shown; findings below may reference images not displayed]

FLUOROSCOPY FOR SWALLOWING FUNCTION STUDY:
Fluoroscopy was provided for swallowing function study, which was administered by a speech pathologist.  Final results and recommendations from this study are contained within the speech pathology report.
